# Patient Record
Sex: Male | Born: 1935 | Race: White | Hispanic: No | Marital: Single | Smoking: Former smoker
Health system: Southern US, Community
[De-identification: ages and names within clinical notes are randomized; demographics above are authoritative.]

## PROBLEM LIST (undated history)

## (undated) DIAGNOSIS — I10 Essential (primary) hypertension: Secondary | ICD-10-CM

## (undated) DIAGNOSIS — M6281 Muscle weakness (generalized): Secondary | ICD-10-CM

## (undated) DIAGNOSIS — G9341 Metabolic encephalopathy: Secondary | ICD-10-CM

## (undated) DIAGNOSIS — R2681 Unsteadiness on feet: Secondary | ICD-10-CM

## (undated) DIAGNOSIS — R41841 Cognitive communication deficit: Secondary | ICD-10-CM

## (undated) DIAGNOSIS — B9562 Methicillin resistant Staphylococcus aureus infection as the cause of diseases classified elsewhere: Secondary | ICD-10-CM

## (undated) DIAGNOSIS — F0390 Unspecified dementia without behavioral disturbance: Secondary | ICD-10-CM

## (undated) DIAGNOSIS — R2689 Other abnormalities of gait and mobility: Secondary | ICD-10-CM

## (undated) DIAGNOSIS — R7881 Bacteremia: Secondary | ICD-10-CM

## (undated) DIAGNOSIS — N400 Enlarged prostate without lower urinary tract symptoms: Secondary | ICD-10-CM

## (undated) DIAGNOSIS — U071 COVID-19: Secondary | ICD-10-CM

## (undated) HISTORY — PX: PACEMAKER INSERTION: SHX728

---

## 1998-11-05 ENCOUNTER — Ambulatory Visit (HOSPITAL_COMMUNITY): Admission: RE | Admit: 1998-11-05 | Discharge: 1998-11-05 | Payer: Self-pay | Admitting: Gastroenterology

## 2000-04-16 ENCOUNTER — Emergency Department (HOSPITAL_COMMUNITY): Admission: EM | Admit: 2000-04-16 | Discharge: 2000-04-16 | Payer: Self-pay | Admitting: Emergency Medicine

## 2008-11-24 ENCOUNTER — Emergency Department (HOSPITAL_COMMUNITY): Admission: EM | Admit: 2008-11-24 | Discharge: 2008-11-24 | Payer: Self-pay | Admitting: Emergency Medicine

## 2008-12-11 ENCOUNTER — Inpatient Hospital Stay (HOSPITAL_COMMUNITY): Admission: RE | Admit: 2008-12-11 | Discharge: 2008-12-16 | Payer: Self-pay | Admitting: Specialist

## 2010-05-27 LAB — COMPREHENSIVE METABOLIC PANEL
ALT: 19 U/L (ref 0–53)
Chloride: 91 mEq/L — ABNORMAL LOW (ref 96–112)
GFR calc Af Amer: >60 mL/min (ref >60)
Total Bilirubin: 1.3 mg/dL — ABNORMAL HIGH (ref 0.3–1.2)
Total Protein: 6.9 g/dL (ref 6.0–8.3)

## 2010-05-27 LAB — URINALYSIS, ROUTINE W REFLEX MICROSCOPIC
Bilirubin Urine: NEGATIVE
Bilirubin Urine: NEGATIVE
Glucose, UA: NEGATIVE mg/dL
Glucose, UA: NEGATIVE mg/dL
Glucose, UA: NEGATIVE mg/dL
Hgb urine dipstick: NEGATIVE
Hgb urine dipstick: NEGATIVE
Ketones, ur: 15 mg/dL — AB
Nitrite: NEGATIVE
Nitrite: NEGATIVE
Protein, ur: NEGATIVE mg/dL
Protein, ur: NEGATIVE mg/dL
Protein, ur: NEGATIVE mg/dL
Specific Gravity, Urine: 1.012 (ref 1.005–1.030)
Specific Gravity, Urine: 1.021 (ref 1.005–1.030)
Urobilinogen, UA: 4 mg/dL — ABNORMAL HIGH (ref 0.0–1.0)
Urobilinogen, UA: 4 mg/dL — ABNORMAL HIGH (ref 0.0–1.0)
Urobilinogen, UA: 4 mg/dL — ABNORMAL HIGH (ref 0.0–1.0)
pH: 7 (ref 5.0–8.0)

## 2010-05-27 LAB — BASIC METABOLIC PANEL WITH GFR
BUN: 8 mg/dL (ref 6–23)
CO2: 28 meq/L (ref 19–32)
Calcium: 9.6 mg/dL (ref 8.4–10.5)
Chloride: 95 meq/L — ABNORMAL LOW (ref 96–112)
Creatinine, Ser: 0.84 mg/dL (ref 0.4–1.5)
GFR calc non Af Amer: >60 mL/min
Glucose, Bld: 120 mg/dL — ABNORMAL HIGH (ref 70–99)
Potassium: 2.9 meq/L — ABNORMAL LOW (ref 3.5–5.1)
Sodium: 132 meq/L — ABNORMAL LOW (ref 135–145)

## 2010-05-27 LAB — URINE CULTURE
Colony Count: NO GROWTH
Colony Count: NO GROWTH

## 2010-05-27 LAB — URINE MICROSCOPIC-ADD ON

## 2010-05-27 LAB — CBC
HCT: 41.7 % (ref 39.0–52.0)
Hemoglobin: 14.5 g/dL (ref 13.0–17.0)
Hemoglobin: 15.4 g/dL (ref 13.0–17.0)
MCHC: 34.7 g/dL (ref 30.0–36.0)
MCV: 100.6 fL — ABNORMAL HIGH (ref 78.0–100.0)
MCV: 100.7 fL — ABNORMAL HIGH (ref 78.0–100.0)
Platelets: 285 10*3/uL (ref 150–400)
Platelets: 290 K/uL (ref 150–400)
RBC: 3.81 MIL/uL — ABNORMAL LOW (ref 4.22–5.81)
RBC: 4.15 MIL/uL — ABNORMAL LOW (ref 4.22–5.81)
RBC: 4.18 MIL/uL — ABNORMAL LOW (ref 4.22–5.81)
RBC: 4.5 MIL/uL (ref 4.22–5.81)
RDW: 12.2 % (ref 11.5–15.5)
RDW: 12.2 % (ref 11.5–15.5)
RDW: 12.6 % (ref 11.5–15.5)
WBC: 10.4 10*3/uL (ref 4.0–10.5)
WBC: 11.7 K/uL — ABNORMAL HIGH (ref 4.0–10.5)
WBC: 9.4 10*3/uL (ref 4.0–10.5)

## 2010-05-27 LAB — BASIC METABOLIC PANEL
BUN: 6 mg/dL (ref 6–23)
CO2: 30 mEq/L (ref 19–32)
CO2: 30 mEq/L (ref 19–32)
Calcium: 8.7 mg/dL (ref 8.4–10.5)
Calcium: 9.6 mg/dL (ref 8.4–10.5)
Chloride: 92 mEq/L — ABNORMAL LOW (ref 96–112)
Chloride: 92 mEq/L — ABNORMAL LOW (ref 96–112)
GFR calc Af Amer: >60 mL/min (ref >60)
GFR calc non Af Amer: >60 mL/min (ref >60)
GFR calc non Af Amer: >60 mL/min (ref >60)
Glucose, Bld: 127 mg/dL — ABNORMAL HIGH (ref 70–99)
Glucose, Bld: 127 mg/dL — ABNORMAL HIGH (ref 70–99)
Potassium: 2.9 mEq/L — ABNORMAL LOW (ref 3.5–5.1)
Potassium: 2.9 mEq/L — ABNORMAL LOW (ref 3.5–5.1)
Sodium: 129 mEq/L — ABNORMAL LOW (ref 135–145)

## 2010-05-27 LAB — DIFFERENTIAL
Eosinophils Absolute: 0.1 10*3/uL (ref 0.0–0.7)
Eosinophils Relative: 1 % (ref 0–5)
Lymphs Abs: 1.1 10*3/uL (ref 0.7–4.0)
Neutro Abs: 7 10*3/uL (ref 1.7–7.7)
Neutrophils Relative %: 75 % (ref 43–77)

## 2010-05-27 LAB — PROTIME-INR: Prothrombin Time: 13.2 seconds (ref 11.6–15.2)

## 2010-05-27 LAB — APTT: aPTT: 30 seconds (ref 24–37)

## 2010-05-27 LAB — ETHANOL: Alcohol, Ethyl (B): <5 mg/dL (ref 0–10)

## 2011-05-22 ENCOUNTER — Encounter (HOSPITAL_COMMUNITY): Payer: Self-pay

## 2011-05-22 ENCOUNTER — Emergency Department (HOSPITAL_COMMUNITY): Payer: Medicare Other

## 2011-05-22 ENCOUNTER — Emergency Department (HOSPITAL_COMMUNITY)
Admission: EM | Admit: 2011-05-22 | Discharge: 2011-05-22 | Disposition: A | Discharge status: Home / Self Care | Payer: Medicare Other | Attending: Emergency Medicine | Admitting: Emergency Medicine

## 2011-05-22 DIAGNOSIS — S82891A Other fracture of right lower leg, initial encounter for closed fracture: Secondary | ICD-10-CM

## 2011-05-22 DIAGNOSIS — I10 Essential (primary) hypertension: Secondary | ICD-10-CM | POA: Insufficient documentation

## 2011-05-22 DIAGNOSIS — Y9229 Other specified public building as the place of occurrence of the external cause: Secondary | ICD-10-CM | POA: Insufficient documentation

## 2011-05-22 DIAGNOSIS — S82899A Other fracture of unspecified lower leg, initial encounter for closed fracture: Secondary | ICD-10-CM | POA: Insufficient documentation

## 2011-05-22 DIAGNOSIS — Z87891 Personal history of nicotine dependence: Secondary | ICD-10-CM | POA: Insufficient documentation

## 2011-05-22 DIAGNOSIS — R296 Repeated falls: Secondary | ICD-10-CM | POA: Insufficient documentation

## 2011-05-22 HISTORY — DX: Essential (primary) hypertension: I10

## 2011-05-22 MED ORDER — HYDROCODONE-ACETAMINOPHEN 5-325 MG PO TABS: 1.0000 | ORAL_TABLET | ORAL | Status: AC | PRN

## 2011-05-22 NOTE — Discharge Instructions (Signed)
Take vicodin as prescribed for severe pain.   Do not drive within four hours of taking this medication (may cause drowsiness or confusion).   Ice 2-3 times a day for 15-20 minutes and elevate when possible.  No weight-bearing.  Follow up with Dr. Shelle Iron this week.   You may return to the ER if symptoms worsen or you have any other concerns.   Ankle Fracture A fracture is a break in the bone. A cast or splint is used to protect and keep your injured bone from moving.  HOME CARE INSTRUCTIONS   Use your crutches as directed.   To lessen the swelling, keep the injured leg elevated while sitting or lying down.   Apply ice to the injury for 15 to 20 minutes, 3 to 4 times per day while awake for 2 days. Put the ice in a plastic bag and place a thin towel between the bag of ice and your cast.   If you have a plaster or fiberglass cast:   Do not try to scratch the skin under the cast using sharp or pointed objects.   Check the skin around the cast every day. You may put lotion on any red or sore areas.   Keep your cast dry and clean.   If you have a plaster splint:   Wear the splint as directed.   You may loosen the elastic around the splint if your toes become numb, tingle, or turn cold or blue.   Do not put pressure on any part of your cast or splint; it may break. Rest your cast only on a pillow the first 24 hours until it is fully hardened.   Your cast or splint can be protected during bathing with a plastic bag. Do not lower the cast or splint into water.   Take medications as directed by your caregiver. Only take over-the-counter or prescription medicines for pain, discomfort, or fever as directed by your caregiver.   Do not drive a vehicle until your caregiver specifically tells you it is safe to do so.   If your caregiver has given you a follow-up appointment, it is very important to keep that appointment. Not keeping the appointment could result in a chronic or permanent injury, pain,  and disability. If there is any problem keeping the appointment, you must call back to this facility for assistance.  SEEK IMMEDIATE MEDICAL CARE IF:   Your cast gets damaged or breaks.   You have continued severe pain or more swelling than you did before the cast was put on.   Your skin or toenails below the injury turn blue or gray, or feel cold or numb.   There is a bad smell or new stains and/or purulent (pus like) drainage coming from under the cast.  If you do not have a window in your cast for observing the wound, a discharge or minor bleeding may show up as a stain on the outside of your cast. Report these findings to your caregiver. MAKE SURE YOU:   Understand these instructions.   Will watch your condition.   Will get help right away if you are not doing well or get worse.  Document Released: 02/05/2000 Document Revised: 01/27/2011 Document Reviewed: 09/11/2007 [facility] Patient Information 2012 [place], [place].

## 2011-05-22 NOTE — ED Notes (Signed)
Pt report stepping in a hole twisting rt ankle- no other complaints

## 2011-05-22 NOTE — ED Notes (Signed)
Ortho tech called 

## 2011-05-22 NOTE — ED Provider Notes (Signed)
Medical screening examination/treatment/procedure(s) were performed by non-physician practitioner and as supervising physician I was immediately available for consultation/collaboration.   Dayton Bailiff, MD 05/22/11 2306

## 2011-05-22 NOTE — ED Provider Notes (Signed)
History     CSN: [id number]  Arrival date & time 05/22/11  4401   First MD Initiated Contact with Patient 05/22/11 2028      Chief Complaint  Patient presents with  . Fall  . Ankle Pain    (Consider location/radiation/quality/duration/timing/severity/associated sxs/prior treatment) HPI History provided by pt.   Pt stepped into a hole w/ right foot while walking out of church at 8:30am today and fell, landing on his knees.  Did not hit head and denies neck, back and knee pain.  Has pain at right medial ankle only.  Aggravated by ROM and is unable to bear weight.  Has not taken anything for pain.  No associated paresthesias.  Per prior chart, Dr. Shelle Iron performed ORIF for right bimalleolar fx in 2010.    Past Medical History  Diagnosis Date  . Hypertension     No past surgical history on file.  No family history on file.  History  Substance Use Topics  . Smoking status: Former Games developer  . Smokeless tobacco: Not on file  . Alcohol Use: No      Review of Systems  All other systems reviewed and are negative.    Allergies  Review of patient's allergies indicates no known allergies.  Home Medications  No current outpatient prescriptions on file.  BP 134/71  Pulse 82  Temp(Src) 98.7 F (37.1 C) (Oral)  Resp 18  Ht 6\' 1"  (1.854 m)  Wt 220 lb (99.791 kg)  BMI 29.03 kg/m2  SpO2 100%  Physical Exam  Nursing note and vitals reviewed. Constitutional: He is oriented to person, place, and time. He appears well-developed and well-nourished. No distress.  HENT:  Head: Normocephalic and atraumatic.  Eyes:       Normal appearance  Neck: Normal range of motion.  Musculoskeletal:       Right ankle w/out deformity or ecchymosis.  Edema and tenderness w/ guarding of medial malleolus.  Pain w/ minimal passive ROM.  2+ DP pulse and distal sensation intact.   Neurological: He is alert and oriented to person, place, and time.  Psychiatric: He has a normal mood and affect. His  behavior is normal.    ED Course  Procedures (including critical care time)  Labs Reviewed - No data to display Dg Ankle Complete Right  05/22/2011  *RADIOLOGY REPORT*  Clinical Data: Larey Seat.  History of previous ankle fractures.  RIGHT ANKLE - COMPLETE 3+ VIEW  Comparison: 11/24/2008.  Findings: There is a long side plate and multiple screws on the fibula.  No complicating features or acute fracture.  There is moderate lateral soft tissue swelling.  There is an old healed fracture of the medial malleolus at the level of the ankle mortise. Findings suspicious for a new acute fracture above the mortise with medial mortise widening.  Remote healed calcaneal fractures are again demonstrated.  IMPRESSION:  1.  Remote healed fractures of the medial malleolus, lateral malleolus and calcaneus. 2.  Acute nondisplaced intra-articular fracture of the medial malleolus above the ankle mortise with mild medial mortise widening.  Original Report Authenticated By: P. Loralie Champagne, M.D.     1. Fracture of right ankle       MDM  Pt presents w/ fx of left lateral malleolus secondary to mechanical fall.  Dr. Shelle Iron has performed ORIF of this ankle in the past.  Ortho tech splinted (short leg and stirrup).  Pt will borrow a walker from a friend.  D/c'd home w/ vicodin for pain and referral  back to Dr. Shelle Iron.  Recommended ice, elevation and non-weight-bearing.          Adam Conner, [place] 05/22/11 2111

## 2012-07-21 ENCOUNTER — Emergency Department (HOSPITAL_COMMUNITY): Payer: Medicare PPO

## 2012-07-21 ENCOUNTER — Emergency Department (HOSPITAL_COMMUNITY)
Admission: EM | Admit: 2012-07-21 | Discharge: 2012-07-21 | Disposition: A | Discharge status: Home / Self Care | Payer: Medicare PPO | Attending: Emergency Medicine | Admitting: Emergency Medicine

## 2012-07-21 DIAGNOSIS — I959 Hypotension, unspecified: Secondary | ICD-10-CM | POA: Insufficient documentation

## 2012-07-21 DIAGNOSIS — S0081XA Abrasion of other part of head, initial encounter: Secondary | ICD-10-CM

## 2012-07-21 DIAGNOSIS — R4789 Other speech disturbances: Secondary | ICD-10-CM | POA: Insufficient documentation

## 2012-07-21 DIAGNOSIS — S0990XA Unspecified injury of head, initial encounter: Secondary | ICD-10-CM

## 2012-07-21 DIAGNOSIS — F1020 Alcohol dependence, uncomplicated: Secondary | ICD-10-CM | POA: Insufficient documentation

## 2012-07-21 DIAGNOSIS — Y998 Other external cause status: Secondary | ICD-10-CM | POA: Insufficient documentation

## 2012-07-21 DIAGNOSIS — R296 Repeated falls: Secondary | ICD-10-CM | POA: Insufficient documentation

## 2012-07-21 DIAGNOSIS — I10 Essential (primary) hypertension: Secondary | ICD-10-CM | POA: Insufficient documentation

## 2012-07-21 DIAGNOSIS — F10220 Alcohol dependence with intoxication, uncomplicated: Secondary | ICD-10-CM

## 2012-07-21 DIAGNOSIS — Y929 Unspecified place or not applicable: Secondary | ICD-10-CM | POA: Insufficient documentation

## 2012-07-21 DIAGNOSIS — Z87891 Personal history of nicotine dependence: Secondary | ICD-10-CM | POA: Insufficient documentation

## 2012-07-21 DIAGNOSIS — IMO0002 Reserved for concepts with insufficient information to code with codable children: Secondary | ICD-10-CM | POA: Insufficient documentation

## 2012-07-21 DIAGNOSIS — S60512A Abrasion of left hand, initial encounter: Secondary | ICD-10-CM

## 2012-07-21 DIAGNOSIS — Z23 Encounter for immunization: Secondary | ICD-10-CM | POA: Insufficient documentation

## 2012-07-21 MED ORDER — TETANUS-DIPHTH-ACELL PERTUSSIS 5-2.5-18.5 LF-MCG/0.5 IM SUSP
0.5000 mL | Freq: Once | INTRAMUSCULAR | Status: AC
  Administered 2012-07-21: 0.5 mL via INTRAMUSCULAR
  Filled 2012-07-21: qty 0.5

## 2012-07-21 NOTE — ED Notes (Signed)
Reminded pt not get out of bed without assistance. Bed at lowest point. Call bell in pts hand. Door open. Will continue to monitor.

## 2012-07-21 NOTE — ED Notes (Signed)
Per EMS, pt was walking to go pick up roommate and fell. He did not LOC. Denies any pain. Abrasion  On bridge of nose. Skin tear on forehead and knuckles. CBG 101. BP 87/53, 70. No pain in neck or back per palpation.

## 2012-07-21 NOTE — ED Provider Notes (Signed)
History     CSN: [id number]  Arrival date & time 07/21/12  1628   First MD Initiated Contact with Patient 07/21/12 1700      Chief Complaint  Patient presents with  . Fall  . Alcohol Intoxication    (Consider location/radiation/quality/duration/timing/severity/associated sxs/prior treatment) HPI Comments: Patient with h/o alcoholism had a witnessed fall without LOC per EMS. Patient currently denies any pain except for a head and left hand where he sustained abrasions. Per EMS, patient was hypotensive upon arrival. This improved with fluids. Patient denies blurry vision, nausea, vomiting, abdominal pain. He denies neck pain. Onset of symptoms acute. Course is constant. Nothing makes symptoms better or worse.  The history is provided by the patient and the EMS personnel.    Past Medical History  Diagnosis Date  . Hypertension     No past surgical history on file.  No family history on file.  History  Substance Use Topics  . Smoking status: Former Games developer  . Smokeless tobacco: Not on file  . Alcohol Use: No      Review of Systems  Constitutional: Negative for fatigue.  HENT: Negative for neck pain and tinnitus.   Eyes: Negative for photophobia, pain and visual disturbance.  Respiratory: Negative for shortness of breath.   Cardiovascular: Negative for chest pain.  Gastrointestinal: Negative for nausea and vomiting.  Musculoskeletal: Negative for back pain and gait problem.  Skin: Positive for wound.  Neurological: Negative for dizziness, weakness, light-headedness, numbness and headaches.  Psychiatric/Behavioral: Negative for confusion and decreased concentration.    Allergies  Review of patient's allergies indicates no known allergies.  Home Medications   Current Outpatient Rx  Name  Route  Sig  Dispense  Refill  . PRESCRIPTION MEDICATION   Oral   Take 1 tablet by mouth daily.         Marland Kitchen PRESCRIPTION MEDICATION   Oral   Take 1 tablet by mouth daily.          Marland Kitchen PRESCRIPTION MEDICATION   Oral   Take 1 tablet by mouth daily.         Marland Kitchen PRESCRIPTION MEDICATION   Oral   Take 1 tablet by mouth daily.           There were no vitals taken for this visit.  Physical Exam  Nursing note and vitals reviewed. Constitutional: He is oriented to person, place, and time. He appears well-developed and well-nourished.  HENT:  Head: Normocephalic. Head is without raccoon's eyes and without Battle's sign.  Right Ear: Tympanic membrane, external ear and ear canal normal. No hemotympanum.  Left Ear: Tympanic membrane, external ear and ear canal normal. No hemotympanum.  Nose: Nose normal. No nasal septal hematoma.  Mouth/Throat: Oropharynx is clear and moist. No oropharyngeal exudate.  Scattered abrasions of forehead and bridge of nose with a small avulsion.   Eyes: Conjunctivae, EOM and lids are normal. Pupils are equal, round, and reactive to light.  No visible hyphema  Neck: Normal range of motion. Neck supple.  Cardiovascular: Normal rate and regular rhythm.   Pulmonary/Chest: Effort normal and breath sounds normal.  Abdominal: Soft. There is no tenderness.  Musculoskeletal: Normal range of motion.       Cervical back: He exhibits normal range of motion, no tenderness and no bony tenderness.       Thoracic back: He exhibits no tenderness and no bony tenderness.       Lumbar back: He exhibits no tenderness and no bony tenderness.  Neurological: He is alert and oriented to person, place, and time. He has normal strength and normal reflexes. No cranial nerve deficit or sensory deficit. Coordination normal. GCS eye subscore is 4. GCS verbal subscore is 5. GCS motor subscore is 6.  Slurred speech consistent with alcohol intoxication. Patient is alert.  Skin: Skin is warm and dry.  Mild abrasions to the dorsum of his left hand and fingers.  Psychiatric:  Patient is intoxicated but cooperative.    ED Course  Procedures (including critical care  time)  Labs Reviewed - No data to display Ct Head Wo Contrast  07/21/2012   *RADIOLOGY REPORT*  Clinical Data:  Fall.  Alcohol intoxication. Abrasions to forehead and nose.  CT HEAD WITHOUT CONTRAST CT CERVICAL SPINE WITHOUT CONTRAST  Technique:  Multidetector CT imaging of the head and cervical spine was performed following the standard protocol without intravenous contrast.  Multiplanar CT image reconstructions of the cervical spine were also generated.  Comparison:  None available.  CT HEAD  Findings: A right frontal scalp hematoma is present.  There is no underlying fracture.  This the globes and orbits are intact.  The paranasal sinuses and mastoid air cells are clear.  Moderate generalized atrophy is present.  Periventricular and subcortical white matter hypoattenuation is present bilaterally. This the ventricles are proportionate to the degree of atrophy.  No significant extra-axial fluid collection is present.  IMPRESSION:  1.  Moderate atrophy white matter disease likely reflects the sequelae of chronic microvascular ischemia. 2.  No acute intracranial abnormality. 3.  Right frontal scalp hematoma without an underlying fracture.  CT CERVICAL SPINE  Findings: The cervical spine is imaged from the skull base through T1-2.  There is fusion of anterior osteophytes at C5-6 and C6-7, more prominent on the right.  Chronic end plate degenerative change and uncovertebral spurring is evident from C3-4 through C6-7. Multilevel facet degenerative changes are most evident on the right at C3-4.  Atherosclerotic calcifications are present within the carotid bifurcations bilaterally.  Atherosclerotic calcifications are present in the subclavian arteries bilaterally as well.  The lung apices are clear.  IMPRESSION:  1.  No acute fracture or traumatic subluxation. 2.  Moderate degenerative changes of the cervical spine as described. 3.  Atherosclerosis.   Original Report Authenticated By: Marin Roberts, M.D.   Ct  Cervical Spine Wo Contrast  07/21/2012   *RADIOLOGY REPORT*  Clinical Data:  Fall.  Alcohol intoxication. Abrasions to forehead and nose.  CT HEAD WITHOUT CONTRAST CT CERVICAL SPINE WITHOUT CONTRAST  Technique:  Multidetector CT imaging of the head and cervical spine was performed following the standard protocol without intravenous contrast.  Multiplanar CT image reconstructions of the cervical spine were also generated.  Comparison:  None available.  CT HEAD  Findings: A right frontal scalp hematoma is present.  There is no underlying fracture.  This the globes and orbits are intact.  The paranasal sinuses and mastoid air cells are clear.  Moderate generalized atrophy is present.  Periventricular and subcortical white matter hypoattenuation is present bilaterally. This the ventricles are proportionate to the degree of atrophy.  No significant extra-axial fluid collection is present.  IMPRESSION:  1.  Moderate atrophy white matter disease likely reflects the sequelae of chronic microvascular ischemia. 2.  No acute intracranial abnormality. 3.  Right frontal scalp hematoma without an underlying fracture.  CT CERVICAL SPINE  Findings: The cervical spine is imaged from the skull base through T1-2.  There is fusion of anterior osteophytes at  C5-6 and C6-7, more prominent on the right.  Chronic end plate degenerative change and uncovertebral spurring is evident from C3-4 through C6-7. Multilevel facet degenerative changes are most evident on the right at C3-4.  Atherosclerotic calcifications are present within the carotid bifurcations bilaterally.  Atherosclerotic calcifications are present in the subclavian arteries bilaterally as well.  The lung apices are clear.  IMPRESSION:  1.  No acute fracture or traumatic subluxation. 2.  Moderate degenerative changes of the cervical spine as described. 3.  Atherosclerosis.   Original Report Authenticated By: Marin Roberts, M.D.     1. Head injury, acute, initial  encounter   2. Facial abrasion, initial encounter   3. Hand abrasion, left, initial encounter   4. Alcohol intoxication in active alcoholic, uncomplicated     5:14 PM Patient seen and examined. Work-up initiated. Medications ordered.   Vital signs reviewed and are as follows: Filed Vitals:   07/21/12 1745  BP: 103/55  Pulse: 61   6:49 PM CT scans reviewed by myself. They are negative.  Patient was counseled on head injury precautions and symptoms that should indicate their return to the ED.  These include severe worsening headache, vision changes, confusion, loss of consciousness, trouble walking, nausea & vomiting, or weakness/tingling in extremities.    Pt urged to return with worsening pain, worsening swelling, expanding area of redness or streaking up extremity, fever, or any other concerns. Pt verbalizes understanding and agrees with plan.  Friends are at bedside and will take patient home. They were also given return instructions.    MDM  Head injury and abrasions after fall due to alcohol intoxication. No wounds requiring suture or other repair. Head CT and neck CT performed showing no acute injuries.         Renne Crigler, PA-C 07/21/12 [phone]

## 2012-07-21 NOTE — ED Notes (Signed)
Rechecked HR and it 56 manually. Will continue to monitor.

## 2012-07-21 NOTE — ED Provider Notes (Signed)
Medical screening examination/treatment/procedure(s) were conducted as a shared visit with non-physician practitioner(s) and myself.  I personally evaluated the patient during the encounter  Pt with fall, is intoxicated.  Has remained awake, conversant.  Non focal neuro deficits, no N/V here.  Will obtain head and cervical spine CT's, perform basic wound care for multiple facial abrasions. If CT's reveal no sig trauma, will be able to discharge to home with family.      Gavin Pound. Oletta Lamas, MD 07/21/12 1610

## 2020-03-11 ENCOUNTER — Other Ambulatory Visit: Payer: Self-pay

## 2020-03-11 ENCOUNTER — Encounter (HOSPITAL_COMMUNITY): Payer: Self-pay

## 2020-03-11 ENCOUNTER — Emergency Department (HOSPITAL_COMMUNITY)
Admission: EM | Admit: 2020-03-11 | Discharge: 2020-03-11 | Disposition: A | Discharge status: Home / Self Care | Payer: Medicare PPO | Attending: Emergency Medicine | Admitting: Emergency Medicine

## 2020-03-11 ENCOUNTER — Emergency Department (HOSPITAL_COMMUNITY): Payer: Medicare PPO

## 2020-03-11 DIAGNOSIS — U071 COVID-19: Secondary | ICD-10-CM

## 2020-03-11 DIAGNOSIS — Z79899 Other long term (current) drug therapy: Secondary | ICD-10-CM | POA: Diagnosis not present

## 2020-03-11 DIAGNOSIS — I10 Essential (primary) hypertension: Secondary | ICD-10-CM | POA: Insufficient documentation

## 2020-03-11 DIAGNOSIS — R0981 Nasal congestion: Secondary | ICD-10-CM | POA: Diagnosis present

## 2020-03-11 DIAGNOSIS — Z87891 Personal history of nicotine dependence: Secondary | ICD-10-CM | POA: Insufficient documentation

## 2020-03-11 DIAGNOSIS — Z7982 Long term (current) use of aspirin: Secondary | ICD-10-CM | POA: Diagnosis not present

## 2020-03-11 DIAGNOSIS — F0390 Unspecified dementia without behavioral disturbance: Secondary | ICD-10-CM | POA: Diagnosis not present

## 2020-03-11 NOTE — ED Notes (Signed)
[facility] reports he is #4 on their list

## 2020-03-11 NOTE — ED Notes (Signed)
Called pt daughter on file to pick pt up. Pt daughter states that she lives in [place]. She requests that pt be transported by ambulance. [facility] called to transport pt back home

## 2020-03-11 NOTE — Discharge Instructions (Addendum)
You have been evaluated for possible complications from COVID infection.  Fortunately your chest x-ray is normal today, oxygen level is normal as well.  Please follow instruction below for further care.  Recommendations for at home COVID-19 symptoms management:  Please continue isolation at home. Call [phone] to see whether you might be eligible for therapeutic antibody infusions (leave your name and they will call you back).  If have acute worsening of symptoms please go to ER/urgent care for further evaluation. Check pulse oximetry and if below 90-92% please go to ER. The following supplements MAY help:  Vitamin C 500mg  twice a day and Quercetin 250-500 mg twice a day Vitamin D3 2000 - 4000 u/day B Complex vitamins Zinc 75-100 mg/day Melatonin 6-10 mg at night (the optimal dose is unknown) Aspirin 81mg /day (if no history of bleeding issues)

## 2020-03-11 NOTE — ED Notes (Signed)
[facility] has been called for transport. 

## 2020-03-11 NOTE — ED Triage Notes (Signed)
Per EMS, Pt, from home, after family felt they heard fluid in his lungs.  Pt denies complaints.  Pt is COVID+ x 5 days.  Lungs were clear w/ EMS.  Some congestion noted.  Vitals WDL.  Pt is A & Ox2 at baseline. Hx of dementia.

## 2020-03-11 NOTE — ED Provider Notes (Signed)
[facility]-EMERGENCY DEPT Provider Note   CSN: [id number] Arrival date & time: 03/11/20  1042     History Chief Complaint  Patient presents with  . Covid Positive  . Nasal Congestion    Adam Conner is a 87 y.o. male.  The history is provided by the patient, the EMS personnel and medical records. No language interpreter was used.     87 year old male with history of hypertension, dementia, brought here via EMS from home with concerns of COVID symptoms.  Per EMS note, patient was diagnosed with COVID for the past 5 days.  And family member heard "fluid in his lungs" and was concerned.  However, when questioned, patient at this time does not have any complaint.  He does not complain of any fever chills no cough no trouble breathing no pain in his chest no nauseous or vomiting no loss of taste or smell.  He does not have any specific complaint.  He is unsure if he has been vaccinated or not.  Past Medical History:  Diagnosis Date  . Hypertension     There are no problems to display for this patient.    The histories are not reviewed yet. Please review them in the "History" navigator section and refresh this SmartLink.     No family history on file.  Social History   Tobacco Use  . Smoking status: Former Smoker  Substance Use Topics  . Alcohol use: No  . Drug use: No    Home Medications Prior to Admission medications   Medication Sig Start Date End Date Taking? Authorizing Provider  aspirin EC 81 MG tablet Take 81 mg by mouth daily.    [provider]  lisinopril (PRINIVIL,ZESTRIL) 5 MG tablet Take 5 mg by mouth daily.    [provider]  PRESCRIPTION MEDICATION Take 1 tablet by mouth daily.    [provider]  PRESCRIPTION MEDICATION Take 1 tablet by mouth daily.    [provider]  PRESCRIPTION MEDICATION Take 1 tablet by mouth daily.    [provider]  PRESCRIPTION MEDICATION Take 1 tablet by mouth  daily.    [provider]  tamsulosin (FLOMAX) 0.4 MG CAPS Take 0.4 mg by mouth daily after breakfast.    [provider]    Allergies    Patient has no known allergies.  Review of Systems   Review of Systems  Unable to perform ROS: Dementia    Physical Exam Updated Vital Signs BP (!) 162/86 (BP Location: Left Arm)   Pulse 79   Temp 98.2 F (36.8 C) (Oral)   Resp 16   SpO2 98%   Physical Exam Vitals and nursing note reviewed.  Constitutional:      General: He is not in acute distress.    Appearance: He is well-developed and well-nourished. He is obese.     Comments: Elderly male sitting in the chair appears to be in no acute discomfort  HENT:     Head: Atraumatic.  Eyes:     Conjunctiva/sclera: Conjunctivae normal.  Cardiovascular:     Rate and Rhythm: Normal rate and regular rhythm.     Pulses: Normal pulses.     Heart sounds: Normal heart sounds.  Pulmonary:     Effort: Pulmonary effort is normal.     Breath sounds: Normal breath sounds. No wheezing, rhonchi or rales.  Abdominal:     Palpations: Abdomen is soft.     Tenderness: There is no abdominal tenderness.  Musculoskeletal:     Cervical back: Neck supple.  Skin:    Findings: No rash.  Neurological:     Mental Status: He is alert.     GCS: GCS eye subscore is 4. GCS verbal subscore is 5. GCS motor subscore is 6.     Motor: Motor function is intact.     Comments: Alert to self, month but unable to tell place, year, or situation.  Psychiatric:        Mood and Affect: Mood and affect normal.     ED Results / Procedures / Treatments   Labs (all labs ordered are listed, but only abnormal results are displayed) Labs Reviewed - No data to display  EKG None  Radiology DG Chest Portable 1 View  Result Date: 03/11/2020 CLINICAL DATA:  Congestion. The patient tested positive for COVID-19. EXAM: PORTABLE CHEST 1 VIEW COMPARISON:  PA and lateral chest 12/10/2008. FINDINGS: Lung volumes are  low with mild basilar atelectasis. No consolidative process, pneumothorax or effusion. Heart size is normal. Aortic atherosclerosis is noted. IMPRESSION: No acute disease in a low volume chest. Aortic Atherosclerosis (ICD10-I70.0). Electronically Signed   By: Drusilla Kanner M.D.   On: 03/11/2020 11:39    Procedures Procedures (including critical care time)  Medications Ordered in ED Medications - No data to display  ED Course  I have reviewed the triage vital signs and the nursing notes.  Pertinent labs & imaging results that were available during my care of the patient were reviewed by me and considered in my medical decision making (see chart for details).    MDM Rules/Calculators/A&P                          BP (!) 162/86 (BP Location: Left Arm)   Pulse 79   Temp 98.2 F (36.8 C) (Oral)   Resp 16   SpO2 98%   Final Clinical Impression(s) / ED Diagnoses Final diagnoses:  COVID-19 virus infection    Rx / DC Orders ED Discharge Orders    None     11:36 AM Please report the patient previously test positive for COVID infection several days prior sent here because family members concerned that he is having trouble breathing because that was sounds in his lungs.  On exam, patient is well-appearing in no acute discomfort and speaking in complete sentences and does not appear to be having any respiratory distress.  Lungs are clear on auscultation.  Vital signs stable, no hypoxia.  No COVID test result in our system.  Chest x-ray ordered.  11:49 AM Chest x-ray obtained today show no acute finding.  I did attempt to reach out to patient's family member through the phone without any success.  However, in the event of a normal chest x-ray, no hypoxia, and patient is well-appearing, he is stable for discharge.  I did provide post COVID care instruction. Return precaution given.   Adam Conner was evaluated in Emergency Department on 03/11/2020 for the symptoms described in the history  of present illness. He was evaluated in the context of the global COVID-19 pandemic, which necessitated consideration that the patient might be at risk for infection with the SARS-CoV-2 virus that causes COVID-19. Institutional protocols and algorithms that pertain to the evaluation of patients at risk for COVID-19 are in a state of rapid change based on information released by regulatory bodies including the CDC and federal and state organizations. These policies and algorithms were followed  during the patient's care in the ED.    Fayrene Helper, PA-C 03/11/20 1159    Arby Barrette, MD 03/11/20 [phone]

## 2020-04-16 ENCOUNTER — Encounter (HOSPITAL_COMMUNITY): Payer: Self-pay | Admitting: Emergency Medicine

## 2020-04-16 ENCOUNTER — Other Ambulatory Visit: Payer: Self-pay

## 2020-04-16 ENCOUNTER — Inpatient Hospital Stay (HOSPITAL_COMMUNITY)
Admission: EM | Admit: 2020-04-16 | Discharge: 2020-04-24 | DRG: 314 | Disposition: A | Discharge status: Skilled Nursing Facility | Payer: Medicare PPO | Attending: Internal Medicine | Admitting: Internal Medicine

## 2020-04-16 ENCOUNTER — Emergency Department (HOSPITAL_COMMUNITY): Payer: Medicare PPO

## 2020-04-16 DIAGNOSIS — A419 Sepsis, unspecified organism: Secondary | ICD-10-CM | POA: Diagnosis not present

## 2020-04-16 DIAGNOSIS — Z6834 Body mass index (BMI) 34.0-34.9, adult: Secondary | ICD-10-CM

## 2020-04-16 DIAGNOSIS — R6520 Severe sepsis without septic shock: Secondary | ICD-10-CM | POA: Diagnosis not present

## 2020-04-16 DIAGNOSIS — Z7982 Long term (current) use of aspirin: Secondary | ICD-10-CM

## 2020-04-16 DIAGNOSIS — N400 Enlarged prostate without lower urinary tract symptoms: Secondary | ICD-10-CM | POA: Diagnosis present

## 2020-04-16 DIAGNOSIS — L97529 Non-pressure chronic ulcer of other part of left foot with unspecified severity: Secondary | ICD-10-CM | POA: Diagnosis present

## 2020-04-16 DIAGNOSIS — N3000 Acute cystitis without hematuria: Secondary | ICD-10-CM

## 2020-04-16 DIAGNOSIS — R739 Hyperglycemia, unspecified: Secondary | ICD-10-CM | POA: Diagnosis present

## 2020-04-16 DIAGNOSIS — F10230 Alcohol dependence with withdrawal, uncomplicated: Secondary | ICD-10-CM

## 2020-04-16 DIAGNOSIS — E669 Obesity, unspecified: Secondary | ICD-10-CM | POA: Diagnosis present

## 2020-04-16 DIAGNOSIS — Z79899 Other long term (current) drug therapy: Secondary | ICD-10-CM

## 2020-04-16 DIAGNOSIS — Z7901 Long term (current) use of anticoagulants: Secondary | ICD-10-CM

## 2020-04-16 DIAGNOSIS — T827XXA Infection and inflammatory reaction due to other cardiac and vascular devices, implants and grafts, initial encounter: Principal | ICD-10-CM

## 2020-04-16 DIAGNOSIS — B964 Proteus (mirabilis) (morganii) as the cause of diseases classified elsewhere: Secondary | ICD-10-CM | POA: Diagnosis present

## 2020-04-16 DIAGNOSIS — R7881 Bacteremia: Secondary | ICD-10-CM

## 2020-04-16 DIAGNOSIS — Y831 Surgical operation with implant of artificial internal device as the cause of abnormal reaction of the patient, or of later complication, without mention of misadventure at the time of the procedure: Secondary | ICD-10-CM | POA: Diagnosis present

## 2020-04-16 DIAGNOSIS — I480 Paroxysmal atrial fibrillation: Secondary | ICD-10-CM | POA: Diagnosis present

## 2020-04-16 DIAGNOSIS — U071 COVID-19: Secondary | ICD-10-CM | POA: Diagnosis present

## 2020-04-16 DIAGNOSIS — E872 Acidosis, unspecified: Secondary | ICD-10-CM | POA: Diagnosis present

## 2020-04-16 DIAGNOSIS — Z87891 Personal history of nicotine dependence: Secondary | ICD-10-CM

## 2020-04-16 DIAGNOSIS — B9562 Methicillin resistant Staphylococcus aureus infection as the cause of diseases classified elsewhere: Secondary | ICD-10-CM

## 2020-04-16 DIAGNOSIS — I10 Essential (primary) hypertension: Secondary | ICD-10-CM | POA: Diagnosis present

## 2020-04-16 DIAGNOSIS — Z950 Presence of cardiac pacemaker: Secondary | ICD-10-CM | POA: Diagnosis present

## 2020-04-16 DIAGNOSIS — E876 Hypokalemia: Secondary | ICD-10-CM | POA: Diagnosis present

## 2020-04-16 DIAGNOSIS — R8271 Bacteriuria: Secondary | ICD-10-CM | POA: Diagnosis present

## 2020-04-16 DIAGNOSIS — E11621 Type 2 diabetes mellitus with foot ulcer: Secondary | ICD-10-CM

## 2020-04-16 DIAGNOSIS — A4102 Sepsis due to Methicillin resistant Staphylococcus aureus: Secondary | ICD-10-CM | POA: Diagnosis present

## 2020-04-16 DIAGNOSIS — I714 Abdominal aortic aneurysm, without rupture: Secondary | ICD-10-CM | POA: Diagnosis present

## 2020-04-16 DIAGNOSIS — E785 Hyperlipidemia, unspecified: Secondary | ICD-10-CM | POA: Diagnosis present

## 2020-04-16 DIAGNOSIS — E869 Volume depletion, unspecified: Secondary | ICD-10-CM | POA: Diagnosis present

## 2020-04-16 DIAGNOSIS — G9341 Metabolic encephalopathy: Secondary | ICD-10-CM | POA: Diagnosis present

## 2020-04-16 DIAGNOSIS — E1165 Type 2 diabetes mellitus with hyperglycemia: Secondary | ICD-10-CM | POA: Diagnosis present

## 2020-04-16 DIAGNOSIS — K8020 Calculus of gallbladder without cholecystitis without obstruction: Secondary | ICD-10-CM | POA: Diagnosis present

## 2020-04-16 DIAGNOSIS — F0390 Unspecified dementia without behavioral disturbance: Secondary | ICD-10-CM | POA: Diagnosis present

## 2020-04-16 DIAGNOSIS — Z95828 Presence of other vascular implants and grafts: Secondary | ICD-10-CM

## 2020-04-16 LAB — URINALYSIS, ROUTINE W REFLEX MICROSCOPIC
Bilirubin Urine: NEGATIVE
Glucose, UA: NEGATIVE mg/dL
Ketones, ur: NEGATIVE mg/dL
Nitrite: NEGATIVE
Protein, ur: 30 mg/dL — AB
Specific Gravity, Urine: 1.017 (ref 1.005–1.030)
WBC, UA: >50 WBC/hpf — ABNORMAL HIGH (ref 0–5)
pH: 8 (ref 5.0–8.0)

## 2020-04-16 LAB — CBC WITH DIFFERENTIAL/PLATELET
Abs Immature Granulocytes: 0.22 10*3/uL — ABNORMAL HIGH (ref 0.00–0.07)
Basophils Absolute: 0 10*3/uL (ref 0.0–0.1)
Basophils Relative: 0 %
Eosinophils Absolute: 0 10*3/uL (ref 0.0–0.5)
Eosinophils Relative: 0 %
HCT: 42.7 % (ref 39.0–52.0)
Hemoglobin: 13.6 g/dL (ref 13.0–17.0)
Immature Granulocytes: 1 %
Lymphocytes Relative: 5 %
Lymphs Abs: 1.2 10*3/uL (ref 0.7–4.0)
MCH: 28.9 pg (ref 26.0–34.0)
MCHC: 31.9 g/dL (ref 30.0–36.0)
MCV: 90.7 fL (ref 80.0–100.0)
Monocytes Absolute: 3.1 10*3/uL — ABNORMAL HIGH (ref 0.1–1.0)
Monocytes Relative: 14 %
Neutro Abs: 18.3 10*3/uL — ABNORMAL HIGH (ref 1.7–7.7)
Neutrophils Relative %: 80 %
Platelets: 266 10*3/uL (ref 150–400)
RBC: 4.71 MIL/uL (ref 4.22–5.81)
RDW: 13.4 % (ref 11.5–15.5)
WBC: 22.9 10*3/uL — ABNORMAL HIGH (ref 4.0–10.5)
nRBC: 0 % (ref 0.0–0.2)

## 2020-04-16 LAB — COMPREHENSIVE METABOLIC PANEL
ALT: 16 U/L (ref 0–44)
AST: 25 U/L (ref 15–41)
Albumin: 3.1 g/dL — ABNORMAL LOW (ref 3.5–5.0)
Alkaline Phosphatase: 73 U/L (ref 38–126)
Anion gap: 9 (ref 5–15)
BUN: 26 mg/dL — ABNORMAL HIGH (ref 8–23)
CO2: 23 mmol/L (ref 22–32)
Calcium: 8.8 mg/dL — ABNORMAL LOW (ref 8.9–10.3)
Chloride: 100 mmol/L (ref 98–111)
Creatinine, Ser: 1.4 mg/dL — ABNORMAL HIGH (ref 0.61–1.24)
GFR, Estimated: 50 mL/min — ABNORMAL LOW (ref >60)
Glucose, Bld: 169 mg/dL — ABNORMAL HIGH (ref 70–99)
Potassium: 3.6 mmol/L (ref 3.5–5.1)
Sodium: 132 mmol/L — ABNORMAL LOW (ref 135–145)
Total Bilirubin: 1.7 mg/dL — ABNORMAL HIGH (ref 0.3–1.2)
Total Protein: 6.6 g/dL (ref 6.5–8.1)

## 2020-04-16 LAB — PROTIME-INR
INR: 1.5 — ABNORMAL HIGH (ref 0.8–1.2)
Prothrombin Time: 17.4 seconds — ABNORMAL HIGH (ref 11.4–15.2)

## 2020-04-16 LAB — LACTIC ACID, PLASMA
Lactic Acid, Venous: 2.7 mmol/L (ref 0.5–1.9)
Lactic Acid, Venous: 1.8 mmol/L (ref 0.5–1.9)

## 2020-04-16 LAB — APTT: aPTT: 43 seconds — ABNORMAL HIGH (ref 24–36)

## 2020-04-16 MED ORDER — SODIUM CHLORIDE 0.9 % IV SOLN
1.0000 g | INTRAVENOUS | Status: DC
  Administered 2020-04-16: 1 g via INTRAVENOUS
  Filled 2020-04-16: qty 10

## 2020-04-16 MED ORDER — LACTATED RINGERS IV SOLN: INTRAVENOUS | Status: DC

## 2020-04-16 MED ORDER — LACTATED RINGERS IV BOLUS (SEPSIS)
1000.0000 mL | Freq: Once | INTRAVENOUS | Status: AC
  Administered 2020-04-16 – 2020-04-17 (×2): 1000 mL via INTRAVENOUS

## 2020-04-16 NOTE — ED Triage Notes (Signed)
Patient arrived with EMS from home son reported upper abdominal pain with fatigue/generalized weakness/confused and fever at arrival , history of pacemaker / dementia .

## 2020-04-16 NOTE — ED Provider Notes (Signed)
[facility] EMERGENCY DEPARTMENT Provider Note   CSN: [id number] Arrival date & time: 04/16/20  2044     History Chief Complaint  Patient presents with  . Abdominal Pain /Fatigue/Fever    Adam Conner is a 87 y.o. male.  HPI   87 year old male with past medical history of HTN, pacemaker in place, dementia presents the emergency department for reported fatigue/weakness/confusion and fever.  Patient is pleasantly confused, he is able to provide some history but otherwise does not seem reliable.  Currently he denies any active complaints.  Past Medical History:  Diagnosis Date  . Hypertension     There are no problems to display for this patient.   History reviewed. No pertinent surgical history.     No family history on file.  Social History   Tobacco Use  . Smoking status: Former Games developer  . Smokeless tobacco: Never Used  Substance Use Topics  . Alcohol use: No  . Drug use: No    Home Medications Prior to Admission medications   Medication Sig Start Date End Date Taking? Authorizing Provider  aspirin EC 81 MG tablet Take 81 mg by mouth daily.    [provider]  lisinopril (PRINIVIL,ZESTRIL) 5 MG tablet Take 5 mg by mouth daily.    [provider]  PRESCRIPTION MEDICATION Take 1 tablet by mouth daily.    [provider]  PRESCRIPTION MEDICATION Take 1 tablet by mouth daily.    [provider]  PRESCRIPTION MEDICATION Take 1 tablet by mouth daily.    [provider]  PRESCRIPTION MEDICATION Take 1 tablet by mouth daily.    [provider]  tamsulosin (FLOMAX) 0.4 MG CAPS Take 0.4 mg by mouth daily after breakfast.    [provider]    Allergies    Patient has no known allergies.  Review of Systems   Review of Systems  Unable to perform ROS: Dementia    Physical Exam Updated Vital Signs BP 136/84 (BP Location: Left Arm)   Pulse 86   Temp 98.5 F (36.9 C) (Oral)   Resp 16    Ht 5\' 10"  (1.778 m)   Wt 110 kg   SpO2 100%   BMI 34.80 kg/m   Physical Exam Vitals and nursing note reviewed.  Constitutional:      Appearance: Normal appearance.  HENT:     Head: Normocephalic.     Mouth/Throat:     Mouth: Mucous membranes are moist.  Cardiovascular:     Rate and Rhythm: Normal rate.  Pulmonary:     Effort: Pulmonary effort is normal. No respiratory distress.  Abdominal:     Palpations: Abdomen is soft.     Tenderness: There is no abdominal tenderness.  Genitourinary:    Comments: Foul-smelling cloudy urine Musculoskeletal:        General: No deformity.  Skin:    General: Skin is warm.  Neurological:     Mental Status: He is alert.  Psychiatric:        Mood and Affect: Mood normal.     ED Results / Procedures / Treatments   Labs (all labs ordered are listed, but only abnormal results are displayed) Labs Reviewed  LACTIC ACID, PLASMA - Abnormal; Notable for the following components:      Result Value   Lactic Acid, Venous 2.7 (*)    All other components within normal limits  COMPREHENSIVE METABOLIC PANEL - Abnormal; Notable for the following components:   Sodium 132 (*)  Glucose, Bld 169 (*)    BUN 26 (*)    Creatinine, Ser 1.40 (*)    Calcium 8.8 (*)    Albumin 3.1 (*)    Total Bilirubin 1.7 (*)    GFR, Estimated 50 (*)    All other components within normal limits  CBC WITH DIFFERENTIAL/PLATELET - Abnormal; Notable for the following components:   WBC 22.9 (*)    Neutro Abs 18.3 (*)    Monocytes Absolute 3.1 (*)    Abs Immature Granulocytes 0.22 (*)    All other components within normal limits  PROTIME-INR - Abnormal; Notable for the following components:   Prothrombin Time 17.4 (*)    INR 1.5 (*)    All other components within normal limits  APTT - Abnormal; Notable for the following components:   aPTT 43 (*)    All other components within normal limits  URINALYSIS, ROUTINE W REFLEX MICROSCOPIC - Abnormal; Notable for the  following components:   Color, Urine AMBER (*)    APPearance CLOUDY (*)    Hgb urine dipstick SMALL (*)    Protein, ur 30 (*)    Leukocytes,Ua LARGE (*)    WBC, UA >50 (*)    Bacteria, UA RARE (*)    All other components within normal limits  CULTURE, BLOOD (SINGLE)  URINE CULTURE  RESP PANEL BY RT-PCR (FLU A&B, COVID) [id number]  LACTIC ACID, PLASMA  PATHOLOGIST SMEAR REVIEW    EKG EKG Interpretation  Date/Time:  Thursday April 16 2020 20:55:49 EST Ventricular Rate:  84 PR Interval:    QRS Duration: 156 QT Interval:  430 QTC Calculation: 509 R Axis:   -76 Text Interpretation: Sinus or ectopic atrial rhythm Short PR interval Nonspecific IVCD with LAD Left ventricular hypertrophy Inferior infarct, acute (RCA) Anterolateral infarct, old Probable RV involvement, suggest recording right precordial leads >>> Acute MI <<< Wide QRS, probable LBBB, no Sgarbossa criteria, changed from previous Confirmed by Coralee Pesa [id number]) on 04/16/2020 9:00:42 PM   Radiology DG Chest Port 1 View  Result Date: 04/16/2020 CLINICAL DATA:  Upper abdominal pain, fatigue, sepsis EXAM: PORTABLE CHEST 1 VIEW COMPARISON:  03/11/2020 FINDINGS: Single frontal view of the chest demonstrates stable single lead pacer. Cardiac silhouette is enlarged but stable. Continued central vascular congestion without airspace disease, effusion, or pneumothorax. IMPRESSION: 1. Chronic central vascular congestion.  No acute airspace disease. Electronically Signed   By: Sharlet Salina M.D.   On: 04/16/2020 21:35    Procedures Procedures   Medications Ordered in ED Medications  lactated ringers infusion (has no administration in time range)  cefTRIAXone (ROCEPHIN) 1 g in sodium chloride 0.9 % 100 mL IVPB (has no administration in time range)    ED Course  I have reviewed the triage vital signs and the nursing notes.  Pertinent labs & imaging results that were available during my care of the patient were reviewed by me  and considered in my medical decision making (see chart for details).    MDM Rules/Calculators/A&P                          87 year old male presents the emergency department with concern for fever, weakness and confusion.  He is pleasantly demented, able to give some history but does not appear to be reliable.  We do not have an appropriate med list for the patient currently.  He was febrile on arrival, unclear if EMS gave him an antipyretic but the fever went  away with no medication from [place].  Blood pressure is stable.  Blood work shows a leukocytosis, mildly elevated lactic of 2.7, chest x-ray shows mild congestion, urinalysis suspicious for UTI.  Covid is pending.  Patient will be covered for UTI, fluids were ordered but I did not do the 30 cc/kg given his age, lack of hypotension and possible cardiac history/CHF.  Patient admitted to hospitalist.  Final Clinical Impression(s) / ED Diagnoses Final diagnoses:  None    Rx / DC Orders ED Discharge Orders    None       Rozelle Logan, DO 04/16/20 2352

## 2020-04-17 ENCOUNTER — Observation Stay (HOSPITAL_COMMUNITY): Payer: Medicare PPO

## 2020-04-17 ENCOUNTER — Observation Stay (HOSPITAL_BASED_OUTPATIENT_CLINIC_OR_DEPARTMENT_OTHER): Payer: Medicare PPO

## 2020-04-17 DIAGNOSIS — Z6834 Body mass index (BMI) 34.0-34.9, adult: Secondary | ICD-10-CM | POA: Diagnosis not present

## 2020-04-17 DIAGNOSIS — R7881 Bacteremia: Secondary | ICD-10-CM | POA: Diagnosis not present

## 2020-04-17 DIAGNOSIS — T827XXA Infection and inflammatory reaction due to other cardiac and vascular devices, implants and grafts, initial encounter: Secondary | ICD-10-CM | POA: Diagnosis present

## 2020-04-17 DIAGNOSIS — F0390 Unspecified dementia without behavioral disturbance: Secondary | ICD-10-CM | POA: Diagnosis present

## 2020-04-17 DIAGNOSIS — E869 Volume depletion, unspecified: Secondary | ICD-10-CM | POA: Diagnosis present

## 2020-04-17 DIAGNOSIS — I10 Essential (primary) hypertension: Secondary | ICD-10-CM | POA: Diagnosis present

## 2020-04-17 DIAGNOSIS — R8271 Bacteriuria: Secondary | ICD-10-CM | POA: Diagnosis present

## 2020-04-17 DIAGNOSIS — A419 Sepsis, unspecified organism: Secondary | ICD-10-CM | POA: Diagnosis present

## 2020-04-17 DIAGNOSIS — I480 Paroxysmal atrial fibrillation: Secondary | ICD-10-CM | POA: Diagnosis present

## 2020-04-17 DIAGNOSIS — A4102 Sepsis due to Methicillin resistant Staphylococcus aureus: Secondary | ICD-10-CM | POA: Diagnosis present

## 2020-04-17 DIAGNOSIS — R6520 Severe sepsis without septic shock: Secondary | ICD-10-CM | POA: Diagnosis not present

## 2020-04-17 DIAGNOSIS — R739 Hyperglycemia, unspecified: Secondary | ICD-10-CM

## 2020-04-17 DIAGNOSIS — E669 Obesity, unspecified: Secondary | ICD-10-CM | POA: Diagnosis present

## 2020-04-17 DIAGNOSIS — Z7982 Long term (current) use of aspirin: Secondary | ICD-10-CM | POA: Diagnosis not present

## 2020-04-17 DIAGNOSIS — B9562 Methicillin resistant Staphylococcus aureus infection as the cause of diseases classified elsewhere: Secondary | ICD-10-CM | POA: Diagnosis not present

## 2020-04-17 DIAGNOSIS — U071 COVID-19: Secondary | ICD-10-CM | POA: Diagnosis present

## 2020-04-17 DIAGNOSIS — E1165 Type 2 diabetes mellitus with hyperglycemia: Secondary | ICD-10-CM | POA: Diagnosis present

## 2020-04-17 DIAGNOSIS — N3000 Acute cystitis without hematuria: Secondary | ICD-10-CM

## 2020-04-17 DIAGNOSIS — K8020 Calculus of gallbladder without cholecystitis without obstruction: Secondary | ICD-10-CM | POA: Diagnosis present

## 2020-04-17 DIAGNOSIS — L97529 Non-pressure chronic ulcer of other part of left foot with unspecified severity: Secondary | ICD-10-CM | POA: Diagnosis present

## 2020-04-17 DIAGNOSIS — E11621 Type 2 diabetes mellitus with foot ulcer: Secondary | ICD-10-CM | POA: Diagnosis present

## 2020-04-17 DIAGNOSIS — I714 Abdominal aortic aneurysm, without rupture: Secondary | ICD-10-CM | POA: Diagnosis present

## 2020-04-17 DIAGNOSIS — E872 Acidosis, unspecified: Secondary | ICD-10-CM | POA: Diagnosis present

## 2020-04-17 DIAGNOSIS — B964 Proteus (mirabilis) (morganii) as the cause of diseases classified elsewhere: Secondary | ICD-10-CM | POA: Diagnosis present

## 2020-04-17 DIAGNOSIS — F10230 Alcohol dependence with withdrawal, uncomplicated: Secondary | ICD-10-CM | POA: Diagnosis not present

## 2020-04-17 DIAGNOSIS — Z950 Presence of cardiac pacemaker: Secondary | ICD-10-CM | POA: Diagnosis present

## 2020-04-17 DIAGNOSIS — E785 Hyperlipidemia, unspecified: Secondary | ICD-10-CM | POA: Diagnosis present

## 2020-04-17 DIAGNOSIS — Y831 Surgical operation with implant of artificial internal device as the cause of abnormal reaction of the patient, or of later complication, without mention of misadventure at the time of the procedure: Secondary | ICD-10-CM | POA: Diagnosis present

## 2020-04-17 DIAGNOSIS — I5021 Acute systolic (congestive) heart failure: Secondary | ICD-10-CM | POA: Diagnosis not present

## 2020-04-17 DIAGNOSIS — N400 Enlarged prostate without lower urinary tract symptoms: Secondary | ICD-10-CM | POA: Diagnosis present

## 2020-04-17 DIAGNOSIS — Z87891 Personal history of nicotine dependence: Secondary | ICD-10-CM | POA: Diagnosis not present

## 2020-04-17 DIAGNOSIS — T827XXD Infection and inflammatory reaction due to other cardiac and vascular devices, implants and grafts, subsequent encounter: Secondary | ICD-10-CM | POA: Diagnosis not present

## 2020-04-17 DIAGNOSIS — G9341 Metabolic encephalopathy: Secondary | ICD-10-CM | POA: Diagnosis present

## 2020-04-17 LAB — ECHOCARDIOGRAM LIMITED
Area-P 1/2: 5.27 cm2
Height: 70 in
S' Lateral: 2.6 cm
Weight: 3813.08 oz

## 2020-04-17 LAB — BLOOD CULTURE ID PANEL (REFLEXED) - BCID2

## 2020-04-17 LAB — BRAIN NATRIURETIC PEPTIDE: B Natriuretic Peptide: 295 pg/mL — ABNORMAL HIGH (ref 0.0–100.0)

## 2020-04-17 LAB — RESP PANEL BY RT-PCR (FLU A&B, COVID) ARPGX2
Influenza A by PCR: NEGATIVE
Influenza B by PCR: NEGATIVE
SARS Coronavirus 2 by RT PCR: POSITIVE — AB

## 2020-04-17 LAB — HEMOGLOBIN A1C
Hgb A1c MFr Bld: 6.6 % — ABNORMAL HIGH (ref 4.8–5.6)
Mean Plasma Glucose: 142.72 mg/dL

## 2020-04-17 LAB — GLUCOSE, CAPILLARY
Glucose-Capillary: 138 mg/dL — ABNORMAL HIGH (ref 70–99)
Glucose-Capillary: 130 mg/dL — ABNORMAL HIGH (ref 70–99)
Glucose-Capillary: 120 mg/dL — ABNORMAL HIGH (ref 70–99)
Glucose-Capillary: 139 mg/dL — ABNORMAL HIGH (ref 70–99)

## 2020-04-17 LAB — D-DIMER, QUANTITATIVE: D-Dimer, Quant: 2.7 ug/mL-FEU — ABNORMAL HIGH (ref 0.00–0.50)

## 2020-04-17 LAB — PATHOLOGIST SMEAR REVIEW

## 2020-04-17 LAB — PROCALCITONIN: Procalcitonin: 0.31 ng/mL

## 2020-04-17 MED ORDER — IOHEXOL 300 MG/ML  SOLN
100.0000 mL | Freq: Once | INTRAMUSCULAR | Status: AC | PRN
  Administered 2020-04-17: 100 mL via INTRAVENOUS

## 2020-04-17 MED ORDER — ONDANSETRON HCL 4 MG PO TABS: 4.0000 mg | ORAL_TABLET | Freq: Four times a day (QID) | ORAL | Status: DC | PRN

## 2020-04-17 MED ORDER — VANCOMYCIN HCL 1500 MG/300ML IV SOLN
1500.0000 mg | INTRAVENOUS | Status: DC
  Administered 2020-04-18 – 2020-04-23 (×6): 1500 mg via INTRAVENOUS
  Filled 2020-04-17 (×7): qty 300

## 2020-04-17 MED ORDER — POLYETHYLENE GLYCOL 3350 17 G PO PACK
17.0000 g | PACK | Freq: Every day | ORAL | Status: DC | PRN
  Administered 2020-04-20 – 2020-04-21 (×2): 17 g via ORAL
  Filled 2020-04-17 (×2): qty 1

## 2020-04-17 MED ORDER — ONDANSETRON HCL 4 MG/2ML IJ SOLN: 4.0000 mg | Freq: Four times a day (QID) | INTRAMUSCULAR | Status: DC | PRN

## 2020-04-17 MED ORDER — SODIUM CHLORIDE 0.9 % IV SOLN
200.0000 mg | Freq: Once | INTRAVENOUS | Status: AC
  Administered 2020-04-17: 200 mg via INTRAVENOUS
  Filled 2020-04-17: qty 40

## 2020-04-17 MED ORDER — VANCOMYCIN HCL 2000 MG/400ML IV SOLN
2000.0000 mg | Freq: Once | INTRAVENOUS | Status: AC
  Administered 2020-04-17: 2000 mg via INTRAVENOUS
  Filled 2020-04-17: qty 400

## 2020-04-17 MED ORDER — GUAIFENESIN-DM 100-10 MG/5ML PO SYRP: 10.0000 mL | ORAL_SOLUTION | ORAL | Status: DC | PRN

## 2020-04-17 MED ORDER — TAMSULOSIN HCL 0.4 MG PO CAPS
0.4000 mg | ORAL_CAPSULE | Freq: Every day | ORAL | Status: DC
  Administered 2020-04-17 – 2020-04-24 (×8): 0.4 mg via ORAL
  Filled 2020-04-17 (×8): qty 1

## 2020-04-17 MED ORDER — ACETAMINOPHEN 325 MG PO TABS: 650.0000 mg | ORAL_TABLET | Freq: Four times a day (QID) | ORAL | Status: DC | PRN

## 2020-04-17 MED ORDER — ALBUTEROL SULFATE HFA 108 (90 BASE) MCG/ACT IN AERS
2.0000 | INHALATION_SPRAY | RESPIRATORY_TRACT | Status: DC | PRN
  Filled 2020-04-17: qty 6.7

## 2020-04-17 MED ORDER — ASCORBIC ACID 500 MG PO TABS
500.0000 mg | ORAL_TABLET | Freq: Every day | ORAL | Status: DC
  Administered 2020-04-17 – 2020-04-24 (×8): 500 mg via ORAL
  Filled 2020-04-17 (×8): qty 1

## 2020-04-17 MED ORDER — ZINC SULFATE 220 (50 ZN) MG PO CAPS
220.0000 mg | ORAL_CAPSULE | Freq: Every day | ORAL | Status: DC
  Administered 2020-04-17 – 2020-04-24 (×8): 220 mg via ORAL
  Filled 2020-04-17 (×8): qty 1

## 2020-04-17 MED ORDER — ENOXAPARIN SODIUM 40 MG/0.4ML ~~LOC~~ SOLN
40.0000 mg | Freq: Every day | SUBCUTANEOUS | Status: DC
  Administered 2020-04-17 – 2020-04-21 (×6): 40 mg via SUBCUTANEOUS
  Filled 2020-04-17 (×6): qty 0.4

## 2020-04-17 MED ORDER — INSULIN ASPART 100 UNIT/ML ~~LOC~~ SOLN
0.0000 [IU] | Freq: Three times a day (TID) | SUBCUTANEOUS | Status: DC
  Administered 2020-04-17 – 2020-04-21 (×10): 1 [IU] via SUBCUTANEOUS
  Administered 2020-04-21: 2 [IU] via SUBCUTANEOUS
  Administered 2020-04-22: 1 [IU] via SUBCUTANEOUS
  Administered 2020-04-22: 2 [IU] via SUBCUTANEOUS
  Administered 2020-04-22 – 2020-04-24 (×3): 1 [IU] via SUBCUTANEOUS

## 2020-04-17 MED ORDER — SODIUM CHLORIDE 0.9 % IV SOLN
100.0000 mg | Freq: Every day | INTRAVENOUS | Status: DC
  Administered 2020-04-18: 100 mg via INTRAVENOUS
  Filled 2020-04-17: qty 20

## 2020-04-17 NOTE — ED Notes (Addendum)
RN updated patient's [phone]) on his status ,plan of care/admission .

## 2020-04-17 NOTE — H&P (Addendum)
History and Physical    Judeth PorchMoses M Stolar UEA:[id number] DOB: 03/03/1933 DOA: 04/16/2020  PCP: Patient, No Pcp Per  Patient coming from: Home via EMS   Chief Complaint:  Chief Complaint  Patient presents with  . Abdominal Pain /Fatigue/Fever     HPI:    87 year old male with past medical history of hyperlipidemia, dementia, hypertension, bioprosthetic hyperplasia, pacemaker placement who presents to [facility] emergency department via EMS after they were contacted by family due to progressive confusion.  Patient is an extremely poor historian due to advanced dementia.  Unfortunately, there are no outpatient records available and no one is answering our only contact number on file and therefore the majority of the history is been obtained from EMS and emergency department staff.  Apparently, patient has been developing progressively worsening weakness and confusion prompting contacting her EMS.  Per my discussion with the patient he is quite disoriented but does complain of generalized weakness and lower abdominal pain.  Patient denies cough, significant shortness of breath.  Evaluation in the department patient is found to be febrile with temperature of 102.7 F with substantial leukocytosis of 22.9 and notable lactic acidosis with lactate of 2.70 concerning for developing sepsis.  Urinalysis is found to be suggestive of urinary tract infection and therefore patient has been started on intravenous ceftriaxone.  Patient was initiated on intravenous fluid bolus.  Patient was incidentally found to be positive for COVID initial treatment plan was initiated.  Hospitalist group was then called to assess patient for admission to the hospital.  Review of Systems:   Review of Systems  Unable to perform ROS: Mental status change    Past Medical History:  Diagnosis Date  . Hypertension     History reviewed. No pertinent surgical history.   reports that he has quit smoking. He has  never used smokeless tobacco. He reports that he does not drink alcohol and does not use drugs.  No Known Allergies  No family history on file.   Prior to Admission medications   Medication Sig Start Date End Date Taking? Authorizing Provider  aspirin EC 81 MG tablet Take 81 mg by mouth daily.    [provider]  lisinopril (PRINIVIL,ZESTRIL) 5 MG tablet Take 5 mg by mouth daily.    [provider]  PRESCRIPTION MEDICATION Take 1 tablet by mouth daily.    [provider]  PRESCRIPTION MEDICATION Take 1 tablet by mouth daily.    [provider]  PRESCRIPTION MEDICATION Take 1 tablet by mouth daily.    [provider]  PRESCRIPTION MEDICATION Take 1 tablet by mouth daily.    [provider]  tamsulosin (FLOMAX) 0.4 MG CAPS Take 0.4 mg by mouth daily after breakfast.    [provider]    Physical Exam: Vitals:   04/16/20 2237 04/16/20 2345 04/17/20 0000 04/17/20 0015  BP: 136/84 128/76 132/68 (!) 129/98  Pulse: 86 68 62 (!) 59  Resp: 16 18 17 18   Temp: 98.5 F (36.9 C) 97.9 F (36.6 C)    TempSrc: Oral Oral    SpO2: 100% 100% 100% 100%  Weight:      Height:        Constitutional: Patient is lethargic but arousable and oriented x1.  Patient is not in any acute distress.  Skin: no rashes, no lesions, notable poor skin turgor.   Eyes: Pupils are equally reactive to light.  No evidence of scleral icterus or conjunctival pallor.  ENMT: Dry mucous membranes  noted.   Posterior pharynx clear of any exudate or lesions.   Neck: normal, supple, no masses, no thyromegaly.  No evidence of jugular venous distension.   Respiratory: Notable mild bibasilar rales.  No significant amount of wheezing.  Normal respiratory effort. No accessory muscle use.  Cardiovascular: Regular rate and rhythm, no murmurs / rubs / gallops.  +1 pitting edema of the distal bilateral lower extremities.  2+ pedal pulses. No carotid bruits.  Chest:    Nontender without crepitus or deformity.   Back:   Nontender without crepitus or deformity. Abdomen: Notable lower abdominal tenderness.  Abdomen is soft however.    No evidence of intra-abdominal masses.  Positive bowel sounds noted in all quadrants.   Musculoskeletal: Notable tenderness of the distal bilateral lower extremities without associated deformity.   Good ROM, no contractures. Normal muscle tone.  Neurologic: Patient is lethargic with periods of agitation.  Patient is only oriented x1 but does follow commands.  Sensation intact.  Patient moving all 4 extremities spontaneously.  Patient is following all commands.  Patient is responsive to verbal stimuli.   Psychiatric: Unable to fully assess due to significant confusion.  Patient currently does not seem to possess insight as to his current situation.      Labs on Admission: I have personally reviewed following labs and imaging studies -   CBC: Recent Labs  Lab 04/16/20 2129  WBC 22.9*  NEUTROABS 18.3*  HGB 13.6  HCT 42.7  MCV 90.7  PLT 266   Basic Metabolic Panel: Recent Labs  Lab 04/16/20 2129  NA 132*  K 3.6  CL 100  CO2 23  GLUCOSE 169*  BUN 26*  CREATININE 1.40*  CALCIUM 8.8*   GFR: Estimated Creatinine Clearance: 48.8 mL/min (A) (by C-G formula based on SCr of 1.4 mg/dL (H)). Liver Function Tests: Recent Labs  Lab 04/16/20 2129  AST 25  ALT 16  ALKPHOS 73  BILITOT 1.7*  PROT 6.6  ALBUMIN 3.1*   No results for input(s): LIPASE, AMYLASE in the last 168 hours. No results for input(s): AMMONIA in the last 168 hours. Coagulation Profile: Recent Labs  Lab 04/16/20 2129  INR 1.5*   Cardiac Enzymes: No results for input(s): CKTOTAL, CKMB, CKMBINDEX, TROPONINI in the last 168 hours. BNP (last 3 results) No results for input(s): PROBNP in the last 8760 hours. HbA1C: No results for input(s): HGBA1C in the last 72 hours. CBG: No results for input(s): GLUCAP in the last 168 hours. Lipid Profile: No  results for input(s): CHOL, HDL, LDLCALC, TRIG, CHOLHDL, LDLDIRECT in the last 72 hours. Thyroid Function Tests: No results for input(s): TSH, T4TOTAL, FREET4, T3FREE, THYROIDAB in the last 72 hours. Anemia Panel: No results for input(s): VITAMINB12, FOLATE, FERRITIN, TIBC, IRON, RETICCTPCT in the last 72 hours. Urine analysis:    Component Value Date/Time   COLORURINE AMBER (A) 04/16/2020 2230   APPEARANCEUR CLOUDY (A) 04/16/2020 2230   LABSPEC 1.017 04/16/2020 2230   PHURINE 8.0 04/16/2020 2230   GLUCOSEU NEGATIVE 04/16/2020 2230   HGBUR SMALL (A) 04/16/2020 2230   BILIRUBINUR NEGATIVE 04/16/2020 2230   KETONESUR NEGATIVE 04/16/2020 2230   PROTEINUR 30 (A) 04/16/2020 2230   UROBILINOGEN 4.0 (H) 12/15/2008 0600   NITRITE NEGATIVE 04/16/2020 2230   LEUKOCYTESUR LARGE (A) 04/16/2020 2230    Radiological Exams on Admission - Personally Reviewed: DG Chest Port 1 View  Result Date: 04/16/2020 CLINICAL DATA:  Upper abdominal pain, fatigue, sepsis EXAM: PORTABLE CHEST 1 VIEW COMPARISON:  03/11/2020 FINDINGS:  Single frontal view of the chest demonstrates stable single lead pacer. Cardiac silhouette is enlarged but stable. Continued central vascular congestion without airspace disease, effusion, or pneumothorax. IMPRESSION: 1. Chronic central vascular congestion.  No acute airspace disease. Electronically Signed   By: Sharlet Salina M.D.   On: 04/16/2020 21:35    EKG: Personally reviewed.  Rhythm is paced rhythm with heart rate of 84 bpm.  Unable to adequately assess ST segments due to wide-complex paced rhythm.  Assessment/Plan Principal Problem:   Sepsis (HCC)   Patient is exhibiting multiple SIRS criteria including fever, transient tachypnea and leukocytosis in the setting of lactic acidosis and multiple sources of infection all concerning for early sepsis.  Grossly cloudy urine with lower abdominal tenderness on examination exam concern for urinary tract infection as the source of  patient's early sepsis.  Considering patient's lack of hypoxia or shortness of breath I believe that the COVID-19 diagnosis is more incidental  Hydrating patient with intravenous isotonic fluids  Blood and urine cultures are being obtained  Treating patient with intravenous ceftriaxone which will be escalated based on results of urine culture.  Active Problems:   Lactic acidosis   Lactic acidosis likely secondary to sepsis with concurrent volume depletion  Hydrate patient intravenous isotonic fluids while concurrently treating underlying infection  Monitoring serial lactic acid levels to ensure downtrending and resolution.    COVID-19 virus infection   Patient found to be Covid PCR positive  Chest x-ray additionally feeling extremely patchy mild infiltrates consistent with Covid  Patient exhibiting no evidence of significant respiratory symptoms or hypoxia causing me to believe that patient's Covid infection is more incidental and not causing clinical disease  Therefore initiating 3-day regimen of intravenous remdesivir to reduce risk of progression to more severe illness.  Airborne and contact precautions  As needed antitussives for cough  As needed bronchodilators for shortness of breath and wheezing  Zinc and vitamin C supplementation    Acute cystitis without hematuria   Patient presenting with complaints of abdominal pain and exhibiting lower abdominal tenderness on examination consistent with cystitis  Urinalysis revealing cloudy urine, significant white blood cells per high-powered field and large leukocytes all consistent with urinary tract infection  Considering concurrent diagnosis early sepsis will obtain CT abdomen pelvis to ensure there is no evidence of pyelonephritis or perinephric abscess.  Patient's been initiated on intravenous ceftriaxone by the emergency department which will be continued at this time  Will de-escalate antibiotic therapy based on  results of cultures  Please see remainder of assessment and plan above.    Benign prostatic hyperplasia   Continuing home regimen of Flomax  Postvoid residual bladder scan performed in the emergency department reveals no evidence of urinary retention    Dementia without behavioral disturbance (HCC)   Attempting to minimize painful stimulus but is possible  Frequently  redirecting patient  Fall precautions    Presence of cardiac pacemaker   Indication for pacemaker unclear in the absence of medical records or family to contact preparation  Monitoring patient on telemetry  Diabetes mellitus type 2 without complication    Patient has documented history of prediabetes in the past and is currently hyperglycemic on presentation  Hemoglobin A1c 6.6 suggestive of mild diabetes mellitus.  We will place patient on Accu-Cheks before every meal and nightly for now with low intensity sliding scale insulin     Code Status:  Full code Family Communication: I have attempted to contact family via the sole phone number listed  on the face sheet and have been unsuccessful.  Status is: Observation  The patient remains OBS appropriate and will d/c before 2 midnights.  Dispo: The patient is from: Home              Anticipated d/c is to: Home              Patient currently is not medically stable to d/c.   Difficult to place patient No        Marinda Elk MD [facility] Pager [phone]  If 7PM-7AM, please contact night-coverage [email or web address] Use universal [place] password for that web site. If you do not have the password, please call the hospital operator.  04/17/2020, 1:00 AM

## 2020-04-17 NOTE — ED Notes (Signed)
Patient transported to CT scan . 

## 2020-04-17 NOTE — Progress Notes (Addendum)
PROGRESS NOTE    Adam Conner  JAS:[id number] DOB: Jul 14, 1933 DOA: 04/16/2020 PCP: Patient, No Pcp Per   Brief Narrative:  87 year old male with past medical history of hyperlipidemia, dementia, hypertension, bioprosthetic hyperplasia, pacemaker placement who presents to [facility] emergency department via EMS after they were contacted by family due to progressive confusion. Patient is an extremely poor historian due to advanced dementia.  Unfortunately, there are no outpatient records available and no one is answering our only contact number on file and therefore the majority of the history is been obtained from EMS and emergency department staff.  In the ED patient found to be febrile with leukocytosis and tachycardia concerning for sepsis.  Urinalysis concerning for UTI, antibiotics initiated at intake and patient admitted for further evaluation treatment as below.  Patient incidentally found to be Covid positive, likely not related to his current sepsis findings.  Assessment & Plan:   Principal Problem:   Sepsis (HCC) Active Problems:   Lactic acidosis   Essential hypertension   Benign prostatic hyperplasia   Dementia without behavioral disturbance (HCC)   Presence of cardiac pacemaker   COVID-19 virus infection   Hyperglycemia   Acute cystitis without hematuria  Sepsis secondary to UTI, POA  Rule out concurrent MRSA bacteremia -Patient febrile, leukocytosis, with tachypnea meeting sepsis criteria -Concurrently elevated lactic acidosis, received IV fluids per sepsis protocol -Urinalysis indicative of UTI concurrent with patient's altered mental status, cultures pending -Discntinue IV ceftriaxone, start Vancomycin given prelim BCID results - de-escalate based on sensitivities and findings on urine and blood cultures -Continue to advance diet, patient more awake alert oriented today than previous, holding further IV fluids, patient appears to be more euvolemic at this  point, SIRS criteria resolving  Incidental COVID-19 positive, unclear if acute infection -Chest x-ray shows scant bilateral infiltrate, largely unchanged from previous imaging last week -Continue 3-day treatment of IV Remdesivir, hold off on steroids/immunomodulators in the setting of above bacterial infection -Continue precautions for 10 days per protocol -Continues without hypoxia shortness of breath cough or wheeze.  Continue supportive care but currently no indication for incentive spirometry, flutter, proning or respiratory support  Benign prostatic hyperplasia - Continuing home regimen of Flomax - Postvoid residual bladder scan performed in the emergency department reveals no evidence of urinary retention  Dementia without behavioral disturbance (HCC) -Per previous documentation- -attempting to follow-up with family for baseline -Continue delirium precautions, high risk for sundowning given age and comorbid's above  Presence of cardiac pacemaker -Unclear indication, poor records, no family available, patient remains altered as above   Diabetes mellitus type 2 without complication, questionably newly diagnosed -Distant records mention prediabetes, A1c 6.6 at intake  -Continue sliding scale insulin, hypoglycemic protocol  Code Status:  Full code Family Communication:  None available Status is: Inpatient  Dispo: The patient is from: Home              Anticipated d/c is to: To be determined              Anticipated d/c date is: To be determined              Patient currently not medically stable for discharge  Consultants:   None  Procedures:   None  Antimicrobials:  Ceftriaxone  Subjective: No acute issues or events overnight, patient remains altered, review of systems markedly limited.  Objective: Vitals:   04/17/20 0210 04/17/20 0422 04/17/20 0516 04/17/20 0744  BP: (!) 154/82 (!) 150/102 (!) 159/75 (!) 144/62  Pulse: 88  70 60  Resp: 17 (!) 21 19 (!) 21   Temp: 98.2 F (36.8 C) 98.7 F (37.1 C)  98.9 F (37.2 C)  TempSrc: Oral Oral  Oral  SpO2: 97%  91% 92%  Weight:  108.1 kg    Height:       No intake or output data in the 24 hours ending 04/17/20 0753 Filed Weights   04/16/20 2056 04/17/20 0422  Weight: 110 kg 108.1 kg    Examination:  General:  Pleasantly resting in bed, No acute distress. HEENT:  Normocephalic atraumatic.  Sclerae nonicteric, noninjected.  Extraocular movements intact bilaterally. Neck:  Without mass or deformity.  Trachea is midline. Lungs:  Clear to auscultate bilaterally without rhonchi, wheeze, or rales. Heart:  Regular rate and rhythm.  Without murmurs, rubs, or gallops. Abdomen:  Soft, nontender, nondistended.  Without guarding or rebound. Extremities: Without cyanosis, clubbing, edema, or obvious deformity. Vascular:  Dorsalis pedis and posterior tibial pulses palpable bilaterally. Skin:  Warm and dry, no erythema, no ulcerations.   Data Reviewed: I have personally reviewed following labs and imaging studies  CBC: Recent Labs  Lab 04/16/20 2129  WBC 22.9*  NEUTROABS 18.3*  HGB 13.6  HCT 42.7  MCV 90.7  PLT 266   Basic Metabolic Panel: Recent Labs  Lab 04/16/20 2129  NA 132*  K 3.6  CL 100  CO2 23  GLUCOSE 169*  BUN 26*  CREATININE 1.40*  CALCIUM 8.8*   GFR: Estimated Creatinine Clearance: 48.3 mL/min (A) (by C-G formula based on SCr of 1.4 mg/dL (H)). Liver Function Tests: Recent Labs  Lab 04/16/20 2129  AST 25  ALT 16  ALKPHOS 73  BILITOT 1.7*  PROT 6.6  ALBUMIN 3.1*   No results for input(s): LIPASE, AMYLASE in the last 168 hours. No results for input(s): AMMONIA in the last 168 hours. Coagulation Profile: Recent Labs  Lab 04/16/20 2129  INR 1.5*   Cardiac Enzymes: No results for input(s): CKTOTAL, CKMB, CKMBINDEX, TROPONINI in the last 168 hours. BNP (last 3 results) No results for input(s): PROBNP in the last 8760 hours. HbA1C: Recent Labs     04/16/20 2129  HGBA1C 6.6*   CBG: No results for input(s): GLUCAP in the last 168 hours. Lipid Profile: No results for input(s): CHOL, HDL, LDLCALC, TRIG, CHOLHDL, LDLDIRECT in the last 72 hours. Thyroid Function Tests: No results for input(s): TSH, T4TOTAL, FREET4, T3FREE, THYROIDAB in the last 72 hours. Anemia Panel: No results for input(s): VITAMINB12, FOLATE, FERRITIN, TIBC, IRON, RETICCTPCT in the last 72 hours. Sepsis Labs: Recent Labs  Lab 04/16/20 2129 04/16/20 2317  PROCALCITON 0.31  --   LATICACIDVEN 2.7* 1.8    Recent Results (from the past 240 hour(s))  Resp Panel by RT-PCR (Flu A&B, Covid) Nasopharyngeal Swab     Status: Abnormal   Collection Time: 04/16/20 10:35 PM   Specimen: Nasopharyngeal Swab; Nasopharyngeal(NP) swabs in vial transport medium  Result Value Ref Range Status   SARS Coronavirus 2 by RT PCR POSITIVE (A) NEGATIVE Final    Comment: RESULT CALLED TO, READ BACK BY AND VERIFIED WITH: B SANGALANG RN 04/17/20  0022 JDW (NOTE) SARS-CoV-2 target nucleic acids are DETECTED.  The SARS-CoV-2 RNA is generally detectable in upper respiratory specimens during the acute phase of infection. Positive results are indicative of the presence of the identified virus, but Adam Conner not rule out bacterial infection or co-infection with other pathogens not detected by the test. Clinical correlation with patient history and  other diagnostic information is necessary to determine patient infection status. The expected result is Negative.  Fact Sheet for Patients: BloggerCourse.com  Fact Sheet for Healthcare Providers: SeriousBroker.it  This test is not yet approved or cleared by the [place] FDA and  has been authorized for detection and/or diagnosis of SARS-CoV-2 by FDA under an Emergency Use Authorization (EUA).  This EUA will remain in effect (meaning this test can be  used) for the duration of  the COVID-19  declaration under Section 564(b)(1) of the Act, 21 U.S.C. section 360bbb-3(b)(1), unless the authorization is terminated or revoked sooner.     Influenza A by PCR NEGATIVE NEGATIVE Final   Influenza B by PCR NEGATIVE NEGATIVE Final    Comment: (NOTE) The Xpert Xpress SARS-CoV-2/FLU/RSV plus assay is intended as an aid in the diagnosis of influenza from Nasopharyngeal swab specimens and should not be used as a sole basis for treatment. Nasal washings and aspirates are unacceptable for Xpert Xpress SARS-CoV-2/FLU/RSV testing.  Fact Sheet for Patients: BloggerCourse.com  Fact Sheet for Healthcare Providers: SeriousBroker.it  This test is not yet approved or cleared by the [place] FDA and has been authorized for detection and/or diagnosis of SARS-CoV-2 by FDA under an Emergency Use Authorization (EUA). This EUA will remain in effect (meaning this test can be used) for the duration of the COVID-19 declaration under Section 564(b)(1) of the Act, 21 U.S.C. section 360bbb-3(b)(1), unless the authorization is terminated or revoked.  Performed at [facility], [address], [place], [place] [address]          Radiology Studies: CT ABDOMEN PELVIS W CONTRAST  Result Date: 04/17/2020 CLINICAL DATA:  Fatigue, fever, weakness and confusion. EXAM: CT ABDOMEN AND PELVIS WITH CONTRAST TECHNIQUE: Multidetector CT imaging of the abdomen and pelvis was performed using the standard protocol following bolus administration of intravenous contrast. CONTRAST:  OMNIPAQUE IOHEXOL 300 MG/ML  SOLN COMPARISON:  None. FINDINGS: Lower chest: Mild areas of scarring and/or atelectasis are seen within the bilateral lung bases. Hepatobiliary: No focal liver abnormality is seen. Numerous subcentimeter gallstones are seen within the lumen of an otherwise normal-appearing gallbladder. Pancreas: Unremarkable. No pancreatic ductal dilatation or  surrounding inflammatory changes. Spleen: Normal in size without focal abnormality. Adrenals/Urinary Tract: Adrenal glands are unremarkable. Kidneys are normal, without renal calculi or hydronephrosis. A 4.4 cm x 3.7 cm simple cyst is seen within the lateral aspect of the mid left kidney. An additional 2.2 cm x 1.5 cm exophytic simple cyst is seen along the anterior aspect of the mid left kidney. A 0.7 cm right renal cyst is also seen. Bladder is unremarkable. Stomach/Bowel: There is a small hiatal hernia. Appendix appears normal. No evidence of bowel wall thickening, distention, or inflammatory changes. Noninflamed diverticula are seen throughout the large bowel. Vascular/Lymphatic: There is marked severity aortic calcification with 4.1 cm x 3.8 cm aneurysmal dilatation of the infrarenal abdominal aorta. There is no evidence of aortic dissection. There is mild para-aortic and aortocaval lymphadenopathy. The largest lymph node measures approximately 2.2 cm x 1.1 cm. Reproductive: Multiple prostate radiation implantation seeds are seen. Other: Small, bilateral fat containing inguinal hernias are seen. No abdominopelvic ascites. Musculoskeletal: No acute or significant osseous findings. IMPRESSION: 1. Cholelithiasis without evidence of cholecystitis. 2. 4.1 cm x 3.8 cm infrarenal abdominal aortic aneurysm. 3. Colonic diverticulosis. 4. Small hiatal hernia. 5. Small, bilateral fat containing inguinal hernias. 6. Aortic atherosclerosis. Aortic Atherosclerosis (ICD10-I70.0). Electronically Signed   By: Demetrius Revel.D.  On: 04/17/2020 01:58   DG Chest Port 1 View  Result Date: 04/16/2020 CLINICAL DATA:  Upper abdominal pain, fatigue, sepsis EXAM: PORTABLE CHEST 1 VIEW COMPARISON:  03/11/2020 FINDINGS: Single frontal view of the chest demonstrates stable single lead pacer. Cardiac silhouette is enlarged but stable. Continued central vascular congestion without airspace disease, effusion, or pneumothorax.  IMPRESSION: 1. Chronic central vascular congestion.  No acute airspace disease. Electronically Signed   By: Sharlet SalinaMichael  Brown M.D.   On: 04/16/2020 21:35        Scheduled Meds: . vitamin C  500 mg Oral Daily  . enoxaparin (LOVENOX) injection  40 mg Subcutaneous QHS  . insulin aspart  0-9 Units Subcutaneous TID AC & HS  . tamsulosin  0.4 mg Oral QPC breakfast  . zinc sulfate  220 mg Oral Daily   Continuous Infusions: . cefTRIAXone (ROCEPHIN)  IV Stopped (04/17/20 0028)  . [START ON 04/18/2020] remdesivir 100 mg in NS 100 mL       LOS: 0 days    Time spent: 40    Adam FallenWilliam C Bryleigh Ottaway, Adam Conner [facility]  If 7PM-7AM, please contact night-coverage [email or web address]  04/17/2020, 7:53 AM

## 2020-04-17 NOTE — Progress Notes (Signed)
Patient received to the unit. Patient is alert and oriented self. Vital signs are stable. Iv in place. Skin assessment done with another nurse. Given instructions about call bell and phone. Bed in low position and call bell in reach.

## 2020-04-17 NOTE — Progress Notes (Signed)
Pharmacy Antibiotic Note  Adam Conner is a 87 y.o. male admitted on 04/16/2020 with bacteremia.  Pharmacy has been consulted for vancomycin dosing.  Blood culture rapid ID with MRSA.  Plan: Vancomycin 2g IV x 1, then 1500 mg IV q 24 hrs. F/u renal function and clinical course.  Height: 5\' 10"  (177.8 cm) Weight: 108.1 kg (238 lb 5.1 oz) IBW/kg (Calculated) : 73  Temp (24hrs), Avg:99.1 F (37.3 C), Min:97.9 F (36.6 C), Max:102.7 F (39.3 C)  Recent Labs  Lab 04/16/20 2129 04/16/20 2317  WBC 22.9*  --   CREATININE 1.40*  --   LATICACIDVEN 2.7* 1.8    Estimated Creatinine Clearance: 48.3 mL/min (A) (by C-G formula based on SCr of 1.4 mg/dL (H)).    No Known Allergies  Antimicrobials this admission: Ceftriaxone 2/24 > 2/25 Vancomycin 2/25 >   Dose adjustments this admission:  Microbiology results: 2/24 UCx - pending 2/24 BCx - 1/2 MRSA  Thank you for allowing pharmacy to be a part of this patient's care.  3/24, Reece Leader, BCCP Clinical Pharmacist  04/17/2020 5:22 PM   [facility] pharmacy phone numbers are listed on [email or web address]

## 2020-04-17 NOTE — Progress Notes (Signed)
  Echocardiogram 2D Echocardiogram has been performed.  Delcie Roch 04/17/2020, 10:24 AM

## 2020-04-18 ENCOUNTER — Inpatient Hospital Stay (HOSPITAL_COMMUNITY): Payer: Medicare PPO

## 2020-04-18 ENCOUNTER — Encounter (HOSPITAL_COMMUNITY): Payer: Self-pay | Admitting: Internal Medicine

## 2020-04-18 DIAGNOSIS — E11621 Type 2 diabetes mellitus with foot ulcer: Secondary | ICD-10-CM

## 2020-04-18 DIAGNOSIS — N3000 Acute cystitis without hematuria: Secondary | ICD-10-CM | POA: Diagnosis not present

## 2020-04-18 DIAGNOSIS — B964 Proteus (mirabilis) (morganii) as the cause of diseases classified elsewhere: Secondary | ICD-10-CM

## 2020-04-18 DIAGNOSIS — A419 Sepsis, unspecified organism: Secondary | ICD-10-CM | POA: Diagnosis not present

## 2020-04-18 DIAGNOSIS — I714 Abdominal aortic aneurysm, without rupture: Secondary | ICD-10-CM | POA: Diagnosis not present

## 2020-04-18 DIAGNOSIS — B9562 Methicillin resistant Staphylococcus aureus infection as the cause of diseases classified elsewhere: Secondary | ICD-10-CM | POA: Diagnosis not present

## 2020-04-18 DIAGNOSIS — Z950 Presence of cardiac pacemaker: Secondary | ICD-10-CM

## 2020-04-18 DIAGNOSIS — R7881 Bacteremia: Secondary | ICD-10-CM

## 2020-04-18 DIAGNOSIS — U071 COVID-19: Secondary | ICD-10-CM

## 2020-04-18 DIAGNOSIS — F0390 Unspecified dementia without behavioral disturbance: Secondary | ICD-10-CM | POA: Diagnosis not present

## 2020-04-18 DIAGNOSIS — L97529 Non-pressure chronic ulcer of other part of left foot with unspecified severity: Secondary | ICD-10-CM

## 2020-04-18 LAB — CBC WITH DIFFERENTIAL/PLATELET
Abs Immature Granulocytes: 0.12 10*3/uL — ABNORMAL HIGH (ref 0.00–0.07)
Basophils Absolute: 0 10*3/uL (ref 0.0–0.1)
Basophils Relative: 0 %
Eosinophils Absolute: 0 10*3/uL (ref 0.0–0.5)
Eosinophils Relative: 0 %
HCT: 37.2 % — ABNORMAL LOW (ref 39.0–52.0)
Hemoglobin: 12.6 g/dL — ABNORMAL LOW (ref 13.0–17.0)
Immature Granulocytes: 1 %
Lymphocytes Relative: 7 %
Lymphs Abs: 1.4 10*3/uL (ref 0.7–4.0)
MCH: 29.7 pg (ref 26.0–34.0)
MCHC: 33.9 g/dL (ref 30.0–36.0)
MCV: 87.7 fL (ref 80.0–100.0)
Monocytes Absolute: 3.2 10*3/uL — ABNORMAL HIGH (ref 0.1–1.0)
Monocytes Relative: 16 %
Neutro Abs: 16 10*3/uL — ABNORMAL HIGH (ref 1.7–7.7)
Neutrophils Relative %: 76 %
Platelets: 220 10*3/uL (ref 150–400)
RBC: 4.24 MIL/uL (ref 4.22–5.81)
RDW: 13.6 % (ref 11.5–15.5)
WBC: 20.8 10*3/uL — ABNORMAL HIGH (ref 4.0–10.5)
nRBC: 0 % (ref 0.0–0.2)

## 2020-04-18 LAB — COMPREHENSIVE METABOLIC PANEL
ALT: 17 U/L (ref 0–44)
AST: 22 U/L (ref 15–41)
Albumin: 2.6 g/dL — ABNORMAL LOW (ref 3.5–5.0)
Alkaline Phosphatase: 73 U/L (ref 38–126)
Anion gap: 11 (ref 5–15)
BUN: 23 mg/dL (ref 8–23)
CO2: 24 mmol/L (ref 22–32)
Calcium: 8.5 mg/dL — ABNORMAL LOW (ref 8.9–10.3)
Chloride: 101 mmol/L (ref 98–111)
Creatinine, Ser: 1.17 mg/dL (ref 0.61–1.24)
GFR, Estimated: >60 mL/min (ref >60)
Glucose, Bld: 114 mg/dL — ABNORMAL HIGH (ref 70–99)
Potassium: 2.9 mmol/L — ABNORMAL LOW (ref 3.5–5.1)
Sodium: 136 mmol/L (ref 135–145)
Total Bilirubin: 1.7 mg/dL — ABNORMAL HIGH (ref 0.3–1.2)
Total Protein: 6.1 g/dL — ABNORMAL LOW (ref 6.5–8.1)

## 2020-04-18 LAB — GLUCOSE, CAPILLARY
Glucose-Capillary: 126 mg/dL — ABNORMAL HIGH (ref 70–99)
Glucose-Capillary: 135 mg/dL — ABNORMAL HIGH (ref 70–99)
Glucose-Capillary: 121 mg/dL — ABNORMAL HIGH (ref 70–99)
Glucose-Capillary: 126 mg/dL — ABNORMAL HIGH (ref 70–99)

## 2020-04-18 LAB — C-REACTIVE PROTEIN: CRP: 21.5 mg/dL — ABNORMAL HIGH (ref <1.0)

## 2020-04-18 LAB — MAGNESIUM: Magnesium: 1.7 mg/dL (ref 1.7–2.4)

## 2020-04-18 LAB — D-DIMER, QUANTITATIVE: D-Dimer, Quant: 0.83 ug/mL-FEU — ABNORMAL HIGH (ref 0.00–0.50)

## 2020-04-18 MED ORDER — POTASSIUM CHLORIDE 10 MEQ/100ML IV SOLN
10.0000 meq | INTRAVENOUS | Status: AC
  Administered 2020-04-18 (×4): 10 meq via INTRAVENOUS
  Filled 2020-04-18 (×4): qty 100

## 2020-04-18 NOTE — Evaluation (Signed)
Physical Therapy Evaluation Patient Details Name: Adam Conner MRN: [id number] DOB: Aug 17, 1933 Today's Date: 04/18/2020   History of Present Illness  87 y.o. male with past medical history of hyperlipidemia, hypertension, BPH, dementia, status post pacemaker placement presenting to [facility] via EMS after they were contacted by family members due to noted progressive confusion from the patient. Pt admitted with dx of sepsis. Incidentally found to be covid+.    Clinical Impression  Pt admitted with above diagnosis. PTA pt lived at home alone, ambulatory with cane. PCA and family assist with ADLs/iADLs nearly 24-hours/day. Daughter reports pt does have a rollator but does not like using it. On eval, pt required min assist bed mobility, min assist transfers, and min guard assist ambulation 20' with RW. Pt currently with functional limitations due to the deficits listed below (see PT Problem List). Pt will benefit from skilled PT to increase their independence and safety with mobility to allow discharge to the venue listed below.       Follow Up Recommendations Home health PT;Supervision/Assistance - 24 hour    Equipment Recommendations  None recommended by PT    Recommendations for Other Services       Precautions / Restrictions Precautions Precautions: Fall      Mobility  Bed Mobility Overal bed mobility: Needs Assistance Bed Mobility: Supine to Sit     Supine to sit: Min assist;HOB elevated     General bed mobility comments: +rail, increased time, cues for sequencing    Transfers Overall transfer level: Needs assistance Equipment used: Rolling walker (2 wheeled) Transfers: Sit to/from Stand Sit to Stand: Min assist         General transfer comment: cues for hand placement, assist to power up and stabilize balance  Ambulation/Gait Ambulation/Gait assistance: Min guard Gait Distance (Feet): 20 Feet Assistive device: Rolling walker (2 wheeled) Gait  Pattern/deviations: Step-through pattern;Decreased stride length Gait velocity: decreased   General Gait Details: steady gait with RW  Stairs            Wheelchair Mobility    Modified Rankin (Stroke Patients Only)       Balance Overall balance assessment: Needs assistance Sitting-balance support: No upper extremity supported;Feet supported Sitting balance-Leahy Scale: Fair Sitting balance - Comments: able to sit EOB supervision, unable to attempt to don socks (pt states "somebody does that for me at home")                                     Pertinent Vitals/Pain Pain Assessment: No/denies pain    Home Living Family/patient expects to be discharged to:: Private residence Living Arrangements: Alone Available Help at Discharge: Family;Personal care attendant Type of Home: House Home Access: Level entry     Home Layout: One level Home Equipment: Cane - single point;Walker - 4 wheels Additional Comments: Spoke with pt's daughter via phone to confirm PLOF and home set up.    Prior Function Level of Independence: Needs assistance   Gait / Transfers Assistance Needed: ambulates with cane  ADL's / Homemaking Assistance Needed: assist as needed from PCA and family  Comments: Pt has a caretaker 10 days on, then 10 days off. Caretaker is there 24-hours during their 10 day shift. On the 10 days off, family assists pt as able but not 24-hours/day. He is alone some during that time.     Hand Dominance  Extremity/Trunk Assessment   Upper Extremity Assessment Upper Extremity Assessment: Defer to OT evaluation    Lower Extremity Assessment Lower Extremity Assessment: Overall WFL for tasks assessed    Cervical / Trunk Assessment Cervical / Trunk Assessment: Normal  Communication   Communication: No difficulties  Cognition Arousal/Alertness: Awake/alert Behavior During Therapy: WFL for tasks assessed/performed Overall Cognitive Status: No  family/caregiver present to determine baseline cognitive functioning Area of Impairment: Safety/judgement;Memory;Problem solving;Awareness;Orientation;Attention;Following commands                 Orientation Level: Disoriented to;Situation;Time Current Attention Level: Selective Memory: Decreased short-term memory;Decreased recall of precautions Following Commands: Follows one step commands consistently Safety/Judgement: Decreased awareness of safety Awareness: Emergent Problem Solving: Slow processing;Difficulty sequencing;Requires verbal cues General Comments: Advanced dementia at baseline. Presented to ED due to increased confusion.      General Comments General comments (skin integrity, edema, etc.): VSS on RA    Exercises     Assessment/Plan    PT Assessment Patient needs continued PT services  PT Problem List Decreased mobility;Decreased safety awareness;Decreased activity tolerance;Decreased balance;Decreased knowledge of use of DME       PT Treatment Interventions DME instruction;Therapeutic activities;Gait training;Therapeutic exercise;Patient/family education;Balance training;Functional mobility training    PT Goals (Current goals can be found in the Care Plan section)  Acute Rehab PT Goals Patient Stated Goal: home PT Goal Formulation: With patient/family Time For Goal Achievement: 05/02/20 Potential to Achieve Goals: Good    Frequency Min 3X/week   Barriers to discharge        Co-evaluation               AM-PAC PT "6 Clicks" Mobility  Outcome Measure Help needed turning from your back to your side while in a flat bed without using bedrails?: A Little Help needed moving from lying on your back to sitting on the side of a flat bed without using bedrails?: A Little Help needed moving to and from a bed to a chair (including a wheelchair)?: A Little Help needed standing up from a chair using your arms (e.g., wheelchair or bedside chair)?: A  Little Help needed to walk in hospital room?: A Little Help needed climbing 3-5 steps with a railing? : A Lot 6 Click Score: 17    End of Session Equipment Utilized During Treatment: Gait belt Activity Tolerance: Patient tolerated treatment well Patient left: in chair;with call bell/phone within reach;with chair alarm set Nurse Communication: Mobility status PT Visit Diagnosis: Unsteadiness on feet (R26.81);Difficulty in walking, not elsewhere classified (R26.2)    Time: 1335-1406 PT Time Calculation (min) (ACUTE ONLY): 31 min   Charges:   PT Evaluation $PT Eval Moderate Complexity: 1 Mod PT Treatments $Gait Training: 8-22 mins        Aida Raider, PT  Office # [phone] Pager [phone]   Ilda Foil 04/18/2020, 3:25 PM

## 2020-04-18 NOTE — Consult Note (Signed)
[facility] for Infectious Disease    Date of Admission:  04/16/2020     Reason for Consult: MRSA bacteremia     Referring Physician: Automatic consultation  Current antibiotics: Day 2 vancomycin (2/25--present) Day 2 remdesivir (2/25--present)  Previous antibiotics: Ceftriaxone x1 dose (2/24)   Principal Problem:   Sepsis ([place]) Active Problems:   Lactic acidosis   Essential hypertension   Benign prostatic hyperplasia   Dementia without behavioral disturbance (HCC)   Presence of cardiac pacemaker   COVID-19 virus infection   Hyperglycemia   Acute cystitis without hematuria   Sepsis secondary to UTI ([place])   ASSESSMENT:    MRSA bacteremia: Unclear source as patient does not endorse any localizing symptoms.  Potentially could be from left great toe ulcer, but does not look too bad.  Transthoracic echo did not note any findings of vegetation. Proteus mirabilis bacteriuria: Unclear significance and presentation likely better explained by MRSA bacteremia.  CT on admission without any concerning findings in the bladder. Incidental COVID-19: Currently on room air and not hypoxic. History of pacemaker: Unclear details as to when this was placed or the indications for it. Infrarenal abdominal aortic aneurysm: Noted to be 4.1 x 3.8 cm on CT scan. Diabetic foot ulcer: A1c 6.6 Dementia  PLAN:    Continue vancomycin dosed per pharmacy Favor not treating his bacteriuria given its unclear significance and alternate infection  Recommend discussing with cardiology regarding getting a TEE and consideration of pacemaker extraction in the setting of MRSA bacteremia Will obtain x-rays of his foot to start.  May need MRI to rule out a deeper infection Repeat blood cultures tomorrow Hold off on placing any lines Covid treatment per primary We will continue to follow  HPI:    Adam Conner is a 87 y.o. male with past medical history of hyperlipidemia, hypertension, BPH, dementia,  status post pacemaker placement presenting to [facility] via EMS after they were contacted by family members due to noted progressive confusion from the patient.  Per admission H&P patient was unable to give a accurate history due to his advanced dementia.  He currently is alert and oriented to himself and to the fact that he is in [place], however, he is unable to provide much else in terms of history other than the endorsement of some general weakness and vague lower abdominal pain.  He does have an ulcer on his left great toe, however, he denies any pain in this area.  In the emergency department patient was found to be febrile with elevated white blood cell count 22.9 and lactic acidosis.  Urinalysis was obtained which revealed pyuria, rare bacteria, and negative nitrites.  Urine culture is growing Proteus mirabilis with susceptibilities pending.  Peripheral blood cultures were also obtained at admission but only 1 set was done.  This is positive in both bottles for staph aureus with MRSA detected on BC ID.  Incidentally, patient was also found to be positive for Covid 19 although he is asymptomatic.  He was initially treated with ceftriaxone for presumed urinary tract infection and remdesivir for asymptomatic COVID-19.  He has since been started on vancomycin due to the positive blood culture results and ceftriaxone has been discontinued.  Imaging from this admission includes a chest x-ray with central vascular congestion but no other acute airspace disease.  CT abdomen and pelvis with contrast was notable for cholelithiasis without cholecystitis.  There was a 4.1 cm x 3.8 cm infrarenal abdominal aortic aneurysm.  His  bladder was noted to be unremarkable.  A transthoracic echo was done yesterday and did not reveal any obvious vegetations.     Past Medical History:  Diagnosis Date  . Hypertension     Social History   Tobacco Use  . Smoking status: Former Research scientist (life sciences)  . Smokeless tobacco:  Never Used  Substance Use Topics  . Alcohol use: No  . Drug use: No    No family history on file.  No Known Allergies  Review of Systems  Unable to perform ROS: Dementia    OBJECTIVE:   Blood pressure (!) 146/72, pulse 64, temperature 97.6 F (36.4 C), temperature source Oral, resp. rate 16, height $RemoveBe'5\' 10"'XQirmhuib$  (1.778 m), weight 108.1 kg, SpO2 99 %. Body mass index is 34.2 kg/m.  Physical Exam Constitutional:      Comments: Chronically ill-appearing gentleman lying in bed, no acute distress  HENT:     Head: Normocephalic and atraumatic.  Eyes:     Extraocular Movements: Extraocular movements intact.     Conjunctiva/sclera: Conjunctivae normal.  Cardiovascular:     Rate and Rhythm: Normal rate and regular rhythm.     Heart sounds: No murmur heard.     Comments: Left-sided pacemaker pocket without any warmth or erythema. Pulmonary:     Effort: Pulmonary effort is normal. No respiratory distress.     Breath sounds: Normal breath sounds.     Comments: On room air Abdominal:     General: Abdomen is flat. There is no distension.     Palpations: Abdomen is soft.     Tenderness: There is no abdominal tenderness.  Musculoskeletal:        General: Signs of injury present. No swelling or tenderness.     Cervical back: Normal range of motion and neck supple.     Right lower leg: No edema.     Left lower leg: No edema.     Comments: Left great toe with ulcer noted at the distal aspect.  No drainage, erythema, tenderness  Neurological:     General: No focal deficit present.     Cranial Nerves: No cranial nerve deficit.     Comments: He is oriented to himself, [place], but not the year  Psychiatric:        Mood and Affect: Mood normal.        Behavior: Behavior normal.      Lab Results: Lab Results  Component Value Date   WBC 20.8 (H) 04/18/2020   HGB 12.6 (L) 04/18/2020   HCT 37.2 (L) 04/18/2020   MCV 87.7 04/18/2020   PLT 220 04/18/2020    Lab Results  Component  Value Date   NA 136 04/18/2020   K 2.9 (L) 04/18/2020   CO2 24 04/18/2020   GLUCOSE 114 (H) 04/18/2020   BUN 23 04/18/2020   CREATININE 1.17 04/18/2020   CALCIUM 8.5 (L) 04/18/2020   GFRNONAA >60 04/18/2020   GFRAA  12/16/2008    >60        The eGFR has been calculated using the MDRD equation. This calculation has not been validated in all clinical situations. eGFR's persistently <60 mL/min signify possible Chronic Kidney Disease.    Lab Results  Component Value Date   ALT 17 04/18/2020   AST 22 04/18/2020   ALKPHOS 73 04/18/2020   BILITOT 1.7 (H) 04/18/2020       Component Value Date/Time   CRP 21.5 (H) 04/18/2020 0059    No results found for: ESRSEDRATE  I have  reviewed the micro and lab results in [place].  Imaging: CT ABDOMEN PELVIS W CONTRAST  Result Date: 04/17/2020 CLINICAL DATA:  Fatigue, fever, weakness and confusion. EXAM: CT ABDOMEN AND PELVIS WITH CONTRAST TECHNIQUE: Multidetector CT imaging of the abdomen and pelvis was performed using the standard protocol following bolus administration of intravenous contrast. CONTRAST:  158m OMNIPAQUE IOHEXOL 300 MG/ML  SOLN COMPARISON:  None. FINDINGS: Lower chest: Mild areas of scarring and/or atelectasis are seen within the bilateral lung bases. Hepatobiliary: No focal liver abnormality is seen. Numerous subcentimeter gallstones are seen within the lumen of an otherwise normal-appearing gallbladder. Pancreas: Unremarkable. No pancreatic ductal dilatation or surrounding inflammatory changes. Spleen: Normal in size without focal abnormality. Adrenals/Urinary Tract: Adrenal glands are unremarkable. Kidneys are normal, without renal calculi or hydronephrosis. A 4.4 cm x 3.7 cm simple cyst is seen within the lateral aspect of the mid left kidney. An additional 2.2 cm x 1.5 cm exophytic simple cyst is seen along the anterior aspect of the mid left kidney. A 0.7 cm right renal cyst is also seen. Bladder is unremarkable.  Stomach/Bowel: There is a small hiatal hernia. Appendix appears normal. No evidence of bowel wall thickening, distention, or inflammatory changes. Noninflamed diverticula are seen throughout the large bowel. Vascular/Lymphatic: There is marked severity aortic calcification with 4.1 cm x 3.8 cm aneurysmal dilatation of the infrarenal abdominal aorta. There is no evidence of aortic dissection. There is mild para-aortic and aortocaval lymphadenopathy. The largest lymph node measures approximately 2.2 cm x 1.1 cm. Reproductive: Multiple prostate radiation implantation seeds are seen. Other: Small, bilateral fat containing inguinal hernias are seen. No abdominopelvic ascites. Musculoskeletal: No acute or significant osseous findings. IMPRESSION: 1. Cholelithiasis without evidence of cholecystitis. 2. 4.1 cm x 3.8 cm infrarenal abdominal aortic aneurysm. 3. Colonic diverticulosis. 4. Small hiatal hernia. 5. Small, bilateral fat containing inguinal hernias. 6. Aortic atherosclerosis. Aortic Atherosclerosis (ICD10-I70.0). Electronically Signed   By: TVirgina NorfolkM.D.   On: 04/17/2020 01:58   DG Chest Port 1 View  Result Date: 04/16/2020 CLINICAL DATA:  Upper abdominal pain, fatigue, sepsis EXAM: PORTABLE CHEST 1 VIEW COMPARISON:  03/11/2020 FINDINGS: Single frontal view of the chest demonstrates stable single lead pacer. Cardiac silhouette is enlarged but stable. Continued central vascular congestion without airspace disease, effusion, or pneumothorax. IMPRESSION: 1. Chronic central vascular congestion.  No acute airspace disease. Electronically Signed   By: MRanda NgoM.D.   On: 04/16/2020 21:35   ECHOCARDIOGRAM LIMITED  Result Date: 04/17/2020    ECHOCARDIOGRAM LIMITED REPORT   Patient Name:   Adam Conner of Exam: 04/17/2020 Medical Rec #:  [id number]    Height:       70.0 in Accession #:    [id number]   Weight:       238.3 lb Date of Birth:  [birth year]   BSA:          2.249 m Patient Age:    89 years      BP:           141/66 mmHg Patient Gender: M             HR:           60 bpm. Exam Location:  Inpatient Procedure: 2D Echo Indications:    acute systolic chf.  History:        Patient has no prior history of Echocardiogram examinations.  Pacemaker, Covid. Sepsis; Risk Factors:Hypertension.  Sonographer:    Johny Chess Referring Phys: [id number] Vernelle Emerald  Sonographer Comments: Image acquisition challenging due to uncooperative patient. IMPRESSIONS  1. Left ventricular ejection fraction, by estimation, is 60 to 65%. The left ventricle has normal function. The left ventricle has no regional wall motion abnormalities. There is mild left ventricular hypertrophy.  2. Right ventricular systolic function is normal. The right ventricular size is normal.  3. Left atrial size was moderately dilated.  4. The mitral valve is normal in structure. Trivial mitral valve regurgitation. No evidence of mitral stenosis.  5. The aortic valve is normal in structure. There is moderate calcification of the aortic valve. There is moderate thickening of the aortic valve. Aortic valve regurgitation is not visualized. Mild to moderate aortic valve sclerosis/calcification is present, without any evidence of aortic stenosis.  6. The inferior vena cava is dilated in size with <50% respiratory variability, suggesting right atrial pressure of 15 mmHg. FINDINGS  Left Ventricle: Left ventricular ejection fraction, by estimation, is 60 to 65%. The left ventricle has normal function. The left ventricle has no regional wall motion abnormalities. The left ventricular internal cavity size was normal in size. There is  mild left ventricular hypertrophy. Right Ventricle: The right ventricular size is normal. No increase in right ventricular wall thickness. Right ventricular systolic function is normal. Left Atrium: Left atrial size was moderately dilated. Right Atrium: Right atrial size was normal in size. Pericardium:  There is no evidence of pericardial effusion. Mitral Valve: The mitral valve is normal in structure. Trivial mitral valve regurgitation. No evidence of mitral valve stenosis. Tricuspid Valve: The tricuspid valve is normal in structure. Tricuspid valve regurgitation is not demonstrated. No evidence of tricuspid stenosis. Aortic Valve: The aortic valve is normal in structure. There is moderate calcification of the aortic valve. There is moderate thickening of the aortic valve. Aortic valve regurgitation is not visualized. Mild to moderate aortic valve sclerosis/calcification is present, without any evidence of aortic stenosis. Pulmonic Valve: The pulmonic valve was normal in structure. Pulmonic valve regurgitation is not visualized. No evidence of pulmonic stenosis. Aorta: The aortic root is normal in size and structure. Venous: The inferior vena cava is dilated in size with less than 50% respiratory variability, suggesting right atrial pressure of 15 mmHg. IAS/Shunts: No atrial level shunt detected by color flow Doppler. Additional Comments: A pacer wire is visualized. LEFT VENTRICLE PLAX 2D LVIDd:         4.60 cm  Diastology LVIDs:         2.60 cm  LV e' medial:    8.70 cm/s LV PW:         1.10 cm  LV E/e' medial:  14.3 LV IVS:        1.10 cm  LV e' lateral:   9.68 cm/s LVOT diam:     2.30 cm  LV E/e' lateral: 12.8 LV SV:         88 LV SV Index:   39 LVOT Area:     4.15 cm  IVC IVC diam: 2.30 cm LEFT ATRIUM         Index LA diam:    5.40 cm 2.40 cm/m  AORTIC VALVE LVOT Vmax:   102.00 cm/s LVOT Vmean:  70.000 cm/s LVOT VTI:    0.211 m  AORTA Ao Asc diam: 3.60 cm MITRAL VALVE MV Area (PHT): 5.27 cm     SHUNTS MV Decel Time: 144 msec  Systemic VTI:  0.21 m MV E velocity: 124.00 cm/s  Systemic Diam: 2.30 cm MV A velocity: 43.50 cm/s MV E/A ratio:  2.85 Candee Furbish MD Electronically signed by Candee Furbish MD Signature Date/Time: 04/17/2020/11:51:16 AM    Final      Imaging independently reviewed in Epic.  Raynelle High[facility]ious [facility] [phone] pager 04/18/2020, 11:12 AM

## 2020-04-18 NOTE — Progress Notes (Signed)
PROGRESS NOTE    Adam Conner  CBJ:[id number] DOB: September 21, 1933 DOA: 04/16/2020 PCP: Patient, No Pcp Per   Brief Narrative:  87 year old male with past medical history of hyperlipidemia, dementia, hypertension, bioprosthetic hyperplasia, pacemaker placement who presents to [facility] emergency department via EMS after they were contacted by family due to progressive confusion. Patient is an extremely poor historian due to advanced dementia.  Unfortunately, there are no outpatient records available and no one is answering our only contact number on file and therefore the majority of the history is been obtained from EMS and emergency department staff.  In the ED patient found to be febrile with leukocytosis and tachycardia concerning for sepsis.  Urinalysis concerning for UTI, antibiotics initiated at intake and patient admitted for further evaluation treatment as below.  Patient incidentally found to be Covid positive, likely not related to his current sepsis findings.  Assessment & Plan:   Principal Problem:   Sepsis (HCC) Active Problems:   Lactic acidosis   Essential hypertension   Benign prostatic hyperplasia   Dementia without behavioral disturbance (HCC)   Presence of cardiac pacemaker   COVID-19 virus infection   Hyperglycemia   Acute cystitis without hematuria   Sepsis secondary to UTI (HCC)   Sepsis secondary to UTI, POA  Concurrent MRSA bacteremia - Patient febrile, leukocytosis, with tachypnea meeting sepsis criteria - Concurrently elevated lactic acidosis, received IV fluids per sepsis protocol - Urinalysis indicative of UTI concurrent with patient's altered mental status, cultures pending - Discntinue IV ceftriaxone, start Vancomycin given prelim BCID results - de-escalate based on sensitivities and findings on urine and blood cultures - Continue to advance diet, patient more awake alert oriented today than previous, holding further IV fluids, patient appears to  be more euvolemic at this point, SIRS criteria resolving  Incidental COVID-19 positive, unclear if acute infection - Chest x-ray shows scant bilateral infiltrate, largely unchanged from previous imaging last week - Continue 3-day treatment of IV Remdesivir, hold off on steroids/immunomodulators in the setting of above bacterial infection - Continue precautions for 10 days per protocol -questionably positive last month per family outpatient test, no available documentation. - Continues without hypoxia shortness of breath cough or wheeze.  - Continue supportive care but currently no indication for incentive spirometry, flutter, proning or respiratory support  Benign prostatic hyperplasia - Continuing home regimen of Flomax - Postvoid residual bladder scan performed in the emergency department reveals no evidence of urinary retention  Dementia without behavioral disturbance (HCC) - Per previous documentation- - Attempting to follow-up with family for baseline - Continue delirium precautions, high risk for sundowning given age and comorbid's above  Presence of cardiac pacemaker - Unclear indication, poor records, no family available, patient remains altered as above   Diabetes mellitus type 2 without complication, questionably newly diagnosed - Distant records mention prediabetes, A1c 6.6 at intake  - Continue sliding scale insulin, hypoglycemic protocol  Code Status:  Full code Family Communication:  None available Status is: Inpatient  Dispo: The patient is from: Home              Anticipated d/c is to: To be determined              Anticipated d/c date is: To be determined              Patient currently not medically stable for discharge  Consultants:   None  Procedures:   None  Antimicrobials:  Ceftriaxone - discontinued 2/25 vancomycin (2/25--present) Remdesivir (  2/25--2/27)  Subjective: No acute issues or events overnight, patient much more awake alert oriented  this morning, denies nausea vomiting diarrhea constipation headache fevers or chills.  Objective: Vitals:   04/17/20 1925 04/17/20 2306 04/18/20 0341 04/18/20 0724  BP: (!) 113/54 (!) 146/68 (!) 141/66 (!) 146/72  Pulse: 65 60 60 64  Resp: 19 18 20 16   Temp: 98.3 F (36.8 C) 98 F (36.7 C) 98.2 F (36.8 C) 97.6 F (36.4 C)  TempSrc: Oral Oral Oral Oral  SpO2: 98% 99% 98% 99%  Weight:      Height:        Intake/Output Summary (Last 24 hours) at 04/18/2020 0747 Last data filed at 04/18/2020 0533 Gross per 24 hour  Intake 313.16 ml  Output 800 ml  Net -486.84 ml   Filed Weights   04/16/20 2056 04/17/20 0422  Weight: 110 kg 108.1 kg    Examination:  General:  Pleasantly resting in bed, No acute distress. HEENT:  Normocephalic atraumatic.  Sclerae nonicteric, noninjected.  Extraocular movements intact bilaterally. Neck:  Without mass or deformity.  Trachea is midline. Lungs:  Clear to auscultate bilaterally without rhonchi, wheeze, or rales. Heart:  Regular rate and rhythm.  Without murmurs, rubs, or gallops. Abdomen:  Soft, nontender, nondistended.  Without guarding or rebound. Extremities: Without cyanosis, clubbing, edema, or obvious deformity. Vascular:  Dorsalis pedis and posterior tibial pulses palpable bilaterally. Skin:  Warm and dry, no erythema, no ulcerations.   Data Reviewed: I have personally reviewed following labs and imaging studies  CBC: Recent Labs  Lab 04/16/20 2129 04/18/20 0059  WBC 22.9* 20.8*  NEUTROABS 18.3* 16.0*  HGB 13.6 12.6*  HCT 42.7 37.2*  MCV 90.7 87.7  PLT 266 220   Basic Metabolic Panel: Recent Labs  Lab 04/16/20 2129 04/18/20 0059  NA 132* 136  K 3.6 2.9*  CL 100 101  CO2 23 24  GLUCOSE 169* 114*  BUN 26* 23  CREATININE 1.40* 1.17  CALCIUM 8.8* 8.5*  MG  --  1.7   GFR: Estimated Creatinine Clearance: 57.8 mL/min (by C-G formula based on SCr of 1.17 mg/dL). Liver Function Tests: Recent Labs  Lab 04/16/20 2129  04/18/20 0059  AST 25 22  ALT 16 17  ALKPHOS 73 73  BILITOT 1.7* 1.7*  PROT 6.6 6.1*  ALBUMIN 3.1* 2.6*   No results for input(s): LIPASE, AMYLASE in the last 168 hours. No results for input(s): AMMONIA in the last 168 hours. Coagulation Profile: Recent Labs  Lab 04/16/20 2129  INR 1.5*   Cardiac Enzymes: No results for input(s): CKTOTAL, CKMB, CKMBINDEX, TROPONINI in the last 168 hours. BNP (last 3 results) No results for input(s): PROBNP in the last 8760 hours. HbA1C: Recent Labs    04/16/20 2129  HGBA1C 6.6*   CBG: Recent Labs  Lab 04/17/20 0805 04/17/20 1216 04/17/20 1647 04/17/20 2020 04/18/20 0727  GLUCAP 138* 130* 120* 139* 126*   Lipid Profile: No results for input(s): CHOL, HDL, LDLCALC, TRIG, CHOLHDL, LDLDIRECT in the last 72 hours. Thyroid Function Tests: No results for input(s): TSH, T4TOTAL, FREET4, T3FREE, THYROIDAB in the last 72 hours. Anemia Panel: No results for input(s): VITAMINB12, FOLATE, FERRITIN, TIBC, IRON, RETICCTPCT in the last 72 hours. Sepsis Labs: Recent Labs  Lab 04/16/20 2129 04/16/20 2317  PROCALCITON 0.31  --   LATICACIDVEN 2.7* 1.8    Recent Results (from the past 240 hour(s))  Blood culture (routine single)     Status: None (Preliminary result)   Collection  Time: 04/16/20  9:29 PM   Specimen: BLOOD  Result Value Ref Range Status   Specimen Description BLOOD LEFT ANTECUBITAL  Final   Special Requests   Final    BOTTLES DRAWN AEROBIC AND ANAEROBIC Blood Culture adequate volume   Culture  Setup Time   Final    GRAM POSITIVE COCCI IN CLUSTERS IN BOTH AEROBIC AND ANAEROBIC BOTTLES Organism ID to follow CRITICAL RESULT CALLED TO, READ BACK BY AND VERIFIED WITHArnaldo Natal PHARMD [id number] 04/17/20 A BROWNING Performed at [facility], [address] [place], [place] [address]    Culture PENDING  Incomplete   Report Status PENDING  Incomplete  Blood Culture ID Panel (Reflexed)     Status: Abnormal   Collection Time:  04/16/20  9:29 PM  Result Value Ref Range Status   Enterococcus faecalis NOT DETECTED NOT DETECTED Final   Enterococcus Faecium NOT DETECTED NOT DETECTED Final   Listeria monocytogenes NOT DETECTED NOT DETECTED Final   Staphylococcus species DETECTED (A) NOT DETECTED Final    Comment: CRITICAL RESULT CALLED TO, READ BACK BY AND VERIFIED WITHArnaldo Natal PHARMD 1645 04/17/20 A BROWNING    Staphylococcus aureus (BCID) DETECTED (A) NOT DETECTED Final    Comment: Methicillin (oxacillin)-resistant Staphylococcus aureus (MRSA). MRSA is predictably resistant to beta-lactam antibiotics (except ceftaroline). Preferred therapy is vancomycin unless clinically contraindicated. Patient requires contact precautions if  hospitalized. CRITICAL RESULT CALLED TO, READ BACK BY AND VERIFIED WITH: Arnaldo Natal PHARMD [id number] 04/17/20 A BROWNING    Staphylococcus epidermidis NOT DETECTED NOT DETECTED Final   Staphylococcus lugdunensis NOT DETECTED NOT DETECTED Final   Streptococcus species NOT DETECTED NOT DETECTED Final   Streptococcus agalactiae NOT DETECTED NOT DETECTED Final   Streptococcus pneumoniae NOT DETECTED NOT DETECTED Final   Streptococcus pyogenes NOT DETECTED NOT DETECTED Final   A.calcoaceticus-baumannii NOT DETECTED NOT DETECTED Final   Bacteroides fragilis NOT DETECTED NOT DETECTED Final   Enterobacterales NOT DETECTED NOT DETECTED Final   Enterobacter cloacae complex NOT DETECTED NOT DETECTED Final   Escherichia coli NOT DETECTED NOT DETECTED Final   Klebsiella aerogenes NOT DETECTED NOT DETECTED Final   Klebsiella oxytoca NOT DETECTED NOT DETECTED Final   Klebsiella pneumoniae NOT DETECTED NOT DETECTED Final   Proteus species NOT DETECTED NOT DETECTED Final   Salmonella species NOT DETECTED NOT DETECTED Final   Serratia marcescens NOT DETECTED NOT DETECTED Final   Haemophilus influenzae NOT DETECTED NOT DETECTED Final   Neisseria meningitidis NOT DETECTED NOT DETECTED Final   Pseudomonas  aeruginosa NOT DETECTED NOT DETECTED Final   Stenotrophomonas maltophilia NOT DETECTED NOT DETECTED Final   Candida albicans NOT DETECTED NOT DETECTED Final   Candida auris NOT DETECTED NOT DETECTED Final   Candida glabrata NOT DETECTED NOT DETECTED Final   Candida krusei NOT DETECTED NOT DETECTED Final   Candida parapsilosis NOT DETECTED NOT DETECTED Final   Candida tropicalis NOT DETECTED NOT DETECTED Final   Cryptococcus neoformans/gattii NOT DETECTED NOT DETECTED Final   Meth resistant mecA/C and MREJ DETECTED (A) NOT DETECTED Final    Comment: CRITICAL RESULT CALLED TO, READ BACK BY AND VERIFIED WITHArnaldo Natal PHARMD 1645 04/17/20 A BROWNING Performed at [facility], [address] [place], [place] [address]   Resp Panel by RT-PCR (Flu A&B, Covid) Nasopharyngeal Swab     Status: Abnormal   Collection Time: 04/16/20 10:35 PM   Specimen: Nasopharyngeal Swab; Nasopharyngeal(NP) swabs in vial transport medium  Result Value Ref Range Status  SARS Coronavirus 2 by RT PCR POSITIVE (A) NEGATIVE Final    Comment: RESULT CALLED TO, READ BACK BY AND VERIFIED WITH: B SANGALANG RN 04/17/20  0022 JDW (NOTE) SARS-CoV-2 target nucleic acids are DETECTED.  The SARS-CoV-2 RNA is generally detectable in upper respiratory specimens during the acute phase of infection. Positive results are indicative of the presence of the identified virus, but do not rule out bacterial infection or co-infection with other pathogens not detected by the test. Clinical correlation with patient history and other diagnostic information is necessary to determine patient infection status. The expected result is Negative.  Fact Sheet for Patients: BloggerCourse.com  Fact Sheet for Healthcare Providers: SeriousBroker.it  This test is not yet approved or cleared by the [place] FDA and  has been authorized for detection and/or diagnosis of SARS-CoV-2 by FDA  under an Emergency Use Authorization (EUA).  This EUA will remain in effect (meaning this test can be  used) for the duration of  the COVID-19 declaration under Section 564(b)(1) of the Act, 21 U.S.C. section 360bbb-3(b)(1), unless the authorization is terminated or revoked sooner.     Influenza A by PCR NEGATIVE NEGATIVE Final   Influenza B by PCR NEGATIVE NEGATIVE Final    Comment: (NOTE) The Xpert Xpress SARS-CoV-2/FLU/RSV plus assay is intended as an aid in the diagnosis of influenza from Nasopharyngeal swab specimens and should not be used as a sole basis for treatment. Nasal washings and aspirates are unacceptable for Xpert Xpress SARS-CoV-2/FLU/RSV testing.  Fact Sheet for Patients: BloggerCourse.com  Fact Sheet for Healthcare Providers: SeriousBroker.it  This test is not yet approved or cleared by the [place] FDA and has been authorized for detection and/or diagnosis of SARS-CoV-2 by FDA under an Emergency Use Authorization (EUA). This EUA will remain in effect (meaning this test can be used) for the duration of the COVID-19 declaration under Section 564(b)(1) of the Act, 21 U.S.C. section 360bbb-3(b)(1), unless the authorization is terminated or revoked.  Performed at [facility], [address] [place], [place] [address]          Radiology Studies: CT ABDOMEN PELVIS W CONTRAST  Result Date: 04/17/2020 CLINICAL DATA:  Fatigue, fever, weakness and confusion. EXAM: CT ABDOMEN AND PELVIS WITH CONTRAST TECHNIQUE: Multidetector CT imaging of the abdomen and pelvis was performed using the standard protocol following bolus administration of intravenous contrast. CONTRAST:  OMNIPAQUE IOHEXOL 300 MG/ML  SOLN COMPARISON:  None. FINDINGS: Lower chest: Mild areas of scarring and/or atelectasis are seen within the bilateral lung bases. Hepatobiliary: No focal liver abnormality is seen. Numerous subcentimeter  gallstones are seen within the lumen of an otherwise normal-appearing gallbladder. Pancreas: Unremarkable. No pancreatic ductal dilatation or surrounding inflammatory changes. Spleen: Normal in size without focal abnormality. Adrenals/Urinary Tract: Adrenal glands are unremarkable. Kidneys are normal, without renal calculi or hydronephrosis. A 4.4 cm x 3.7 cm simple cyst is seen within the lateral aspect of the mid left kidney. An additional 2.2 cm x 1.5 cm exophytic simple cyst is seen along the anterior aspect of the mid left kidney. A 0.7 cm right renal cyst is also seen. Bladder is unremarkable. Stomach/Bowel: There is a small hiatal hernia. Appendix appears normal. No evidence of bowel wall thickening, distention, or inflammatory changes. Noninflamed diverticula are seen throughout the large bowel. Vascular/Lymphatic: There is marked severity aortic calcification with 4.1 cm x 3.8 cm aneurysmal dilatation of the infrarenal abdominal aorta. There is no evidence of aortic dissection. There is mild para-aortic and  aortocaval lymphadenopathy. The largest lymph node measures approximately 2.2 cm x 1.1 cm. Reproductive: Multiple prostate radiation implantation seeds are seen. Other: Small, bilateral fat containing inguinal hernias are seen. No abdominopelvic ascites. Musculoskeletal: No acute or significant osseous findings. IMPRESSION: 1. Cholelithiasis without evidence of cholecystitis. 2. 4.1 cm x 3.8 cm infrarenal abdominal aortic aneurysm. 3. Colonic diverticulosis. 4. Small hiatal hernia. 5. Small, bilateral fat containing inguinal hernias. 6. Aortic atherosclerosis. Aortic Atherosclerosis (ICD10-I70.0). Electronically Signed   By: Aram Candela M.D.   On: 04/17/2020 01:58   DG Chest Port 1 View  Result Date: 04/16/2020 CLINICAL DATA:  Upper abdominal pain, fatigue, sepsis EXAM: PORTABLE CHEST 1 VIEW COMPARISON:  03/11/2020 FINDINGS: Single frontal view of the chest demonstrates stable single lead  pacer. Cardiac silhouette is enlarged but stable. Continued central vascular congestion without airspace disease, effusion, or pneumothorax. IMPRESSION: 1. Chronic central vascular congestion.  No acute airspace disease. Electronically Signed   By: Sharlet Salina M.D.   On: 04/16/2020 21:35   ECHOCARDIOGRAM LIMITED  Result Date: 04/17/2020    ECHOCARDIOGRAM LIMITED REPORT   Patient Name:   Adam Conner Date of Exam: 04/17/2020 Medical Rec #:  [id number]     Height:       70.0 in Accession #:    [id number]    Weight:       238.3 lb Date of Birth:  1934/02/14    BSA:          2.249 m Patient Age:    86 years      BP:           141/66 mmHg Patient Gender: M             HR:           60 bpm. Exam Location:  Inpatient Procedure: 2D Echo Indications:    acute systolic chf.  History:        Patient has no prior history of Echocardiogram examinations.                 Pacemaker, Covid. Sepsis; Risk Factors:Hypertension.  Sonographer:    Delcie Roch Referring Phys: [id number] Marinda Elk  Sonographer Comments: Image acquisition challenging due to uncooperative patient. IMPRESSIONS  1. Left ventricular ejection fraction, by estimation, is 60 to 65%. The left ventricle has normal function. The left ventricle has no regional wall motion abnormalities. There is mild left ventricular hypertrophy.  2. Right ventricular systolic function is normal. The right ventricular size is normal.  3. Left atrial size was moderately dilated.  4. The mitral valve is normal in structure. Trivial mitral valve regurgitation. No evidence of mitral stenosis.  5. The aortic valve is normal in structure. There is moderate calcification of the aortic valve. There is moderate thickening of the aortic valve. Aortic valve regurgitation is not visualized. Mild to moderate aortic valve sclerosis/calcification is present, without any evidence of aortic stenosis.  6. The inferior vena cava is dilated in size with <50% respiratory variability,  suggesting right atrial pressure of 15 mmHg. FINDINGS  Left Ventricle: Left ventricular ejection fraction, by estimation, is 60 to 65%. The left ventricle has normal function. The left ventricle has no regional wall motion abnormalities. The left ventricular internal cavity size was normal in size. There is  mild left ventricular hypertrophy. Right Ventricle: The right ventricular size is normal. No increase in right ventricular wall thickness. Right ventricular systolic function is normal. Left Atrium: Left atrial size was moderately dilated. Right  Atrium: Right atrial size was normal in size. Pericardium: There is no evidence of pericardial effusion. Mitral Valve: The mitral valve is normal in structure. Trivial mitral valve regurgitation. No evidence of mitral valve stenosis. Tricuspid Valve: The tricuspid valve is normal in structure. Tricuspid valve regurgitation is not demonstrated. No evidence of tricuspid stenosis. Aortic Valve: The aortic valve is normal in structure. There is moderate calcification of the aortic valve. There is moderate thickening of the aortic valve. Aortic valve regurgitation is not visualized. Mild to moderate aortic valve sclerosis/calcification is present, without any evidence of aortic stenosis. Pulmonic Valve: The pulmonic valve was normal in structure. Pulmonic valve regurgitation is not visualized. No evidence of pulmonic stenosis. Aorta: The aortic root is normal in size and structure. Venous: The inferior vena cava is dilated in size with less than 50% respiratory variability, suggesting right atrial pressure of 15 mmHg. IAS/Shunts: No atrial level shunt detected by color flow Doppler. Additional Comments: A pacer wire is visualized. LEFT VENTRICLE PLAX 2D LVIDd:         4.60 cm  Diastology LVIDs:         2.60 cm  LV e' medial:    8.70 cm/s LV PW:         1.10 cm  LV E/e' medial:  14.3 LV IVS:        1.10 cm  LV e' lateral:   9.68 cm/s LVOT diam:     2.30 cm  LV E/e' lateral:  12.8 LV SV:         88 LV SV Index:   39 LVOT Area:     4.15 cm  IVC IVC diam: 2.30 cm LEFT ATRIUM         Index LA diam:    5.40 cm 2.40 cm/m  AORTIC VALVE LVOT Vmax:   102.00 cm/s LVOT Vmean:  70.000 cm/s LVOT VTI:    0.211 m  AORTA Ao Asc diam: 3.60 cm MITRAL VALVE MV Area (PHT): 5.27 cm     SHUNTS MV Decel Time: 144 msec     Systemic VTI:  0.21 m MV E velocity: 124.00 cm/s  Systemic Diam: 2.30 cm MV A velocity: 43.50 cm/s MV E/A ratio:  2.85 Donato Schultz MD Electronically signed by Donato Schultz MD Signature Date/Time: 04/17/2020/11:51:16 AM    Final         Scheduled Meds: . vitamin C  500 mg Oral Daily  . enoxaparin (LOVENOX) injection  40 mg Subcutaneous QHS  . insulin aspart  0-9 Units Subcutaneous TID AC & HS  . tamsulosin  0.4 mg Oral QPC breakfast  . zinc sulfate  220 mg Oral Daily   Continuous Infusions: . potassium chloride 10 mEq (04/18/20 0739)  . remdesivir 100 mg in NS 100 mL    . vancomycin       LOS: 1 day    Time spent: 40  Azucena Fallen, DO [facility]  If 7PM-7AM, please contact night-coverage [email or web address]  04/18/2020, 7:47 AM

## 2020-04-19 DIAGNOSIS — N3000 Acute cystitis without hematuria: Secondary | ICD-10-CM | POA: Diagnosis not present

## 2020-04-19 DIAGNOSIS — F0390 Unspecified dementia without behavioral disturbance: Secondary | ICD-10-CM | POA: Diagnosis not present

## 2020-04-19 DIAGNOSIS — U071 COVID-19: Secondary | ICD-10-CM | POA: Diagnosis not present

## 2020-04-19 DIAGNOSIS — A419 Sepsis, unspecified organism: Secondary | ICD-10-CM | POA: Diagnosis not present

## 2020-04-19 LAB — CBC WITH DIFFERENTIAL/PLATELET
Abs Immature Granulocytes: 0.09 10*3/uL — ABNORMAL HIGH (ref 0.00–0.07)
Basophils Absolute: 0 10*3/uL (ref 0.0–0.1)
Basophils Relative: 0 %
Eosinophils Absolute: 0.2 10*3/uL (ref 0.0–0.5)
Eosinophils Relative: 2 %
HCT: 40 % (ref 39.0–52.0)
Hemoglobin: 13 g/dL (ref 13.0–17.0)
Immature Granulocytes: 1 %
Lymphocytes Relative: 12 %
Lymphs Abs: 1.7 10*3/uL (ref 0.7–4.0)
MCH: 29 pg (ref 26.0–34.0)
MCHC: 32.5 g/dL (ref 30.0–36.0)
MCV: 89.3 fL (ref 80.0–100.0)
Monocytes Absolute: 1.8 10*3/uL — ABNORMAL HIGH (ref 0.1–1.0)
Monocytes Relative: 13 %
Neutro Abs: 10.3 10*3/uL — ABNORMAL HIGH (ref 1.7–7.7)
Neutrophils Relative %: 72 %
Platelets: 235 10*3/uL (ref 150–400)
RBC: 4.48 MIL/uL (ref 4.22–5.81)
RDW: 13.3 % (ref 11.5–15.5)
WBC: 14.1 10*3/uL — ABNORMAL HIGH (ref 4.0–10.5)
nRBC: 0 % (ref 0.0–0.2)

## 2020-04-19 LAB — COMPREHENSIVE METABOLIC PANEL
ALT: 27 U/L (ref 0–44)
AST: 37 U/L (ref 15–41)
Albumin: 2.6 g/dL — ABNORMAL LOW (ref 3.5–5.0)
Alkaline Phosphatase: 66 U/L (ref 38–126)
Anion gap: 10 (ref 5–15)
BUN: 25 mg/dL — ABNORMAL HIGH (ref 8–23)
CO2: 25 mmol/L (ref 22–32)
Calcium: 8.8 mg/dL — ABNORMAL LOW (ref 8.9–10.3)
Chloride: 100 mmol/L (ref 98–111)
Creatinine, Ser: 0.97 mg/dL (ref 0.61–1.24)
GFR, Estimated: >60 mL/min (ref >60)
Glucose, Bld: 122 mg/dL — ABNORMAL HIGH (ref 70–99)
Potassium: 3.2 mmol/L — ABNORMAL LOW (ref 3.5–5.1)
Sodium: 135 mmol/L (ref 135–145)
Total Bilirubin: 1.2 mg/dL (ref 0.3–1.2)
Total Protein: 6.3 g/dL — ABNORMAL LOW (ref 6.5–8.1)

## 2020-04-19 LAB — CULTURE, BLOOD (SINGLE): Special Requests: ADEQUATE

## 2020-04-19 LAB — GLUCOSE, CAPILLARY
Glucose-Capillary: 109 mg/dL — ABNORMAL HIGH (ref 70–99)
Glucose-Capillary: 126 mg/dL — ABNORMAL HIGH (ref 70–99)
Glucose-Capillary: 124 mg/dL — ABNORMAL HIGH (ref 70–99)
Glucose-Capillary: 139 mg/dL — ABNORMAL HIGH (ref 70–99)

## 2020-04-19 LAB — SEDIMENTATION RATE: Sed Rate: 47 mm/hr — ABNORMAL HIGH (ref 0–16)

## 2020-04-19 LAB — C-REACTIVE PROTEIN: CRP: 19.8 mg/dL — ABNORMAL HIGH (ref <1.0)

## 2020-04-19 LAB — D-DIMER, QUANTITATIVE: D-Dimer, Quant: 0.69 ug/mL-FEU — ABNORMAL HIGH (ref 0.00–0.50)

## 2020-04-19 MED ORDER — POTASSIUM CHLORIDE CRYS ER 20 MEQ PO TBCR
40.0000 meq | EXTENDED_RELEASE_TABLET | Freq: Once | ORAL | Status: AC
  Administered 2020-04-19: 40 meq via ORAL
  Filled 2020-04-19: qty 2

## 2020-04-19 NOTE — Progress Notes (Signed)
PROGRESS NOTE    Adam Conner  VHQ:[id number] DOB: 24-Sep-1933 DOA: 04/16/2020 PCP: Patient, No Pcp Per   Brief Narrative:  87 year old male with past medical history of hyperlipidemia, dementia, hypertension, bioprosthetic hyperplasia, pacemaker placement who presents to [facility] emergency department via EMS after they were contacted by family due to progressive confusion. Patient is an extremely poor historian due to advanced dementia.  Unfortunately, there are no outpatient records available and no one is answering our only contact number on file and therefore the majority of the history is been obtained from EMS and emergency department staff.  In the ED patient found to be febrile with leukocytosis and tachycardia concerning for sepsis.  Urinalysis concerning for UTI, antibiotics initiated at intake and patient admitted for further evaluation treatment as below.  Patient incidentally found to be Covid positive, likely not related to his current sepsis findings.  Assessment & Plan:   Principal Problem:   Sepsis (HCC) Active Problems:   Lactic acidosis   Essential hypertension   Benign prostatic hyperplasia   Dementia without behavioral disturbance (HCC)   Presence of cardiac pacemaker   COVID-19 virus infection   Hyperglycemia   Acute cystitis without hematuria   Sepsis secondary to UTI (HCC)   Sepsis secondary to UTI, POA  Concurrent MRSA bacteremia - Patient febrile, leukocytosis, with tachypnea meeting sepsis criteria - Concurrently elevated lactic acidosis, received IV fluids per sepsis protocol - Urinalysis indicative of UTI concurrent with patient's altered mental status, cultures pending - Discontinue IV ceftriaxone, start Vancomycin given prelim BCID results - de-escalate based on sensitivities and findings on urine and blood cultures - Continue to advance diet, patient more awake alert oriented today than previous, holding further IV fluids, patient appears to  be more euvolemic at this point, SIRS criteria resolving  Acute metabolic encephalopathy on dementia without behavioral disturbance (HCC), POA -Per family baseline is alert oriented -admitted patient was essentially ANO x0 -waxing and waning but generally improving mental status over the past 48 hours -Not yet back to baseline per discussion with daughter - Continue delirium precautions, high risk for sundowning given age and comorbid's above  Incidental COVID-19 positive, unclear if acute infection - Chest x-ray shows scant bilateral infiltrate, largely unchanged from previous imaging last week -Completed 3-day course of Remdesivir, hold off on steroids/immunomodulators in the setting of above bacterial infection - Continue precautions for 10 days per protocol -questionably positive last month per family outpatient test, no available documentation. - Continues without hypoxia shortness of breath cough or wheeze.  - Continue supportive care but currently no indication for incentive spirometry, flutter, proning or respiratory support  Benign prostatic hyperplasia - Continue Flomax - Postvoid residual bladder scan performed in the emergency department reveals no evidence of urinary retention  Presence of cardiac pacemaker - Unclear indication, poor records, no family available, patient remains altered as above   Diabetes mellitus type 2 without complication, questionably newly diagnosed - Distant records mention prediabetes, A1c 6.6 at intake  - Continue sliding scale insulin, hypoglycemic protocol  Code Status:  Full code Family Communication:  None available Status is: Inpatient  Dispo: The patient is from: Home              Anticipated d/c is to: To be determined              Anticipated d/c date is: 24 to 48 hours              Patient currently not medically  stable for discharge given ongoing waxing and waning mental status, need for ongoing IV antibiotics  Consultants:    None  Procedures:   None  Antimicrobials:  Ceftriaxone - discontinued 2/25 vancomycin (2/25--present) Remdesivir (2/25--2/27)  Subjective: No acute issues or events overnight, patient mental status somewhat more depressed than yesterday but certainly improved from admission.  Review of systems somewhat limited but patient denies any overt chest pain shortness of breath nausea vomiting or diarrhea.  Objective: Vitals:   04/18/20 0724 04/18/20 1508 04/18/20 2029 04/19/20 0440  BP: (!) 146/72 132/69 (!) 154/83 (!) 156/87  Pulse: 64 62 60 62  Resp: 16 18 18 20   Temp: 97.6 F (36.4 C) 97.8 F (36.6 C) 97.6 F (36.4 C) 97.9 F (36.6 C)  TempSrc: Oral Oral Oral Oral  SpO2: 99% 100% 90% 100%  Weight:      Height:        Intake/Output Summary (Last 24 hours) at 04/19/2020 0734 Last data filed at 04/19/2020 0407 Gross per 24 hour  Intake 754.55 ml  Output 1450 ml  Net -695.45 ml   Filed Weights   04/16/20 2056 04/17/20 0422  Weight: 110 kg 108.1 kg    Examination:  General:  Pleasantly resting in bed, No acute distress. HEENT:  Normocephalic atraumatic.  Sclerae nonicteric, noninjected.  Extraocular movements intact bilaterally. Neck:  Without mass or deformity.  Trachea is midline. Lungs:  Clear to auscultate bilaterally without rhonchi, wheeze, or rales. Heart:  Regular rate and rhythm.  Without murmurs, rubs, or gallops. Abdomen:  Soft, nontender, nondistended.  Without guarding or rebound. Extremities: Without cyanosis, clubbing, edema, or obvious deformity. Vascular:  Dorsalis pedis and posterior tibial pulses palpable bilaterally. Skin:  Warm and dry, no erythema, no ulcerations.   Data Reviewed: I have personally reviewed following labs and imaging studies  CBC: Recent Labs  Lab 04/16/20 2129 04/18/20 0059 04/19/20 0321  WBC 22.9* 20.8* 14.1*  NEUTROABS 18.3* 16.0* 10.3*  HGB 13.6 12.6* 13.0  HCT 42.7 37.2* 40.0  MCV 90.7 87.7 89.3  PLT 266 220 235    Basic Metabolic Panel: Recent Labs  Lab 04/16/20 2129 04/18/20 0059 04/19/20 0321  NA 132* 136 135  K 3.6 2.9* 3.2*  CL 100 101 100  CO2 23 24 25   GLUCOSE 169* 114* 122*  BUN 26* 23 25*  CREATININE 1.40* 1.17 0.97  CALCIUM 8.8* 8.5* 8.8*  MG  --  1.7  --    GFR: Estimated Creatinine Clearance: 69.8 mL/min (by C-G formula based on SCr of 0.97 mg/dL). Liver Function Tests: Recent Labs  Lab 04/16/20 2129 04/18/20 0059 04/19/20 0321  AST 25 22 37  ALT 16 17 27   ALKPHOS 73 73 66  BILITOT 1.7* 1.7* 1.2  PROT 6.6 6.1* 6.3*  ALBUMIN 3.1* 2.6* 2.6*   No results for input(s): LIPASE, AMYLASE in the last 168 hours. No results for input(s): AMMONIA in the last 168 hours. Coagulation Profile: Recent Labs  Lab 04/16/20 2129  INR 1.5*   Cardiac Enzymes: No results for input(s): CKTOTAL, CKMB, CKMBINDEX, TROPONINI in the last 168 hours. BNP (last 3 results) No results for input(s): PROBNP in the last 8760 hours. HbA1C: Recent Labs    04/16/20 2129  HGBA1C 6.6*   CBG: Recent Labs  Lab 04/18/20 0727 04/18/20 1145 04/18/20 1717 04/18/20 2033 04/19/20 0720  GLUCAP 126* 135* 121* 126* 109*   Lipid Profile: No results for input(s): CHOL, HDL, LDLCALC, TRIG, CHOLHDL, LDLDIRECT in the last 72 hours. Thyroid  Function Tests: No results for input(s): TSH, T4TOTAL, FREET4, T3FREE, THYROIDAB in the last 72 hours. Anemia Panel: No results for input(s): VITAMINB12, FOLATE, FERRITIN, TIBC, IRON, RETICCTPCT in the last 72 hours. Sepsis Labs: Recent Labs  Lab 04/16/20 2129 04/16/20 2317  PROCALCITON 0.31  --   LATICACIDVEN 2.7* 1.8    Recent Results (from the past 240 hour(s))  Blood culture (routine single)     Status: Abnormal (Preliminary result)   Collection Time: 04/16/20  9:29 PM   Specimen: BLOOD  Result Value Ref Range Status   Specimen Description BLOOD LEFT ANTECUBITAL  Final   Special Requests   Final    BOTTLES DRAWN AEROBIC AND ANAEROBIC Blood Culture  adequate volume   Culture  Setup Time   Final    GRAM POSITIVE COCCI IN CLUSTERS IN BOTH AEROBIC AND ANAEROBIC BOTTLES CRITICAL RESULT CALLED TO, READ BACK BY AND VERIFIED WITH: Arnaldo Natal PHARMD 1645 04/17/20 A BROWNING    Culture (A)  Final    STAPHYLOCOCCUS AUREUS SUSCEPTIBILITIES TO FOLLOW Performed at [facility], [address], [place], [place] [address]    Report Status PENDING  Incomplete  Blood Culture ID Panel (Reflexed)     Status: Abnormal   Collection Time: 04/16/20  9:29 PM  Result Value Ref Range Status   Enterococcus faecalis NOT DETECTED NOT DETECTED Final   Enterococcus Faecium NOT DETECTED NOT DETECTED Final   Listeria monocytogenes NOT DETECTED NOT DETECTED Final   Staphylococcus species DETECTED (A) NOT DETECTED Final    Comment: CRITICAL RESULT CALLED TO, READ BACK BY AND VERIFIED WITHArnaldo Natal PHARMD 1645 04/17/20 A BROWNING    Staphylococcus aureus (BCID) DETECTED (A) NOT DETECTED Final    Comment: Methicillin (oxacillin)-resistant Staphylococcus aureus (MRSA). MRSA is predictably resistant to beta-lactam antibiotics (except ceftaroline). Preferred therapy is vancomycin unless clinically contraindicated. Patient requires contact precautions if  hospitalized. CRITICAL RESULT CALLED TO, READ BACK BY AND VERIFIED WITH: Arnaldo Natal PHARMD [id number] 04/17/20 A BROWNING    Staphylococcus epidermidis NOT DETECTED NOT DETECTED Final   Staphylococcus lugdunensis NOT DETECTED NOT DETECTED Final   Streptococcus species NOT DETECTED NOT DETECTED Final   Streptococcus agalactiae NOT DETECTED NOT DETECTED Final   Streptococcus pneumoniae NOT DETECTED NOT DETECTED Final   Streptococcus pyogenes NOT DETECTED NOT DETECTED Final   A.calcoaceticus-baumannii NOT DETECTED NOT DETECTED Final   Bacteroides fragilis NOT DETECTED NOT DETECTED Final   Enterobacterales NOT DETECTED NOT DETECTED Final   Enterobacter cloacae complex NOT DETECTED NOT DETECTED Final   Escherichia coli NOT  DETECTED NOT DETECTED Final   Klebsiella aerogenes NOT DETECTED NOT DETECTED Final   Klebsiella oxytoca NOT DETECTED NOT DETECTED Final   Klebsiella pneumoniae NOT DETECTED NOT DETECTED Final   Proteus species NOT DETECTED NOT DETECTED Final   Salmonella species NOT DETECTED NOT DETECTED Final   Serratia marcescens NOT DETECTED NOT DETECTED Final   Haemophilus influenzae NOT DETECTED NOT DETECTED Final   Neisseria meningitidis NOT DETECTED NOT DETECTED Final   Pseudomonas aeruginosa NOT DETECTED NOT DETECTED Final   Stenotrophomonas maltophilia NOT DETECTED NOT DETECTED Final   Candida albicans NOT DETECTED NOT DETECTED Final   Candida auris NOT DETECTED NOT DETECTED Final   Candida glabrata NOT DETECTED NOT DETECTED Final   Candida krusei NOT DETECTED NOT DETECTED Final   Candida parapsilosis NOT DETECTED NOT DETECTED Final   Candida tropicalis NOT DETECTED NOT DETECTED Final   Cryptococcus neoformans/gattii NOT DETECTED NOT DETECTED Final   Meth resistant mecA/C  and MREJ DETECTED (A) NOT DETECTED Final    Comment: CRITICAL RESULT CALLED TO, READ BACK BY AND VERIFIED WITHArnaldo Natal: J CARNEY PHARMD [id number] 04/17/20 A BROWNING Performed at [facility], [address] [place], [place] [address]   Urine culture     Status: Abnormal (Preliminary result)   Collection Time: 04/16/20 10:30 PM   Specimen: In/Out Cath Urine  Result Value Ref Range Status   Specimen Description IN/OUT CATH URINE  Final   Special Requests NONE  Final   Culture (A)  Final    >=100,000 COLONIES/mL PROTEUS MIRABILIS SUSCEPTIBILITIES TO FOLLOW CULTURE REINCUBATED FOR BETTER GROWTH Performed at [facility], [address] [place], [place] [address]    Report Status PENDING  Incomplete  Resp Panel by RT-PCR (Flu A&B, Covid) Nasopharyngeal Swab     Status: Abnormal   Collection Time: 04/16/20 10:35 PM   Specimen: Nasopharyngeal Swab; Nasopharyngeal(NP) swabs in vial transport medium  Result Value Ref Range  Status   SARS Coronavirus 2 by RT PCR POSITIVE (A) NEGATIVE Final    Comment: RESULT CALLED TO, READ BACK BY AND VERIFIED WITH: B SANGALANG RN 04/17/20  0022 JDW (NOTE) SARS-CoV-2 target nucleic acids are DETECTED.  The SARS-CoV-2 RNA is generally detectable in upper respiratory specimens during the acute phase of infection. Positive results are indicative of the presence of the identified virus, but do not rule out bacterial infection or co-infection with other pathogens not detected by the test. Clinical correlation with patient history and other diagnostic information is necessary to determine patient infection status. The expected result is Negative.  Fact Sheet for Patients: BloggerCourse.comhttps://www.fda.gov/media/152166/download  Fact Sheet for Healthcare Providers: SeriousBroker.ithttps://www.fda.gov/media/152162/download  This test is not yet approved or cleared by the [place] FDA and  has been authorized for detection and/or diagnosis of SARS-CoV-2 by FDA under an Emergency Use Authorization (EUA).  This EUA will remain in effect (meaning this test can be  used) for the duration of  the COVID-19 declaration under Section 564(b)(1) of the Act, 21 U.S.C. section 360bbb-3(b)(1), unless the authorization is terminated or revoked sooner.     Influenza A by PCR NEGATIVE NEGATIVE Final   Influenza B by PCR NEGATIVE NEGATIVE Final    Comment: (NOTE) The Xpert Xpress SARS-CoV-2/FLU/RSV plus assay is intended as an aid in the diagnosis of influenza from Nasopharyngeal swab specimens and should not be used as a sole basis for treatment. Nasal washings and aspirates are unacceptable for Xpert Xpress SARS-CoV-2/FLU/RSV testing.  Fact Sheet for Patients: BloggerCourse.comhttps://www.fda.gov/media/152166/download  Fact Sheet for Healthcare Providers: SeriousBroker.ithttps://www.fda.gov/media/152162/download  This test is not yet approved or cleared by the [place] FDA and has been authorized for detection and/or diagnosis of  SARS-CoV-2 by FDA under an Emergency Use Authorization (EUA). This EUA will remain in effect (meaning this test can be used) for the duration of the COVID-19 declaration under Section 564(b)(1) of the Act, 21 U.S.C. section 360bbb-3(b)(1), unless the authorization is terminated or revoked.  Performed at [facility], [address] [place], [place] [address]          Radiology Studies: DG Foot 2 Views Left  Result Date: 04/18/2020 CLINICAL DATA:  Diabetic foot ulcer. EXAM: LEFT FOOT - 2 VIEW COMPARISON:  None. FINDINGS: There is no evidence of fracture or dislocation. There is no evidence of arthropathy or other focal bone abnormality. Soft tissues are unremarkable. IMPRESSION: Negative. Electronically Signed   By: Lupita RaiderJames  Green Jr M.D.   On: 04/18/2020  15:09   ECHOCARDIOGRAM LIMITED  Result Date: 04/17/2020    ECHOCARDIOGRAM LIMITED REPORT   Patient Name:   Adam Conner Date of Exam: 04/17/2020 Medical Rec #:  [id number]     Height:       70.0 in Accession #:    [id number]    Weight:       238.3 lb Date of Birth:  08-09-1933    BSA:          2.249 m Patient Age:    86 years      BP:           141/66 mmHg Patient Gender: M             HR:           60 bpm. Exam Location:  Inpatient Procedure: 2D Echo Indications:    acute systolic chf.  History:        Patient has no prior history of Echocardiogram examinations.                 Pacemaker, Covid. Sepsis; Risk Factors:Hypertension.  Sonographer:    Delcie Roch Referring Phys: [id number] Marinda Elk  Sonographer Comments: Image acquisition challenging due to uncooperative patient. IMPRESSIONS  1. Left ventricular ejection fraction, by estimation, is 60 to 65%. The left ventricle has normal function. The left ventricle has no regional wall motion abnormalities. There is mild left ventricular hypertrophy.  2. Right ventricular systolic function is normal. The right ventricular size is normal.  3. Left atrial size was moderately  dilated.  4. The mitral valve is normal in structure. Trivial mitral valve regurgitation. No evidence of mitral stenosis.  5. The aortic valve is normal in structure. There is moderate calcification of the aortic valve. There is moderate thickening of the aortic valve. Aortic valve regurgitation is not visualized. Mild to moderate aortic valve sclerosis/calcification is present, without any evidence of aortic stenosis.  6. The inferior vena cava is dilated in size with <50% respiratory variability, suggesting right atrial pressure of 15 mmHg. FINDINGS  Left Ventricle: Left ventricular ejection fraction, by estimation, is 60 to 65%. The left ventricle has normal function. The left ventricle has no regional wall motion abnormalities. The left ventricular internal cavity size was normal in size. There is  mild left ventricular hypertrophy. Right Ventricle: The right ventricular size is normal. No increase in right ventricular wall thickness. Right ventricular systolic function is normal. Left Atrium: Left atrial size was moderately dilated. Right Atrium: Right atrial size was normal in size. Pericardium: There is no evidence of pericardial effusion. Mitral Valve: The mitral valve is normal in structure. Trivial mitral valve regurgitation. No evidence of mitral valve stenosis. Tricuspid Valve: The tricuspid valve is normal in structure. Tricuspid valve regurgitation is not demonstrated. No evidence of tricuspid stenosis. Aortic Valve: The aortic valve is normal in structure. There is moderate calcification of the aortic valve. There is moderate thickening of the aortic valve. Aortic valve regurgitation is not visualized. Mild to moderate aortic valve sclerosis/calcification is present, without any evidence of aortic stenosis. Pulmonic Valve: The pulmonic valve was normal in structure. Pulmonic valve regurgitation is not visualized. No evidence of pulmonic stenosis. Aorta: The aortic root is normal in size and structure.  Venous: The inferior vena cava is dilated in size with less than 50% respiratory variability, suggesting right atrial pressure of 15 mmHg. IAS/Shunts: No atrial level shunt detected by color flow Doppler. Additional Comments: A pacer wire is visualized. LEFT  VENTRICLE PLAX 2D LVIDd:         4.60 cm  Diastology LVIDs:         2.60 cm  LV e' medial:    8.70 cm/s LV PW:         1.10 cm  LV E/e' medial:  14.3 LV IVS:        1.10 cm  LV e' lateral:   9.68 cm/s LVOT diam:     2.30 cm  LV E/e' lateral: 12.8 LV SV:         88 LV SV Index:   39 LVOT Area:     4.15 cm  IVC IVC diam: 2.30 cm LEFT ATRIUM         Index LA diam:    5.40 cm 2.40 cm/m  AORTIC VALVE LVOT Vmax:   102.00 cm/s LVOT Vmean:  70.000 cm/s LVOT VTI:    0.211 m  AORTA Ao Asc diam: 3.60 cm MITRAL VALVE MV Area (PHT): 5.27 cm     SHUNTS MV Decel Time: 144 msec     Systemic VTI:  0.21 m MV E velocity: 124.00 cm/s  Systemic Diam: 2.30 cm MV A velocity: 43.50 cm/s MV E/A ratio:  2.85 Donato Schultz MD Electronically signed by Donato Schultz MD Signature Date/Time: 04/17/2020/11:51:16 AM    Final         Scheduled Meds: . vitamin C  500 mg Oral Daily  . enoxaparin (LOVENOX) injection  40 mg Subcutaneous QHS  . insulin aspart  0-9 Units Subcutaneous TID AC & HS  . tamsulosin  0.4 mg Oral QPC breakfast  . zinc sulfate  220 mg Oral Daily   Continuous Infusions: . remdesivir 100 mg in NS 100 mL 100 mg (04/18/20 1259)  . vancomycin Stopped (04/18/20 1950)     LOS: 2 days    Time spent: 40  Azucena Fallen, DO [facility]  If 7PM-7AM, please contact night-coverage [email or web address]  04/19/2020, 7:34 AM

## 2020-04-19 NOTE — Progress Notes (Signed)
Occupational Therapy Evaluation Patient Details Name: Adam Conner MRN: [id number] DOB: [birth year] Today's Date: 04/19/2020    History of Present Illness 87 y.o. male with past medical history of hyperlipidemia, hypertension, BPH, dementia, status post pacemaker placement presenting to [facility] via EMS after they were contacted by family members due to noted progressive confusion from the patient. Pt admitted with dx of sepsis. Incidentally found to be covid+.   Clinical Impression   Pt PTA: pt living at home with family providing near 24/7 care for pt with PCA as well for assist with ADL and mobility. Pt currently, performing ADL functional mobility in room with RW and minguardA for mobility. Pt following commands well. Pt set-upA to maxA for ADL. Pt would benefit from continued OT skiled services for ADL and energy conservation techniques. OT following acutely.   Follow Up Recommendations  No OT follow up;Supervision/Assistance - 24 hour    Equipment Recommendations  3 in 1 bedside commode    Recommendations for Other Services       Precautions / Restrictions Precautions Precautions: Fall Restrictions Weight Bearing Restrictions: No      Mobility Bed Mobility Overal bed mobility: Needs Assistance Bed Mobility: Supine to Sit     Supine to sit: Min assist;HOB elevated     General bed mobility comments: +rail, increased time, cues for sequencing for hand placement and movement of BLEs    Transfers Overall transfer level: Needs assistance Equipment used: Rolling walker (2 wheeled) Transfers: Sit to/from Stand Sit to Stand: Min guard         General transfer comment: cues for hand placement, assist to power up and stabilize balance    Balance Overall balance assessment: Needs assistance Sitting-balance support: No upper extremity supported;Feet supported Sitting balance-Leahy Scale: Fair Sitting balance - Comments: supervisionA; no attempt to donn  socks                                   ADL either performed or assessed with clinical judgement   ADL Overall ADL's : At baseline                                       General ADL Comments: Pt performing ADL functional mobility in room with RW and no physical assist for mobility. Pt transferring to [facility] over commode for BM unknowingly started in bed prior to sit to stand. pt able to perform LB toileting, but assist required to clean area. Pt performing grooming in recliner and set-upA required for self feeding. Pt following commands well.     Vision Baseline Vision/History: No visual deficits Patient Visual Report: No change from baseline Vision Assessment?: No apparent visual deficits     Perception     Praxis      Pertinent Vitals/Pain Pain Assessment: No/denies pain     Hand Dominance Right   Extremity/Trunk Assessment Upper Extremity Assessment Upper Extremity Assessment: Overall WFL for tasks assessed   Lower Extremity Assessment Lower Extremity Assessment: Generalized weakness   Cervical / Trunk Assessment Cervical / Trunk Assessment: Normal   Communication Communication Communication: No difficulties   Cognition Arousal/Alertness: Awake/alert Behavior During Therapy: WFL for tasks assessed/performed Overall Cognitive Status: No family/caregiver present to determine baseline cognitive functioning Area of Impairment: Safety/judgement;Memory;Problem solving;Awareness;Orientation;Attention;Following commands  Orientation Level: Disoriented to;Situation;Time Current Attention Level: Selective Memory: Decreased short-term memory;Decreased recall of precautions Following Commands: Follows one step commands consistently Safety/Judgement: Decreased awareness of safety Awareness: Emergent Problem Solving: Slow processing;Difficulty sequencing;Requires verbal cues General Comments: Advanced dementia at baseline.  Presented to ED due to increased confusion. Pt following commands with mulitmodal cues.   General Comments  VSS on RA.    Exercises     Shoulder Instructions      Home Living Family/patient expects to be discharged to:: Private residence Living Arrangements: Alone Available Help at Discharge: Family;Personal care attendant;Available 24 hours/day Type of Home: House Home Access: Level entry     Home Layout: One level     Bathroom Shower/Tub: Chief Strategy Officer: Standard     Home Equipment: Cane - single point;Walker - 4 wheels   Additional Comments: PT spoke with pt's daughter via phone to confirm PLOF and home set up.      Prior Functioning/Environment Level of Independence: Needs assistance  Gait / Transfers Assistance Needed: ambulates with cane ADL's / Homemaking Assistance Needed: assist as needed from PCA and family            OT Problem List: Decreased activity tolerance;Decreased safety awareness;Impaired balance (sitting and/or standing);Increased edema      OT Treatment/Interventions: Self-care/ADL training;Therapeutic exercise;Energy conservation;DME and/or AE instruction;Therapeutic activities;Cognitive remediation/compensation;Patient/family education;Balance training    OT Goals(Current goals can be found in the care plan section) Acute Rehab OT Goals Patient Stated Goal: home OT Goal Formulation: Patient unable to participate in goal setting Time For Goal Achievement: 05/03/20 Potential to Achieve Goals: Good ADL Goals Pt Will Perform Grooming: with supervision;standing Additional ADL Goal #1: Pt will perform OOB ADL x5 mins standing at sink with 1 seated rest break with O2 sats >90%.  OT Frequency: Min 2X/week   Barriers to D/C:            Co-evaluation              AM-PAC OT "6 Clicks" Daily Activity     Outcome Measure Help from another person eating meals?: A Little Help from another person taking care of personal  grooming?: A Little Help from another person toileting, which includes using toliet, bedpan, or urinal?: A Lot Help from another person bathing (including washing, rinsing, drying)?: A Lot Help from another person to put on and taking off regular upper body clothing?: A Little Help from another person to put on and taking off regular lower body clothing?: Total 6 Click Score: 14   End of Session Equipment Utilized During Treatment: Gait belt;Rolling walker Nurse Communication: Mobility status  Activity Tolerance: Patient tolerated treatment well Patient left: in chair;with call bell/phone within reach;with chair alarm set  OT Visit Diagnosis: Unsteadiness on feet (R26.81);Muscle weakness (generalized) (M62.81);Other symptoms and signs involving cognitive function                Time: 1205-1230 OT Time Calculation (min): 25 min Charges:  OT General Charges $OT Visit: 1 Visit OT Evaluation $OT Eval Moderate Complexity: 1 Mod OT Treatments $Self Care/Home Management : 8-22 mins  Flora Lipps, OTR/L Acute Rehabilitation Services Pager: [phone] Office: [phone]    Kathleene Bergemann C 04/19/2020, 2:03 PM

## 2020-04-20 DIAGNOSIS — Z8616 Personal history of COVID-19: Secondary | ICD-10-CM

## 2020-04-20 DIAGNOSIS — F10230 Alcohol dependence with withdrawal, uncomplicated: Secondary | ICD-10-CM

## 2020-04-20 DIAGNOSIS — B9562 Methicillin resistant Staphylococcus aureus infection as the cause of diseases classified elsewhere: Secondary | ICD-10-CM | POA: Diagnosis not present

## 2020-04-20 DIAGNOSIS — R7881 Bacteremia: Secondary | ICD-10-CM

## 2020-04-20 DIAGNOSIS — T827XXA Infection and inflammatory reaction due to other cardiac and vascular devices, implants and grafts, initial encounter: Secondary | ICD-10-CM | POA: Diagnosis not present

## 2020-04-20 DIAGNOSIS — F0390 Unspecified dementia without behavioral disturbance: Secondary | ICD-10-CM | POA: Diagnosis not present

## 2020-04-20 DIAGNOSIS — A419 Sepsis, unspecified organism: Secondary | ICD-10-CM | POA: Diagnosis not present

## 2020-04-20 DIAGNOSIS — U071 COVID-19: Secondary | ICD-10-CM | POA: Diagnosis not present

## 2020-04-20 DIAGNOSIS — L97529 Non-pressure chronic ulcer of other part of left foot with unspecified severity: Secondary | ICD-10-CM | POA: Diagnosis not present

## 2020-04-20 DIAGNOSIS — N3000 Acute cystitis without hematuria: Secondary | ICD-10-CM | POA: Diagnosis not present

## 2020-04-20 LAB — COMPREHENSIVE METABOLIC PANEL
ALT: 31 U/L (ref 0–44)
ALT: 29 U/L (ref 0–44)
ALT: 28 U/L (ref 0–44)
AST: 37 U/L (ref 15–41)
AST: 31 U/L (ref 15–41)
AST: 33 U/L (ref 15–41)
Albumin: 2.6 g/dL — ABNORMAL LOW (ref 3.5–5.0)
Albumin: 2.5 g/dL — ABNORMAL LOW (ref 3.5–5.0)
Albumin: 2.5 g/dL — ABNORMAL LOW (ref 3.5–5.0)
Alkaline Phosphatase: 63 U/L (ref 38–126)
Alkaline Phosphatase: 64 U/L (ref 38–126)
Alkaline Phosphatase: 61 U/L (ref 38–126)
Anion gap: 11 (ref 5–15)
Anion gap: 10 (ref 5–15)
Anion gap: 10 (ref 5–15)
BUN: 20 mg/dL (ref 8–23)
BUN: 19 mg/dL (ref 8–23)
BUN: 19 mg/dL (ref 8–23)
CO2: 26 mmol/L (ref 22–32)
CO2: 27 mmol/L (ref 22–32)
CO2: 24 mmol/L (ref 22–32)
Calcium: 8.8 mg/dL — ABNORMAL LOW (ref 8.9–10.3)
Calcium: 8.6 mg/dL — ABNORMAL LOW (ref 8.9–10.3)
Calcium: 8.5 mg/dL — ABNORMAL LOW (ref 8.9–10.3)
Chloride: 99 mmol/L (ref 98–111)
Chloride: 99 mmol/L (ref 98–111)
Chloride: 100 mmol/L (ref 98–111)
Creatinine, Ser: 0.92 mg/dL (ref 0.61–1.24)
Creatinine, Ser: 1.17 mg/dL (ref 0.61–1.24)
Creatinine, Ser: 1.05 mg/dL (ref 0.61–1.24)
GFR, Estimated: >60 mL/min (ref >60)
GFR, Estimated: >60 mL/min (ref >60)
GFR, Estimated: >60 mL/min (ref >60)
Glucose, Bld: 90 mg/dL (ref 70–99)
Glucose, Bld: 114 mg/dL — ABNORMAL HIGH (ref 70–99)
Glucose, Bld: 116 mg/dL — ABNORMAL HIGH (ref 70–99)
Potassium: 3.6 mmol/L (ref 3.5–5.1)
Potassium: 3.6 mmol/L (ref 3.5–5.1)
Potassium: 3.6 mmol/L (ref 3.5–5.1)
Sodium: 136 mmol/L (ref 135–145)
Sodium: 136 mmol/L (ref 135–145)
Sodium: 134 mmol/L — ABNORMAL LOW (ref 135–145)
Total Bilirubin: 1.1 mg/dL (ref 0.3–1.2)
Total Bilirubin: 0.9 mg/dL (ref 0.3–1.2)
Total Bilirubin: 1.2 mg/dL (ref 0.3–1.2)
Total Protein: 6.3 g/dL — ABNORMAL LOW (ref 6.5–8.1)
Total Protein: 5.8 g/dL — ABNORMAL LOW (ref 6.5–8.1)
Total Protein: 5.8 g/dL — ABNORMAL LOW (ref 6.5–8.1)

## 2020-04-20 LAB — GLUCOSE, CAPILLARY
Glucose-Capillary: 133 mg/dL — ABNORMAL HIGH (ref 70–99)
Glucose-Capillary: 111 mg/dL — ABNORMAL HIGH (ref 70–99)
Glucose-Capillary: 118 mg/dL — ABNORMAL HIGH (ref 70–99)
Glucose-Capillary: 112 mg/dL — ABNORMAL HIGH (ref 70–99)
Glucose-Capillary: 121 mg/dL — ABNORMAL HIGH (ref 70–99)

## 2020-04-20 LAB — CBC WITH DIFFERENTIAL/PLATELET
Abs Immature Granulocytes: 0.08 10*3/uL — ABNORMAL HIGH (ref 0.00–0.07)
Abs Immature Granulocytes: 0.12 10*3/uL — ABNORMAL HIGH (ref 0.00–0.07)
Basophils Absolute: 0 10*3/uL (ref 0.0–0.1)
Basophils Absolute: 0.1 10*3/uL (ref 0.0–0.1)
Basophils Relative: 0 %
Basophils Relative: 0 %
Eosinophils Absolute: 0.2 10*3/uL (ref 0.0–0.5)
Eosinophils Absolute: 0.2 10*3/uL (ref 0.0–0.5)
Eosinophils Relative: 2 %
Eosinophils Relative: 2 %
HCT: 39.9 % (ref 39.0–52.0)
HCT: 38.8 % — ABNORMAL LOW (ref 39.0–52.0)
Hemoglobin: 12.9 g/dL — ABNORMAL LOW (ref 13.0–17.0)
Hemoglobin: 12.7 g/dL — ABNORMAL LOW (ref 13.0–17.0)
Immature Granulocytes: 1 %
Immature Granulocytes: 1 %
Lymphocytes Relative: 16 %
Lymphocytes Relative: 16 %
Lymphs Abs: 1.7 10*3/uL (ref 0.7–4.0)
Lymphs Abs: 1.8 10*3/uL (ref 0.7–4.0)
MCH: 29.2 pg (ref 26.0–34.0)
MCH: 28.8 pg (ref 26.0–34.0)
MCHC: 32.3 g/dL (ref 30.0–36.0)
MCHC: 32.7 g/dL (ref 30.0–36.0)
MCV: 90.3 fL (ref 80.0–100.0)
MCV: 88 fL (ref 80.0–100.0)
Monocytes Absolute: 1.4 10*3/uL — ABNORMAL HIGH (ref 0.1–1.0)
Monocytes Absolute: 1.4 10*3/uL — ABNORMAL HIGH (ref 0.1–1.0)
Monocytes Relative: 12 %
Monocytes Relative: 12 %
Neutro Abs: 7.7 10*3/uL (ref 1.7–7.7)
Neutro Abs: 8.2 10*3/uL — ABNORMAL HIGH (ref 1.7–7.7)
Neutrophils Relative %: 69 %
Neutrophils Relative %: 69 %
Platelets: 249 10*3/uL (ref 150–400)
Platelets: 236 10*3/uL (ref 150–400)
RBC: 4.42 MIL/uL (ref 4.22–5.81)
RBC: 4.41 MIL/uL (ref 4.22–5.81)
RDW: 13.4 % (ref 11.5–15.5)
RDW: 13.2 % (ref 11.5–15.5)
WBC: 11.1 10*3/uL — ABNORMAL HIGH (ref 4.0–10.5)
WBC: 11.8 10*3/uL — ABNORMAL HIGH (ref 4.0–10.5)
nRBC: 0 % (ref 0.0–0.2)
nRBC: 0 % (ref 0.0–0.2)

## 2020-04-20 LAB — VANCOMYCIN, PEAK: Vancomycin Pk: 27 ug/mL — ABNORMAL LOW (ref 30–40)

## 2020-04-20 LAB — CBC
HCT: 38.6 % — ABNORMAL LOW (ref 39.0–52.0)
Hemoglobin: 12.7 g/dL — ABNORMAL LOW (ref 13.0–17.0)
MCH: 29.3 pg (ref 26.0–34.0)
MCHC: 32.9 g/dL (ref 30.0–36.0)
MCV: 88.9 fL (ref 80.0–100.0)
Platelets: 247 10*3/uL (ref 150–400)
RBC: 4.34 MIL/uL (ref 4.22–5.81)
RDW: 13.2 % (ref 11.5–15.5)
WBC: 11.3 10*3/uL — ABNORMAL HIGH (ref 4.0–10.5)
nRBC: 0 % (ref 0.0–0.2)

## 2020-04-20 LAB — URINE CULTURE: Culture: >=100000 — AB

## 2020-04-20 LAB — MAGNESIUM: Magnesium: 1.6 mg/dL — ABNORMAL LOW (ref 1.7–2.4)

## 2020-04-20 LAB — PHOSPHORUS: Phosphorus: 4.1 mg/dL (ref 2.5–4.6)

## 2020-04-20 MED ORDER — THIAMINE HCL 100 MG PO TABS
100.0000 mg | ORAL_TABLET | Freq: Every day | ORAL | Status: DC
  Administered 2020-04-20 – 2020-04-23 (×4): 100 mg via ORAL
  Filled 2020-04-20 (×5): qty 1

## 2020-04-20 MED ORDER — LORAZEPAM 1 MG PO TABS
1.0000 mg | ORAL_TABLET | ORAL | Status: AC | PRN
Start: 2020-04-20 — End: 2020-04-23

## 2020-04-20 MED ORDER — LORAZEPAM 1 MG PO TABS: 0.0000 mg | ORAL_TABLET | Freq: Three times a day (TID) | ORAL | Status: DC

## 2020-04-20 MED ORDER — ADULT MULTIVITAMIN W/MINERALS CH
1.0000 | ORAL_TABLET | Freq: Every day | ORAL | Status: DC
  Administered 2020-04-20 – 2020-04-24 (×4): 1 via ORAL
  Filled 2020-04-20 (×5): qty 1

## 2020-04-20 MED ORDER — LORAZEPAM 2 MG/ML IJ SOLN: 1.0000 mg | INTRAMUSCULAR | Status: AC | PRN

## 2020-04-20 MED ORDER — THIAMINE HCL 100 MG/ML IJ SOLN
100.0000 mg | Freq: Every day | INTRAMUSCULAR | Status: DC
  Administered 2020-04-24: 100 mg via INTRAVENOUS
  Filled 2020-04-20: qty 2

## 2020-04-20 MED ORDER — LORAZEPAM 1 MG PO TABS: 0.0000 mg | ORAL_TABLET | ORAL | Status: AC

## 2020-04-20 MED ORDER — FOLIC ACID 1 MG PO TABS
1.0000 mg | ORAL_TABLET | Freq: Every day | ORAL | Status: DC
  Administered 2020-04-20 – 2020-04-24 (×5): 1 mg via ORAL
  Filled 2020-04-20 (×5): qty 1

## 2020-04-20 NOTE — Progress Notes (Signed)
PROGRESS NOTE    Adam Conner  JXB:[id number] DOB: [birth year] DOA: 04/16/2020 PCP: Patient, No Pcp Per   Brief Narrative:  87 year old male with past medical history of ongoing alcohol abuse, hyperlipidemia, dementia, hypertension, bioprosthetic hyperplasia, pacemaker placement who presents to [facility] emergency department via EMS after they were contacted by family due to progressive confusion. Patient is an extremely poor historian due to advanced dementia. Unfortunately, there are no outpatient records available and no one is answering our only contact number on file and therefore the majority of the history is been obtained from EMS and emergency department staff. In the ED patient found to be febrile with leukocytosis and tachycardia concerning for sepsis. Urinalysis concerning for UTI, antibiotics initiated at intake and patient admitted for further evaluation treatment as below. Patient incidentally found to be Covid positive, likely not related to his current sepsis findings.  Assessment & Plan:   Principal Problem:   Sepsis (HCC) Active Problems:   Lactic acidosis   Essential hypertension   Benign prostatic hyperplasia   Dementia without behavioral disturbance (HCC)   Presence of cardiac pacemaker   COVID-19 virus infection   Hyperglycemia   Acute cystitis without hematuria   Sepsis secondary to UTI (HCC)  Sepsis secondary to UTI, POA  Concurrent MRSA bacteremia - Patient febrile, leukocytosis, with tachypnea meeting sepsis criteria - Concurrently elevated lactic acidosis, received IV fluids per sepsis protocol. - Urinalysis indicative of UTI concurrent with patient's altered mental status, cultures pending - Discontinue IV ceftriaxone, start Vancomycin given prelim BCID results - De-escalate based on sensitivities and findings on urine and blood cultures - Continue to advance diet, patient more awake alert oriented today than previous, holding further IV fluids,  patient appears to be more euvolemic at this point, SIRS criteria resolving  Acute metabolic encephalopathy on dementia without behavioral disturbance (HCC), POA Alcohol use/abue with questionable withdrawals - Alcohol use/abuse - ongoing drinker per daughter - will start CIWA protocol - daughter estimates 2-3 beers daily for decades - likely more on occasion - Per family baseline is alert oriented -admitted patient was essentially ANO x 0 - waxing and waning but generally improving mental status over the past 48 hours - Not yet back to baseline per discussion with daughter - Continue delirium precautions, high risk for sundowning given age and comorbid's above  Incidental COVID-19 positive, unclear if acute infection - Chest x-ray shows scant bilateral infiltrate, largely unchanged from previous imaging last week -Completed 3-day course of Remdesivir, hold off on steroids/immunomodulators in the setting of above bacterial infection - Continue precautions for 10 days per protocol -questionably positive last month per family outpatient test, no available documentation. - Continues without hypoxia shortness of breath cough or wheeze.  - Continue supportive care but currently no indication for incentive spirometry, flutter, proning or respiratory support  Benign prostatic hyperplasia - Continue Flomax - Postvoid residual bladder scan performed in the emergency department reveals no evidence of urinary retention  Presence of cardiac pacemaker - Unclear indication, poor records, no family available, patient remains altered as above   Diabetes mellitus type 2 without complication, questionably newly diagnosed - Distant records mention prediabetes, A1c 6.6 at intake  - Continue sliding scale insulin, hypoglycemic protocol  Code Status:  Full code Family Communication:  None available Status is: Inpatient  Dispo: The patient is from: Home              Anticipated d/c is to: To be  determined  Anticipated d/c date is: 24 to 48 hours              Patient currently not medically stable for discharge given ongoing waxing and waning mental status, need for ongoing IV antibiotics  Consultants:   None  Procedures:   None  Antimicrobials:  Ceftriaxone - discontinued 2/25 vancomycin (2/25--present) Remdesivir (2/25--2/27)  Subjective: No acute issues or events overnight, patient mental status somewhat more depressed than yesterday but certainly improved from admission.  Review of systems somewhat limited but patient denies any overt chest pain shortness of breath nausea vomiting or diarrhea.  Objective: Vitals:   04/19/20 0440 04/19/20 1046 04/19/20 2032 04/20/20 0436  BP: (!) 156/87 (!) 158/83 (!) 142/77 (!) 157/70  Pulse: 62 60 61 62  Resp: 20 20 17 18   Temp: 97.9 F (36.6 C) 97.9 F (36.6 C) 97.7 F (36.5 C) 97.8 F (36.6 C)  TempSrc: Oral  Oral Oral  SpO2: 100% 100% 100% 100%  Weight:      Height:        Intake/Output Summary (Last 24 hours) at 04/20/2020 0739 Last data filed at 04/20/2020 0500 Gross per 24 hour  Intake 480 ml  Output -  Net 480 ml   Filed Weights   04/16/20 2056 04/17/20 0422  Weight: 110 kg 108.1 kg    Examination:  General:  Pleasantly resting in bed, No acute distress. HEENT:  Normocephalic atraumatic.  Sclerae nonicteric, noninjected.  Extraocular movements intact bilaterally. Neck:  Without mass or deformity.  Trachea is midline. Lungs:  Clear to auscultate bilaterally without rhonchi, wheeze, or rales. Heart:  Regular rate and rhythm.  Without murmurs, rubs, or gallops. Abdomen:  Soft, nontender, nondistended.  Without guarding or rebound. Extremities: Without cyanosis, clubbing, edema, or obvious deformity. Vascular:  Dorsalis pedis and posterior tibial pulses palpable bilaterally. Skin:  Warm and dry, no erythema, no ulcerations.   Data Reviewed: I have personally reviewed following labs and imaging  studies  CBC: Recent Labs  Lab 04/16/20 2129 04/18/20 0059 04/19/20 0321  WBC 22.9* 20.8* 14.1*  NEUTROABS 18.3* 16.0* 10.3*  HGB 13.6 12.6* 13.0  HCT 42.7 37.2* 40.0  MCV 90.7 87.7 89.3  PLT 266 220 235   Basic Metabolic Panel: Recent Labs  Lab 04/16/20 2129 04/18/20 0059 04/19/20 0321  NA 132* 136 135  K 3.6 2.9* 3.2*  CL 100 101 100  CO2 23 24 25   GLUCOSE 169* 114* 122*  BUN 26* 23 25*  CREATININE 1.40* 1.17 0.97  CALCIUM 8.8* 8.5* 8.8*  MG  --  1.7  --    GFR: Estimated Creatinine Clearance: 69.8 mL/min (by C-G formula based on SCr of 0.97 mg/dL). Liver Function Tests: Recent Labs  Lab 04/16/20 2129 04/18/20 0059 04/19/20 0321  AST 25 22 37  ALT 16 17 27   ALKPHOS 73 73 66  BILITOT 1.7* 1.7* 1.2  PROT 6.6 6.1* 6.3*  ALBUMIN 3.1* 2.6* 2.6*   No results for input(s): LIPASE, AMYLASE in the last 168 hours. No results for input(s): AMMONIA in the last 168 hours. Coagulation Profile: Recent Labs  Lab 04/16/20 2129  INR 1.5*   Cardiac Enzymes: No results for input(s): CKTOTAL, CKMB, CKMBINDEX, TROPONINI in the last 168 hours. BNP (last 3 results) No results for input(s): PROBNP in the last 8760 hours. HbA1C: No results for input(s): HGBA1C in the last 72 hours. CBG: Recent Labs  Lab 04/19/20 0720 04/19/20 1148 04/19/20 1555 04/19/20 2030 04/20/20 0724  GLUCAP 109* 126* 124*  139* 133*   Lipid Profile: No results for input(s): CHOL, HDL, LDLCALC, TRIG, CHOLHDL, LDLDIRECT in the last 72 hours. Thyroid Function Tests: No results for input(s): TSH, T4TOTAL, FREET4, T3FREE, THYROIDAB in the last 72 hours. Anemia Panel: No results for input(s): VITAMINB12, FOLATE, FERRITIN, TIBC, IRON, RETICCTPCT in the last 72 hours. Sepsis Labs: Recent Labs  Lab 04/16/20 2129 04/16/20 2317  PROCALCITON 0.31  --   LATICACIDVEN 2.7* 1.8    Recent Results (from the past 240 hour(s))  Blood culture (routine single)     Status: Abnormal   Collection Time:  04/16/20  9:29 PM   Specimen: BLOOD  Result Value Ref Range Status   Specimen Description BLOOD LEFT ANTECUBITAL  Final   Special Requests   Final    BOTTLES DRAWN AEROBIC AND ANAEROBIC Blood Culture adequate volume   Culture  Setup Time   Final    GRAM POSITIVE COCCI IN CLUSTERS IN BOTH AEROBIC AND ANAEROBIC BOTTLES CRITICAL RESULT CALLED TO, READ BACK BY AND VERIFIED WITHArnaldo Natal PHARMD [id number] 04/17/20 A BROWNING Performed at [facility], [address] [place], [place] [address]    Culture METHICILLIN RESISTANT STAPHYLOCOCCUS AUREUS (A)  Final   Report Status 04/19/2020 FINAL  Final   Organism ID, Bacteria METHICILLIN RESISTANT STAPHYLOCOCCUS AUREUS  Final      Susceptibility   Methicillin resistant staphylococcus aureus - MIC*    CIPROFLOXACIN >=8 RESISTANT Resistant     ERYTHROMYCIN >=8 RESISTANT Resistant     GENTAMICIN <=0.5 SENSITIVE Sensitive     OXACILLIN >=4 RESISTANT Resistant     TETRACYCLINE <=1 SENSITIVE Sensitive     VANCOMYCIN 1 SENSITIVE Sensitive     TRIMETH/SULFA <=10 SENSITIVE Sensitive     CLINDAMYCIN >=8 RESISTANT Resistant     RIFAMPIN <=0.5 SENSITIVE Sensitive     Inducible Clindamycin NEGATIVE Sensitive     * METHICILLIN RESISTANT STAPHYLOCOCCUS AUREUS  Blood Culture ID Panel (Reflexed)     Status: Abnormal   Collection Time: 04/16/20  9:29 PM  Result Value Ref Range Status   Enterococcus faecalis NOT DETECTED NOT DETECTED Final   Enterococcus Faecium NOT DETECTED NOT DETECTED Final   Listeria monocytogenes NOT DETECTED NOT DETECTED Final   Staphylococcus species DETECTED (A) NOT DETECTED Final    Comment: CRITICAL RESULT CALLED TO, READ BACK BY AND VERIFIED WITHArnaldo Natal PHARMD 1645 04/17/20 A BROWNING    Staphylococcus aureus (BCID) DETECTED (A) NOT DETECTED Final    Comment: Methicillin (oxacillin)-resistant Staphylococcus aureus (MRSA). MRSA is predictably resistant to beta-lactam antibiotics (except ceftaroline). Preferred therapy is  vancomycin unless clinically contraindicated. Patient requires contact precautions if  hospitalized. CRITICAL RESULT CALLED TO, READ BACK BY AND VERIFIED WITH: Arnaldo Natal PHARMD [id number] 04/17/20 A BROWNING    Staphylococcus epidermidis NOT DETECTED NOT DETECTED Final   Staphylococcus lugdunensis NOT DETECTED NOT DETECTED Final   Streptococcus species NOT DETECTED NOT DETECTED Final   Streptococcus agalactiae NOT DETECTED NOT DETECTED Final   Streptococcus pneumoniae NOT DETECTED NOT DETECTED Final   Streptococcus pyogenes NOT DETECTED NOT DETECTED Final   A.calcoaceticus-baumannii NOT DETECTED NOT DETECTED Final   Bacteroides fragilis NOT DETECTED NOT DETECTED Final   Enterobacterales NOT DETECTED NOT DETECTED Final   Enterobacter cloacae complex NOT DETECTED NOT DETECTED Final   Escherichia coli NOT DETECTED NOT DETECTED Final   Klebsiella aerogenes NOT DETECTED NOT DETECTED Final   Klebsiella oxytoca NOT DETECTED NOT DETECTED Final   Klebsiella pneumoniae NOT DETECTED NOT DETECTED Final  Proteus species NOT DETECTED NOT DETECTED Final   Salmonella species NOT DETECTED NOT DETECTED Final   Serratia marcescens NOT DETECTED NOT DETECTED Final   Haemophilus influenzae NOT DETECTED NOT DETECTED Final   Neisseria meningitidis NOT DETECTED NOT DETECTED Final   Pseudomonas aeruginosa NOT DETECTED NOT DETECTED Final   Stenotrophomonas maltophilia NOT DETECTED NOT DETECTED Final   Candida albicans NOT DETECTED NOT DETECTED Final   Candida auris NOT DETECTED NOT DETECTED Final   Candida glabrata NOT DETECTED NOT DETECTED Final   Candida krusei NOT DETECTED NOT DETECTED Final   Candida parapsilosis NOT DETECTED NOT DETECTED Final   Candida tropicalis NOT DETECTED NOT DETECTED Final   Cryptococcus neoformans/gattii NOT DETECTED NOT DETECTED Final   Meth resistant mecA/C and MREJ DETECTED (A) NOT DETECTED Final    Comment: CRITICAL RESULT CALLED TO, READ BACK BY AND VERIFIED WITHArnaldo Natal PHARMD  1645 04/17/20 A BROWNING Performed at [facility], [address] [place], [place] [address]   Urine culture     Status: Abnormal (Preliminary result)   Collection Time: 04/16/20 10:30 PM   Specimen: In/Out Cath Urine  Result Value Ref Range Status   Specimen Description IN/OUT CATH URINE  Final   Special Requests NONE  Final   Culture (A)  Final    >=100,000 COLONIES/mL PROTEUS MIRABILIS >=100,000 COLONIES/mL ESCHERICHIA COLI SUSCEPTIBILITIES TO FOLLOW Performed at [facility], [address], [place], [place] [address]    Report Status PENDING  Incomplete   Organism ID, Bacteria PROTEUS MIRABILIS (A)  Final      Susceptibility   Proteus mirabilis - MIC*    AMPICILLIN <=2 SENSITIVE Sensitive     CEFAZOLIN <=4 SENSITIVE Sensitive     CEFEPIME <=0.12 SENSITIVE Sensitive     CEFTRIAXONE <=0.25 SENSITIVE Sensitive     CIPROFLOXACIN <=0.25 SENSITIVE Sensitive     GENTAMICIN <=1 SENSITIVE Sensitive     IMIPENEM 2 SENSITIVE Sensitive     NITROFURANTOIN 128 RESISTANT Resistant     TRIMETH/SULFA <=20 SENSITIVE Sensitive     AMPICILLIN/SULBACTAM <=2 SENSITIVE Sensitive     PIP/TAZO <=4 SENSITIVE Sensitive     * >=100,000 COLONIES/mL PROTEUS MIRABILIS  Resp Panel by RT-PCR (Flu A&B, Covid) Nasopharyngeal Swab     Status: Abnormal   Collection Time: 04/16/20 10:35 PM   Specimen: Nasopharyngeal Swab; Nasopharyngeal(NP) swabs in vial transport medium  Result Value Ref Range Status   SARS Coronavirus 2 by RT PCR POSITIVE (A) NEGATIVE Final    Comment: RESULT CALLED TO, READ BACK BY AND VERIFIED WITH: B SANGALANG RN 04/17/20  0022 JDW (NOTE) SARS-CoV-2 target nucleic acids are DETECTED.  The SARS-CoV-2 RNA is generally detectable in upper respiratory specimens during the acute phase of infection. Positive results are indicative of the presence of the identified virus, but do not rule out bacterial infection or co-infection with other pathogens not detected by the test.  Clinical correlation with patient history and other diagnostic information is necessary to determine patient infection status. The expected result is Negative.  Fact Sheet for Patients: BloggerCourse.com  Fact Sheet for Healthcare Providers: SeriousBroker.it  This test is not yet approved or cleared by the [place] FDA and  has been authorized for detection and/or diagnosis of SARS-CoV-2 by FDA under an Emergency Use Authorization (EUA).  This EUA will remain in effect (meaning this test can be  used) for the duration of  the COVID-19 declaration under Section 564(b)(1) of the Act, 21 U.S.C. section  360bbb-3(b)(1), unless the authorization is terminated or revoked sooner.     Influenza A by PCR NEGATIVE NEGATIVE Final   Influenza B by PCR NEGATIVE NEGATIVE Final    Comment: (NOTE) The Xpert Xpress SARS-CoV-2/FLU/RSV plus assay is intended as an aid in the diagnosis of influenza from Nasopharyngeal swab specimens and should not be used as a sole basis for treatment. Nasal washings and aspirates are unacceptable for Xpert Xpress SARS-CoV-2/FLU/RSV testing.  Fact Sheet for Patients: BloggerCourse.com  Fact Sheet for Healthcare Providers: SeriousBroker.it  This test is not yet approved or cleared by the [place] FDA and has been authorized for detection and/or diagnosis of SARS-CoV-2 by FDA under an Emergency Use Authorization (EUA). This EUA will remain in effect (meaning this test can be used) for the duration of the COVID-19 declaration under Section 564(b)(1) of the Act, 21 U.S.C. section 360bbb-3(b)(1), unless the authorization is terminated or revoked.  Performed at [facility], [address] [place], [place] [address]          Radiology Studies: DG Foot 2 Views Left  Result Date: 04/18/2020 CLINICAL DATA:  Diabetic foot ulcer. EXAM: LEFT FOOT -  2 VIEW COMPARISON:  None. FINDINGS: There is no evidence of fracture or dislocation. There is no evidence of arthropathy or other focal bone abnormality. Soft tissues are unremarkable. IMPRESSION: Negative. Electronically Signed   By: Lupita Raider M.D.   On: 04/18/2020 15:09        Scheduled Meds: . vitamin C  500 mg Oral Daily  . enoxaparin (LOVENOX) injection  40 mg Subcutaneous QHS  . insulin aspart  0-9 Units Subcutaneous TID AC & HS  . tamsulosin  0.4 mg Oral QPC breakfast  . zinc sulfate  220 mg Oral Daily   Continuous Infusions: . vancomycin 1,500 mg (04/19/20 1744)     LOS: 3 days    Time spent: 40  Azucena Fallen, DO [facility]  If 7PM-7AM, please contact night-coverage [email or web address]  04/20/2020, 7:39 AM

## 2020-04-20 NOTE — Progress Notes (Signed)
[facility]  Date of Admission:  04/16/2020      Total days of antibiotics 3   Vancomycin            ASSESSMENT: Adam Conner is a 87 y.o. male admitted via EMS after increased confusion at home. He was found to have high fevers - blood cultures growing out MRSA. Complicated by pacemaker in situ. Repeat blood cultures are pending at this time to ensure clearance. With cardiac device in place will need to get EP to see him for consideration of extraction - unclear as to what the indication is for his device and he is unsure.   Seems like his COVID pcr test is likely a persistent positive from documented (+) home test in January 19 - will call micro to get CT value; hopefully this will allow him to come out of isolation and proceed with TEE to evaluate patient's wire and native valves. Will need to review PTA meds to consider rifampin safety.     PLAN: 1. Continue vancomycin  2. Follow creatinine on #1  3. Consider rifampin once clears bacteremia and review home meds  4. Will need EP to see him given device - needs a TEE.  5. Will call micro to get CT value on covid pcr   ADDENDUM:  CT value 35 - will review with IP but I think he can come out of isolation. Continue for now until we are sure.     Principal Problem:   Sepsis (HCC) Active Problems:   MRSA bacteremia   Lactic acidosis   Essential hypertension   Benign prostatic hyperplasia   Dementia without behavioral disturbance (HCC)   Presence of cardiac pacemaker   COVID-19 virus infection   Hyperglycemia   Acute cystitis without hematuria   Sepsis secondary to UTI (HCC)   . vitamin C  500 mg Oral Daily  . enoxaparin (LOVENOX) injection  40 mg Subcutaneous QHS  . folic acid  1 mg Oral Daily  . insulin aspart  0-9 Units Subcutaneous TID AC & HS  . LORazepam  0-4 mg Oral Q4H   Followed by  . [START ON 04/22/2020] LORazepam  0-4 mg Oral Q8H  . multivitamin with minerals  1 tablet  Oral Daily  . tamsulosin  0.4 mg Oral QPC breakfast  . thiamine  100 mg Oral Daily   Or  . thiamine  100 mg Intravenous Daily  . zinc sulfate  220 mg Oral Daily    SUBJECTIVE: Feels OK - does not contribute much. States he is not having any pain anywhere.  Cannot recall any details about pacemaker. Unclear about history of foot ulcer.    Review of Systems: Review of Systems  Constitutional: Negative for chills and fever.  HENT: Negative for tinnitus.   Eyes: Negative for blurred vision and photophobia.  Respiratory: Negative for cough and sputum production.   Cardiovascular: Negative for chest pain.  Gastrointestinal: Negative for diarrhea, nausea and vomiting.  Genitourinary: Negative for dysuria.  Skin: Negative for rash.  Neurological: Negative for headaches.    No Known Allergies  OBJECTIVE: Vitals:   04/19/20 2032 04/20/20 0436 04/20/20 0850 04/20/20 1449  BP: (!) 142/77 (!) 157/70 137/66 (!) 146/72  Pulse: 61 62 60 60  Resp: 17 18 20 20   Temp: 97.7 F (36.5 C) 97.8 F (36.6 C) 97.6 F (36.4 C) 97.8 F (36.6 C)  TempSrc: Oral Oral  Oral  SpO2:  100% 100% 100% 96%  Weight:      Height:       Body mass index is 34.2 kg/m.  Physical Exam Vitals reviewed.  Constitutional:      General: He is not in acute distress.    Appearance: Normal appearance. He is ill-appearing.  HENT:     Mouth/Throat:     Mouth: Mucous membranes are moist.     Pharynx: Oropharynx is clear.  Eyes:     General: No scleral icterus.    Pupils: Pupils are equal, round, and reactive to light.  Cardiovascular:     Rate and Rhythm: Normal rate and regular rhythm.     Heart sounds: No murmur heard.   Pulmonary:     Effort: Pulmonary effort is normal.     Breath sounds: Normal breath sounds. No stridor.  Chest:     Comments: PPM generator site unremarkable  Abdominal:     General: Bowel sounds are normal.     Palpations: Abdomen is soft.  Musculoskeletal:        General: Normal  range of motion.     Comments: L great toe with plantar ulceration. Small central opening does not appear to probe to bone. No erythema. Callous formed around it. No drainage.   Skin:    General: Skin is warm and dry.     Capillary Refill: Capillary refill takes less than 2 seconds.     Findings: No rash.  Neurological:     Mental Status: He is alert and oriented to person, place, and time.  Psychiatric:        Mood and Affect: Mood normal.        Behavior: Behavior normal.     Lab Results Lab Results  Component Value Date   WBC 11.1 (H) 04/20/2020   HGB 12.9 (L) 04/20/2020   HCT 39.9 04/20/2020   MCV 90.3 04/20/2020   PLT 249 04/20/2020    Lab Results  Component Value Date   CREATININE 0.92 04/20/2020   BUN 20 04/20/2020   NA 136 04/20/2020   K 3.6 04/20/2020   CL 99 04/20/2020   CO2 26 04/20/2020    Lab Results  Component Value Date   ALT 31 04/20/2020   AST 37 04/20/2020   ALKPHOS 63 04/20/2020   BILITOT 1.1 04/20/2020     Microbiology: Recent Results (from the past 240 hour(s))  Blood culture (routine single)     Status: Abnormal   Collection Time: 04/16/20  9:29 PM   Specimen: BLOOD  Result Value Ref Range Status   Specimen Description BLOOD LEFT ANTECUBITAL  Final   Special Requests   Final    BOTTLES DRAWN AEROBIC AND ANAEROBIC Blood Culture adequate volume   Culture  Setup Time   Final    GRAM POSITIVE COCCI IN CLUSTERS IN BOTH AEROBIC AND ANAEROBIC BOTTLES CRITICAL RESULT CALLED TO, READ BACK BY AND VERIFIED WITHArnaldo Natal: J CARNEY PHARMD [id number] 04/17/20 A BROWNING Performed at [facility], [address] [place], [place] [address]    Culture METHICILLIN RESISTANT STAPHYLOCOCCUS AUREUS (A)  Final   Report Status 04/19/2020 FINAL  Final   Organism ID, Bacteria METHICILLIN RESISTANT STAPHYLOCOCCUS AUREUS  Final      Susceptibility   Methicillin resistant staphylococcus aureus - MIC*    CIPROFLOXACIN >=8 RESISTANT Resistant     ERYTHROMYCIN >=8 RESISTANT  Resistant     GENTAMICIN <=0.5 SENSITIVE Sensitive     OXACILLIN >=4 RESISTANT Resistant  TETRACYCLINE <=1 SENSITIVE Sensitive     VANCOMYCIN 1 SENSITIVE Sensitive     TRIMETH/SULFA <=10 SENSITIVE Sensitive     CLINDAMYCIN >=8 RESISTANT Resistant     RIFAMPIN <=0.5 SENSITIVE Sensitive     Inducible Clindamycin NEGATIVE Sensitive     * METHICILLIN RESISTANT STAPHYLOCOCCUS AUREUS  Blood Culture ID Panel (Reflexed)     Status: Abnormal   Collection Time: 04/16/20  9:29 PM  Result Value Ref Range Status   Enterococcus faecalis NOT DETECTED NOT DETECTED Final   Enterococcus Faecium NOT DETECTED NOT DETECTED Final   Listeria monocytogenes NOT DETECTED NOT DETECTED Final   Staphylococcus species DETECTED (A) NOT DETECTED Final    Comment: CRITICAL RESULT CALLED TO, READ BACK BY AND VERIFIED WITHArnaldo Natal PHARMD 1645 04/17/20 A BROWNING    Staphylococcus aureus (BCID) DETECTED (A) NOT DETECTED Final    Comment: Methicillin (oxacillin)-resistant Staphylococcus aureus (MRSA). MRSA is predictably resistant to beta-lactam antibiotics (except ceftaroline). Preferred therapy is vancomycin unless clinically contraindicated. Patient requires contact precautions if  hospitalized. CRITICAL RESULT CALLED TO, READ BACK BY AND VERIFIED WITH: Arnaldo Natal PHARMD [id number] 04/17/20 A BROWNING    Staphylococcus epidermidis NOT DETECTED NOT DETECTED Final   Staphylococcus lugdunensis NOT DETECTED NOT DETECTED Final   Streptococcus species NOT DETECTED NOT DETECTED Final   Streptococcus agalactiae NOT DETECTED NOT DETECTED Final   Streptococcus pneumoniae NOT DETECTED NOT DETECTED Final   Streptococcus pyogenes NOT DETECTED NOT DETECTED Final   A.calcoaceticus-baumannii NOT DETECTED NOT DETECTED Final   Bacteroides fragilis NOT DETECTED NOT DETECTED Final   Enterobacterales NOT DETECTED NOT DETECTED Final   Enterobacter cloacae complex NOT DETECTED NOT DETECTED Final   Escherichia coli NOT DETECTED NOT DETECTED  Final   Klebsiella aerogenes NOT DETECTED NOT DETECTED Final   Klebsiella oxytoca NOT DETECTED NOT DETECTED Final   Klebsiella pneumoniae NOT DETECTED NOT DETECTED Final   Proteus species NOT DETECTED NOT DETECTED Final   Salmonella species NOT DETECTED NOT DETECTED Final   Serratia marcescens NOT DETECTED NOT DETECTED Final   Haemophilus influenzae NOT DETECTED NOT DETECTED Final   Neisseria meningitidis NOT DETECTED NOT DETECTED Final   Pseudomonas aeruginosa NOT DETECTED NOT DETECTED Final   Stenotrophomonas maltophilia NOT DETECTED NOT DETECTED Final   Candida albicans NOT DETECTED NOT DETECTED Final   Candida auris NOT DETECTED NOT DETECTED Final   Candida glabrata NOT DETECTED NOT DETECTED Final   Candida krusei NOT DETECTED NOT DETECTED Final   Candida parapsilosis NOT DETECTED NOT DETECTED Final   Candida tropicalis NOT DETECTED NOT DETECTED Final   Cryptococcus neoformans/gattii NOT DETECTED NOT DETECTED Final   Meth resistant mecA/C and MREJ DETECTED (A) NOT DETECTED Final    Comment: CRITICAL RESULT CALLED TO, READ BACK BY AND VERIFIED WITHArnaldo Natal PHARMD 1645 04/17/20 A BROWNING Performed at [facility], [address], [place], [place] [address]   Urine culture     Status: Abnormal   Collection Time: 04/16/20 10:30 PM   Specimen: In/Out Cath Urine  Result Value Ref Range Status   Specimen Description IN/OUT CATH URINE  Final   Special Requests   Final    NONE Performed at [facility], [address] [place], [place] [address]    Culture (A)  Final    >=100,000 COLONIES/mL PROTEUS MIRABILIS >=100,000 COLONIES/mL ESCHERICHIA COLI    Report Status 04/20/2020 FINAL  Final   Organism ID, Bacteria PROTEUS MIRABILIS (A)  Final   Organism ID, Bacteria ESCHERICHIA  COLI (A)  Final      Susceptibility   Escherichia coli - MIC*    AMPICILLIN >=32 RESISTANT Resistant     CEFAZOLIN 16 SENSITIVE Sensitive     CEFEPIME <=0.12 SENSITIVE Sensitive      CEFTRIAXONE <=0.25 SENSITIVE Sensitive     CIPROFLOXACIN 1 SENSITIVE Sensitive     GENTAMICIN <=1 SENSITIVE Sensitive     IMIPENEM <=0.25 SENSITIVE Sensitive     NITROFURANTOIN <=16 SENSITIVE Sensitive     TRIMETH/SULFA >=320 RESISTANT Resistant     AMPICILLIN/SULBACTAM >=32 RESISTANT Resistant     PIP/TAZO 8 SENSITIVE Sensitive     * >=100,000 COLONIES/mL ESCHERICHIA COLI   Proteus mirabilis - MIC*    AMPICILLIN <=2 SENSITIVE Sensitive     CEFAZOLIN <=4 SENSITIVE Sensitive     CEFEPIME <=0.12 SENSITIVE Sensitive     CEFTRIAXONE <=0.25 SENSITIVE Sensitive     CIPROFLOXACIN <=0.25 SENSITIVE Sensitive     GENTAMICIN <=1 SENSITIVE Sensitive     IMIPENEM 2 SENSITIVE Sensitive     NITROFURANTOIN 128 RESISTANT Resistant     TRIMETH/SULFA <=20 SENSITIVE Sensitive     AMPICILLIN/SULBACTAM <=2 SENSITIVE Sensitive     PIP/TAZO <=4 SENSITIVE Sensitive     * >=100,000 COLONIES/mL PROTEUS MIRABILIS  Resp Panel by RT-PCR (Flu A&B, Covid) Nasopharyngeal Swab     Status: Abnormal   Collection Time: 04/16/20 10:35 PM   Specimen: Nasopharyngeal Swab; Nasopharyngeal(NP) swabs in vial transport medium  Result Value Ref Range Status   SARS Coronavirus 2 by RT PCR POSITIVE (A) NEGATIVE Final    Comment: RESULT CALLED TO, READ BACK BY AND VERIFIED WITH: B SANGALANG RN 04/17/20  0022 JDW (NOTE) SARS-CoV-2 target nucleic acids are DETECTED.  The SARS-CoV-2 RNA is generally detectable in upper respiratory specimens during the acute phase of infection. Positive results are indicative of the presence of the identified virus, but do not rule out bacterial infection or co-infection with other pathogens not detected by the test. Clinical correlation with patient history and other diagnostic information is necessary to determine patient infection status. The expected result is Negative.  Fact Sheet for Patients: BloggerCourse.com  Fact Sheet for Healthcare  Providers: SeriousBroker.it  This test is not yet approved or cleared by the [place] FDA and  has been authorized for detection and/or diagnosis of SARS-CoV-2 by FDA under an Emergency Use Authorization (EUA).  This EUA will remain in effect (meaning this test can be  used) for the duration of  the COVID-19 declaration under Section 564(b)(1) of the Act, 21 U.S.C. section 360bbb-3(b)(1), unless the authorization is terminated or revoked sooner.     Influenza A by PCR NEGATIVE NEGATIVE Final   Influenza B by PCR NEGATIVE NEGATIVE Final    Comment: (NOTE) The Xpert Xpress SARS-CoV-2/FLU/RSV plus assay is intended as an aid in the diagnosis of influenza from Nasopharyngeal swab specimens and should not be used as a sole basis for treatment. Nasal washings and aspirates are unacceptable for Xpert Xpress SARS-CoV-2/FLU/RSV testing.  Fact Sheet for Patients: BloggerCourse.com  Fact Sheet for Healthcare Providers: SeriousBroker.it  This test is not yet approved or cleared by the [place] FDA and has been authorized for detection and/or diagnosis of SARS-CoV-2 by FDA under an Emergency Use Authorization (EUA). This EUA will remain in effect (meaning this test can be used) for the duration of the COVID-19 declaration under Section 564(b)(1) of the Act, 21 U.S.C. section 360bbb-3(b)(1), unless the authorization is terminated or revoked.  Performed at [facility], [address], [place], [place] [address]   Culture, blood (Routine X 2) w Reflex to ID Panel     Status: None (Preliminary result)   Collection Time: 04/19/20  3:26 AM   Specimen: BLOOD  Result Value Ref Range Status   Specimen Description BLOOD RIGHT ANTECUBITAL  Final   Special Requests   Final    BOTTLES DRAWN AEROBIC AND ANAEROBIC Blood Culture adequate volume   Culture   Final    NO GROWTH 1 DAY Performed at [facility], [address] [place], [place] [address]    Report Status PENDING  Incomplete  Culture, blood (Routine X 2) w Reflex to ID Panel     Status: None (Preliminary result)   Collection Time: 04/19/20  3:31 AM   Specimen: BLOOD LEFT HAND  Result Value Ref Range Status   Specimen Description BLOOD LEFT HAND  Final   Special Requests   Final    BOTTLES DRAWN AEROBIC AND ANAEROBIC Blood Culture adequate volume   Culture   Final    NO GROWTH 1 DAY Performed at [facility], [address] [place], [place] [address]    Report Status PENDING  Incomplete     Rexene Alberts, MSN, NP-C [facility]  [email or web address] Pager: [phone] Office: [phone] [facility] Main Line: [phone]

## 2020-04-20 NOTE — TOC Initial Note (Signed)
Transition of Care [facility]) - Initial/Assessment Note    Patient Details  Name: Adam Conner MRN: [id number] Date of Birth: Sep 10, 1933  [facility] CM/SW Contact:    Kermit Balo, RN Phone Number: 04/20/2020, 1:35 PM  Clinical Narrative:                 Pt only oriented to self. CM called pts daughter and inquired about HH post hospitalization and 24 hour supervision recommendations. Per daughter pts nephew stays with him about 10 days out of the month. He is alone the other days. She is interested in SNF rehab at the [facility] as other family lives in that area.  PCP is Dr Reita May through [facility] Pt uses [facility] access for transportation or [place].  CM has updated CSW of need for SNF. TOC following.  Expected Discharge Plan: Skilled Nursing Facility Barriers to Discharge: Continued Medical Work up   Patient Goals and CMS Choice     Choice offered to / list presented to : Patient,Adult Children  Expected Discharge Plan and Services Expected Discharge Plan: Skilled Nursing Facility   Discharge Planning Services: CM Consult   Living arrangements for the past 2 months: Single Family Home                                      Prior Living Arrangements/Services Living arrangements for the past 2 months: Single Family Home Lives with:: Self Patient language and need for interpreter reviewed:: Yes        Need for Family Participation in Patient Care: Yes (Comment) Care giver support system in place?: No (comment) Current home services: DME (cane/ rollator) Criminal Activity/Legal Involvement Pertinent to Current Situation/Hospitalization: No - Comment as needed  Activities of Daily Living Home Assistive Devices/Equipment: Cane (specify quad or straight),Walker (specify type) ADL Screening (condition at time of admission) Patient's cognitive ability adequate to safely complete daily activities?: No Is the patient deaf or have difficulty hearing?:  Yes Does the patient have difficulty seeing, even when wearing glasses/contacts?: Yes Does the patient have difficulty concentrating, remembering, or making decisions?: Yes Patient able to express need for assistance with ADLs?: Yes Does the patient have difficulty dressing or bathing?: Yes Independently performs ADLs?: No Does the patient have difficulty walking or climbing stairs?: Yes Weakness of Legs: Both Weakness of Arms/Hands: None  Permission Sought/Granted                  Emotional Assessment           Psych Involvement: No (comment)  Admission diagnosis:  Sepsis due to urinary tract infection (HCC) [A41.9, N39.0] Sepsis (HCC) [A41.9] Sepsis secondary to UTI (HCC) [A41.9, N39.0] Patient Active Problem List   Diagnosis Date Noted  . Lactic acidosis 04/17/2020  . Essential hypertension 04/17/2020  . Benign prostatic hyperplasia 04/17/2020  . Dementia without behavioral disturbance (HCC) 04/17/2020  . Presence of cardiac pacemaker 04/17/2020  . COVID-19 virus infection 04/17/2020  . Hyperglycemia 04/17/2020  . Acute cystitis without hematuria 04/17/2020  . Sepsis secondary to UTI (HCC) 04/17/2020  . Sepsis (HCC) 04/16/2020   PCP:  Patient, No Pcp Per Pharmacy:   [facility] - [place], [place] - [address] [place] [address] Phone: [phone] Fax: [phone]     Social Determinants of Health (SDOH) Interventions    Readmission Risk Interventions No flowsheet  data found.

## 2020-04-20 NOTE — TOC Progression Note (Signed)
Transition of Care [facility]) - Progression Note    Patient Details  Name: Adam Conner MRN: [id number] Date of Birth: May 03, 1933  [facility]) CM/SW Contact  Mearl Latin, LCSW Phone Number: 04/20/2020, 4:08 PM  Clinical Narrative:    CSW spoke with patient's daughter. She is requesting [facility] placement. CSW explained that they are likely unable to accept patient due to COVID positive status (and they have not taken outside referrals the past year), but CSW sent referral to [facility] anyway. Patient's daughter stated she would still like a facility near [place] so her sister can be closer to patient, however, CSW explained barriers such as having to pay out of pocket if patient require ambulance transport and that CSW is unaware of [place] facilities, so family would need to provide top choices. CSW will follow up.    Expected Discharge Plan: Skilled Nursing Facility Barriers to Discharge: Continued Medical Work up  Expected Discharge Plan and Services Expected Discharge Plan: Skilled Nursing Facility   Discharge Planning Services: CM Consult   Living arrangements for the past 2 months: Single Family Home                                       Social Determinants of Health (SDOH) Interventions    Readmission Risk Interventions No flowsheet data found.

## 2020-04-20 NOTE — [place] FL2 (Addendum)
[place] MEDICAID FL2 LEVEL OF CARE SCREENING TOOL     IDENTIFICATION  Patient Name: Adam Conner Birthdate: Jun 17, 1933 Sex: male Admission Date (Current Location): 04/16/2020  [facility] and [place] Number:  Producer, television/film/video and Address:  [facility], [address] [place], [place] [address]      Provider Number: [id number]  Attending Physician Name and Address:  Azucena Fallen, MD  Relative Name and Phone Number:  Blinda Leatherwood, sister, [phone]    Current Level of Care: Hospital Recommended Level of Care: Skilled Nursing Facility Prior Approval Number:    Date Approved/Denied:   PASRR Number: [id number]  Discharge Plan: SNF    Current Diagnoses: Patient Active Problem List   Diagnosis Date Noted   MRSA bacteremia 04/20/2020   Alcohol dependence with uncomplicated withdrawal (HCC) 04/20/2020   Lactic acidosis 04/17/2020   Essential hypertension 04/17/2020   Benign prostatic hyperplasia 04/17/2020   Dementia without behavioral disturbance (HCC) 04/17/2020   Presence of cardiac pacemaker 04/17/2020   COVID-19 virus infection 04/17/2020   Hyperglycemia 04/17/2020   Acute cystitis without hematuria 04/17/2020   Sepsis secondary to UTI (HCC) 04/17/2020   Sepsis (HCC) 04/16/2020    Orientation RESPIRATION BLADDER Height & Weight     Self  Normal Incontinent,External catheter Weight: 238 lb 5.1 oz (108.1 kg) Height:  5\' 10"  (177.8 cm)  BEHAVIORAL SYMPTOMS/MOOD NEUROLOGICAL BOWEL NUTRITION STATUS      Continent Diet (Please see DC Summary)  AMBULATORY STATUS COMMUNICATION OF NEEDS Skin   Limited Assist Verbally Normal                       Personal Care Assistance Level of Assistance  Bathing,Feeding,Dressing Bathing Assistance: Limited assistance Feeding assistance: Limited assistance Dressing Assistance: Limited assistance     Functional Limitations Info             SPECIAL CARE FACTORS FREQUENCY  PT  (By licensed PT),OT (By licensed OT)     PT Frequency: 5x/week OT Frequency: 5x/week            Contractures Contractures Info: Not present    Additional Factors Info  Code Status,Allergies,Insulin Sliding Scale Code Status Info: Full Allergies Info: NKA   Insulin Sliding Scale Info: See dc summary     Current Medications (04/20/2020):  This is the current hospital active medication list Current Facility-Administered Medications  Medication Dose Route Frequency Provider Last Rate Last Admin   acetaminophen (TYLENOL) tablet 650 mg  650 mg Oral Q6H PRN Shalhoub, 04/22/2020, MD       albuterol (VENTOLIN HFA) 108 (90 Base) MCG/ACT inhaler 2 puff  2 puff Inhalation Q4H PRN Shalhoub, Deno Lunger, MD       ascorbic acid (VITAMIN C) tablet 500 mg  500 mg Oral Daily Shalhoub, Deno Lunger, MD   500 mg at 04/20/20 0827   enoxaparin (LOVENOX) injection 40 mg  40 mg Subcutaneous QHS 04/22/20, MD   40 mg at 04/19/20 2158   folic acid (FOLVITE) tablet 1 mg  1 mg Oral Daily 2159, MD       guaiFENesin-dextromethorphan (ROBITUSSIN DM) 100-10 MG/5ML syrup 10 mL  10 mL Oral Q4H PRN Shalhoub, Azucena Fallen, MD       insulin aspart (novoLOG) injection 0-9 Units  0-9 Units Subcutaneous TID AC & HS Shalhoub, Deno Lunger, MD   1 Units at 04/20/20 0826   LORazepam (ATIVAN) tablet 1-4 mg  1-4 mg  Oral Q1H PRN Azucena Fallen, MD       Or   LORazepam (ATIVAN) injection 1-4 mg  1-4 mg Intravenous Q1H PRN Azucena Fallen, MD       LORazepam (ATIVAN) tablet 0-4 mg  0-4 mg Oral Q4H Azucena Fallen, MD       Followed by   Melene Muller ON 04/22/2020] LORazepam (ATIVAN) tablet 0-4 mg  0-4 mg Oral Q8H Azucena Fallen, MD       multivitamin with minerals tablet 1 tablet  1 tablet Oral Daily Azucena Fallen, MD       ondansetron [facility]) tablet 4 mg  4 mg Oral Q6H PRN Shalhoub, Deno Lunger, MD       Or   ondansetron [facility]) injection 4 mg  4 mg Intravenous Q6H PRN Shalhoub,  Deno Lunger, MD       polyethylene glycol (MIRALAX / GLYCOLAX) packet 17 g  17 g Oral Daily PRN Marinda Elk, MD   17 g at 04/20/20 0945   tamsulosin (FLOMAX) capsule 0.4 mg  0.4 mg Oral QPC breakfast Marinda Elk, MD   0.4 mg at 04/20/20 0827   thiamine tablet 100 mg  100 mg Oral Daily Azucena Fallen, MD       Or   thiamine (B-1) injection 100 mg  100 mg Intravenous Daily Azucena Fallen, MD       vancomycin (VANCOREADY) IVPB 1500 mg/300 mL  1,500 mg Intravenous Q24H Gardner Candle, RPH 150 mL/hr at 04/19/20 1744 1,500 mg at 04/19/20 1744   zinc sulfate capsule 220 mg  220 mg Oral Daily Shalhoub, Deno Lunger, MD   220 mg at 04/20/20 5885     Discharge Medications: Please see discharge summary for a list of discharge medications.  Relevant Imaging Results:  Relevant Lab Results:   Additional Information SSN: [id number], unvaccinated. May need IV antibiotics, off covid precautions   Mearl Latin, LCSW

## 2020-04-20 NOTE — Progress Notes (Signed)
Pharmacy Antibiotic Note  Adam Conner is a 87 y.o. male admitted on 04/16/2020 with bacteremia.  Pharmacy has been consulted for vancomycin dosing.  Blood culture rapid ID with MRSA.  Pt is now on D4 of vanc for the MRSA bacteremia. Plan for TEE due to a hx of pacemaker. Wbc has trended down to 11.1, scr remains stable <1. We will get vanc peak and trough today.   Plan: Cont vanc 1500 mg IV q 24 hrs. Vanc peak and trough today  Height: 5\' 10"  (177.8 cm) Weight: 108.1 kg (238 lb 5.1 oz) IBW/kg (Calculated) : 73  Temp (24hrs), Avg:97.8 F (36.6 C), Min:97.6 F (36.4 C), Max:97.9 F (36.6 C)  Recent Labs  Lab 04/16/20 2129 04/16/20 2317 04/18/20 0059 04/19/20 0321 04/20/20 0104  WBC 22.9*  --  20.8* 14.1* 11.1*  CREATININE 1.40*  --  1.17 0.97 0.92  LATICACIDVEN 2.7* 1.8  --   --   --     Estimated Creatinine Clearance: 73.6 mL/min (by C-G formula based on SCr of 0.92 mg/dL).    No Known Allergies  Antimicrobials this admission: Ceftriaxone 2/24 > 2/25 Vancomycin 2/25 >   Dose adjustments this admission:  Microbiology results: 2/24 UCx - proteus/ecoli  2/24 BCx - 1/2 MRSA 2/27 blood>>  3/27, PharmD, [place], AAHIVP, CPP Infectious Disease Pharmacist 04/20/2020 9:18 AM

## 2020-04-21 ENCOUNTER — Other Ambulatory Visit: Payer: Self-pay | Admitting: Infectious Diseases

## 2020-04-21 DIAGNOSIS — N3000 Acute cystitis without hematuria: Secondary | ICD-10-CM | POA: Diagnosis not present

## 2020-04-21 DIAGNOSIS — F10230 Alcohol dependence with withdrawal, uncomplicated: Secondary | ICD-10-CM

## 2020-04-21 DIAGNOSIS — E11621 Type 2 diabetes mellitus with foot ulcer: Secondary | ICD-10-CM

## 2020-04-21 DIAGNOSIS — E08621 Diabetes mellitus due to underlying condition with foot ulcer: Secondary | ICD-10-CM

## 2020-04-21 DIAGNOSIS — F0390 Unspecified dementia without behavioral disturbance: Secondary | ICD-10-CM | POA: Diagnosis not present

## 2020-04-21 DIAGNOSIS — A419 Sepsis, unspecified organism: Secondary | ICD-10-CM | POA: Diagnosis not present

## 2020-04-21 DIAGNOSIS — T827XXD Infection and inflammatory reaction due to other cardiac and vascular devices, implants and grafts, subsequent encounter: Secondary | ICD-10-CM

## 2020-04-21 DIAGNOSIS — N400 Enlarged prostate without lower urinary tract symptoms: Secondary | ICD-10-CM | POA: Diagnosis not present

## 2020-04-21 DIAGNOSIS — U071 COVID-19: Secondary | ICD-10-CM | POA: Diagnosis not present

## 2020-04-21 DIAGNOSIS — L97521 Non-pressure chronic ulcer of other part of left foot limited to breakdown of skin: Secondary | ICD-10-CM

## 2020-04-21 DIAGNOSIS — L97509 Non-pressure chronic ulcer of other part of unspecified foot with unspecified severity: Secondary | ICD-10-CM

## 2020-04-21 LAB — GLUCOSE, CAPILLARY
Glucose-Capillary: 113 mg/dL — ABNORMAL HIGH (ref 70–99)
Glucose-Capillary: 124 mg/dL — ABNORMAL HIGH (ref 70–99)
Glucose-Capillary: 117 mg/dL — ABNORMAL HIGH (ref 70–99)
Glucose-Capillary: 159 mg/dL — ABNORMAL HIGH (ref 70–99)

## 2020-04-21 LAB — VANCOMYCIN, RANDOM: Vancomycin Rm: 13

## 2020-04-21 NOTE — Progress Notes (Signed)
Pharmacy Antibiotic Note  Adam Conner is a 87 y.o. male admitted on 04/16/2020 with bacteremia.  Pharmacy has been consulted for vancomycin dosing.  Blood culture rapid ID with MRSA.  Pt is now on D4 of vanc for the MRSA bacteremia. Plan for TEE due to a hx of pacemaker. Wbc 11.8, scr remains stable. Vanc level came back today and AUC in range.   Vanc peak 27 + vanc random = AUC 417 Goal 400-550   Plan: Cont vanc 1500 mg IV q 24 hrs. Repeat levels as needed  Height: 5\' 10"  (177.8 cm) Weight: 108.1 kg (238 lb 5.1 oz) IBW/kg (Calculated) : 73  Temp (24hrs), Avg:98 F (36.7 C), Min:97.8 F (36.6 C), Max:98.1 F (36.7 C)  Recent Labs  Lab 04/16/20 2129 04/16/20 2317 04/18/20 0059 04/19/20 0321 04/20/20 0104 04/20/20 1610 04/20/20 2156 04/21/20 1030  WBC 22.9*  --  20.8* 14.1* 11.1* 11.3* 11.8*  --   CREATININE 1.40*  --  1.17 0.97 0.92 1.17 1.05  --   LATICACIDVEN 2.7* 1.8  --   --   --   --   --   --   VANCOPEAK  --   --   --   --   --   --  27*  --   VANCORANDOM  --   --   --   --   --   --   --  13    Estimated Creatinine Clearance: 64.4 mL/min (by C-G formula based on SCr of 1.05 mg/dL).    No Known Allergies  Antimicrobials this admission: Ceftriaxone 2/24 > 2/25 Vancomycin 2/25 >   Dose adjustments this admission:  Microbiology results: 2/24 UCx - proteus/ecoli  2/24 BCx - 1/2 MRSA vanc MIC 1 2/27 blood>>ngtd  3/27, PharmD, [place], AAHIVP, CPP Infectious Disease Pharmacist 04/21/2020 1:23 PM

## 2020-04-21 NOTE — Progress Notes (Signed)
[facility]  Date of Admission:  04/16/2020      Total days of antibiotics 4   Vancomycin            ASSESSMENT: Adam Conner is a 87 y.o. male with MRSA bacteremia from undetermined source. Complicated by pacemaker in situ. Repeat blood cultures are pending at this time to ensure clearance. Hold on addition of Rifampin for now until cultures are clear.  D/W EP who will see tomorrow to help with evaluation and treatment plan. For now continue Vancomycin. Will need to look into access for Daptomycin for safer treatment alternative for his infection, which may be challenging and limiting for SNF placement.   Discussed with Infection Prevention - he can come out of COVID isolation and move to regular room. Had positive test at home documented with ER visit in mid-January. He had non-severe disease and is not immunocompromised making his quarantine only 10 days from original illness onset, which has long since completed. His CT value on the PCR test is indicative of recovered disease and no longer in infectious range.     PLAN: 1. Can move out of COVID floor (d/w Dr. Natale Milch)  2. Continue vancomycin for now 3. Follow SCr while on #2  4. EP to see 3/2 to help with treatment plan and consideration of extraction  5. Will need TEE to look at RV wire (single-lead from what I can see) and native valves       Principal Problem:   Pacemaker infection (HCC) Active Problems:   MRSA bacteremia   Sepsis (HCC)   Lactic acidosis   Essential hypertension   Benign prostatic hyperplasia   Dementia without behavioral disturbance (HCC)   Presence of cardiac pacemaker   COVID-19 virus infection   Hyperglycemia   Acute cystitis without hematuria   Sepsis secondary to UTI (HCC)   Alcohol dependence with uncomplicated withdrawal (HCC)   . vitamin C  500 mg Oral Daily  . enoxaparin (LOVENOX) injection  40 mg Subcutaneous QHS  . folic acid  1 mg Oral  Daily  . insulin aspart  0-9 Units Subcutaneous TID AC & HS  . LORazepam  0-4 mg Oral Q4H   Followed by  . [START ON 04/22/2020] LORazepam  0-4 mg Oral Q8H  . multivitamin with minerals  1 tablet Oral Daily  . tamsulosin  0.4 mg Oral QPC breakfast  . thiamine  100 mg Oral Daily   Or  . thiamine  100 mg Intravenous Daily  . zinc sulfate  220 mg Oral Daily    SUBJECTIVE: Feels OK - does not contribute much. States he is not having any pain anywhere.  Cannot recall any details about pacemaker. Unclear about history of foot ulcer.    Review of Systems: Review of Systems  Constitutional: Negative for chills and fever.  HENT: Negative for tinnitus.   Eyes: Negative for blurred vision and photophobia.  Respiratory: Negative for cough and sputum production.   Cardiovascular: Negative for chest pain.  Gastrointestinal: Negative for diarrhea, nausea and vomiting.  Genitourinary: Negative for dysuria.  Skin: Negative for rash.  Neurological: Negative for headaches.    No Known Allergies  OBJECTIVE: Vitals:   04/20/20 2019 04/21/20 0415 04/21/20 0811 04/21/20 1145  BP: (!) 162/77 (!) 130/91 133/73 (!) 144/68  Pulse: 61 60 60 60  Resp: 17 20    Temp: 98.1 F (36.7 C) 98.1 F (36.7 C)  TempSrc: Oral Oral    SpO2: 100% 100%    Weight:      Height:       Body mass index is 34.2 kg/m.  Physical Exam Vitals reviewed.  Constitutional:      General: He is not in acute distress.    Appearance: Normal appearance. He is ill-appearing.  HENT:     Mouth/Throat:     Mouth: Mucous membranes are moist.     Pharynx: Oropharynx is clear.  Eyes:     General: No scleral icterus.    Pupils: Pupils are equal, round, and reactive to light.  Cardiovascular:     Rate and Rhythm: Normal rate and regular rhythm.     Heart sounds: No murmur heard.   Pulmonary:     Effort: Pulmonary effort is normal.     Breath sounds: Normal breath sounds. No stridor.  Chest:     Comments: PPM  generator site unremarkable  Abdominal:     General: Bowel sounds are normal.     Palpations: Abdomen is soft.  Musculoskeletal:        General: Normal range of motion.     Comments: L great toe with plantar ulceration. Small central opening does not appear to probe to bone. No erythema. Callous formed around it. No drainage.   Skin:    General: Skin is warm and dry.     Capillary Refill: Capillary refill takes less than 2 seconds.     Findings: No rash.  Neurological:     Mental Status: He is alert and oriented to person, place, and time.  Psychiatric:        Mood and Affect: Mood normal.        Behavior: Behavior normal.     Lab Results Lab Results  Component Value Date   WBC 11.8 (H) 04/20/2020   HGB 12.7 (L) 04/20/2020   HCT 38.8 (L) 04/20/2020   MCV 88.0 04/20/2020   PLT 236 04/20/2020    Lab Results  Component Value Date   CREATININE 1.05 04/20/2020   BUN 19 04/20/2020   NA 134 (L) 04/20/2020   K 3.6 04/20/2020   CL 100 04/20/2020   CO2 24 04/20/2020    Lab Results  Component Value Date   ALT 28 04/20/2020   AST 33 04/20/2020   ALKPHOS 61 04/20/2020   BILITOT 1.2 04/20/2020     Microbiology: Recent Results (from the past 240 hour(s))  Blood culture (routine single)     Status: Abnormal   Collection Time: 04/16/20  9:29 PM   Specimen: BLOOD  Result Value Ref Range Status   Specimen Description BLOOD LEFT ANTECUBITAL  Final   Special Requests   Final    BOTTLES DRAWN AEROBIC AND ANAEROBIC Blood Culture adequate volume   Culture  Setup Time   Final    GRAM POSITIVE COCCI IN CLUSTERS IN BOTH AEROBIC AND ANAEROBIC BOTTLES CRITICAL RESULT CALLED TO, READ BACK BY AND VERIFIED WITHArnaldo Natal PHARMD [id number] 04/17/20 A BROWNING Performed at [facility], [address] [place], [place] [address]    Culture METHICILLIN RESISTANT STAPHYLOCOCCUS AUREUS (A)  Final   Report Status 04/19/2020 FINAL  Final   Organism ID, Bacteria METHICILLIN RESISTANT  STAPHYLOCOCCUS AUREUS  Final      Susceptibility   Methicillin resistant staphylococcus aureus - MIC*    CIPROFLOXACIN >=8 RESISTANT Resistant     ERYTHROMYCIN >=8 RESISTANT Resistant     GENTAMICIN <=0.5 SENSITIVE Sensitive  OXACILLIN >=4 RESISTANT Resistant     TETRACYCLINE <=1 SENSITIVE Sensitive     VANCOMYCIN 1 SENSITIVE Sensitive     TRIMETH/SULFA <=10 SENSITIVE Sensitive     CLINDAMYCIN >=8 RESISTANT Resistant     RIFAMPIN <=0.5 SENSITIVE Sensitive     Inducible Clindamycin NEGATIVE Sensitive     * METHICILLIN RESISTANT STAPHYLOCOCCUS AUREUS  Blood Culture ID Panel (Reflexed)     Status: Abnormal   Collection Time: 04/16/20  9:29 PM  Result Value Ref Range Status   Enterococcus faecalis NOT DETECTED NOT DETECTED Final   Enterococcus Faecium NOT DETECTED NOT DETECTED Final   Listeria monocytogenes NOT DETECTED NOT DETECTED Final   Staphylococcus species DETECTED (A) NOT DETECTED Final    Comment: CRITICAL RESULT CALLED TO, READ BACK BY AND VERIFIED WITHArnaldo Natal PHARMD 1645 04/17/20 A BROWNING    Staphylococcus aureus (BCID) DETECTED (A) NOT DETECTED Final    Comment: Methicillin (oxacillin)-resistant Staphylococcus aureus (MRSA). MRSA is predictably resistant to beta-lactam antibiotics (except ceftaroline). Preferred therapy is vancomycin unless clinically contraindicated. Patient requires contact precautions if  hospitalized. CRITICAL RESULT CALLED TO, READ BACK BY AND VERIFIED WITH: Arnaldo Natal PHARMD [id number] 04/17/20 A BROWNING    Staphylococcus epidermidis NOT DETECTED NOT DETECTED Final   Staphylococcus lugdunensis NOT DETECTED NOT DETECTED Final   Streptococcus species NOT DETECTED NOT DETECTED Final   Streptococcus agalactiae NOT DETECTED NOT DETECTED Final   Streptococcus pneumoniae NOT DETECTED NOT DETECTED Final   Streptococcus pyogenes NOT DETECTED NOT DETECTED Final   A.calcoaceticus-baumannii NOT DETECTED NOT DETECTED Final   Bacteroides fragilis NOT DETECTED NOT  DETECTED Final   Enterobacterales NOT DETECTED NOT DETECTED Final   Enterobacter cloacae complex NOT DETECTED NOT DETECTED Final   Escherichia coli NOT DETECTED NOT DETECTED Final   Klebsiella aerogenes NOT DETECTED NOT DETECTED Final   Klebsiella oxytoca NOT DETECTED NOT DETECTED Final   Klebsiella pneumoniae NOT DETECTED NOT DETECTED Final   Proteus species NOT DETECTED NOT DETECTED Final   Salmonella species NOT DETECTED NOT DETECTED Final   Serratia marcescens NOT DETECTED NOT DETECTED Final   Haemophilus influenzae NOT DETECTED NOT DETECTED Final   Neisseria meningitidis NOT DETECTED NOT DETECTED Final   Pseudomonas aeruginosa NOT DETECTED NOT DETECTED Final   Stenotrophomonas maltophilia NOT DETECTED NOT DETECTED Final   Candida albicans NOT DETECTED NOT DETECTED Final   Candida auris NOT DETECTED NOT DETECTED Final   Candida glabrata NOT DETECTED NOT DETECTED Final   Candida krusei NOT DETECTED NOT DETECTED Final   Candida parapsilosis NOT DETECTED NOT DETECTED Final   Candida tropicalis NOT DETECTED NOT DETECTED Final   Cryptococcus neoformans/gattii NOT DETECTED NOT DETECTED Final   Meth resistant mecA/C and MREJ DETECTED (A) NOT DETECTED Final    Comment: CRITICAL RESULT CALLED TO, READ BACK BY AND VERIFIED WITHArnaldo Natal PHARMD 1645 04/17/20 A BROWNING Performed at [facility], [address], [place], [place] [address]   Urine culture     Status: Abnormal   Collection Time: 04/16/20 10:30 PM   Specimen: In/Out Cath Urine  Result Value Ref Range Status   Specimen Description IN/OUT CATH URINE  Final   Special Requests   Final    NONE Performed at [facility], [address], [place], [place] [address]    Culture (A)  Final    >=100,000 COLONIES/mL PROTEUS MIRABILIS >=100,000 COLONIES/mL ESCHERICHIA COLI    Report Status 04/20/2020 FINAL  Final   Organism ID, Bacteria PROTEUS MIRABILIS (A)  Final   Organism ID, Bacteria ESCHERICHIA COLI (A)  Final       Susceptibility   Escherichia coli - MIC*    AMPICILLIN >=32 RESISTANT Resistant     CEFAZOLIN 16 SENSITIVE Sensitive     CEFEPIME <=0.12 SENSITIVE Sensitive     CEFTRIAXONE <=0.25 SENSITIVE Sensitive     CIPROFLOXACIN 1 SENSITIVE Sensitive     GENTAMICIN <=1 SENSITIVE Sensitive     IMIPENEM <=0.25 SENSITIVE Sensitive     NITROFURANTOIN <=16 SENSITIVE Sensitive     TRIMETH/SULFA >=320 RESISTANT Resistant     AMPICILLIN/SULBACTAM >=32 RESISTANT Resistant     PIP/TAZO 8 SENSITIVE Sensitive     * >=100,000 COLONIES/mL ESCHERICHIA COLI   Proteus mirabilis - MIC*    AMPICILLIN <=2 SENSITIVE Sensitive     CEFAZOLIN <=4 SENSITIVE Sensitive     CEFEPIME <=0.12 SENSITIVE Sensitive     CEFTRIAXONE <=0.25 SENSITIVE Sensitive     CIPROFLOXACIN <=0.25 SENSITIVE Sensitive     GENTAMICIN <=1 SENSITIVE Sensitive     IMIPENEM 2 SENSITIVE Sensitive     NITROFURANTOIN 128 RESISTANT Resistant     TRIMETH/SULFA <=20 SENSITIVE Sensitive     AMPICILLIN/SULBACTAM <=2 SENSITIVE Sensitive     PIP/TAZO <=4 SENSITIVE Sensitive     * >=100,000 COLONIES/mL PROTEUS MIRABILIS  Resp Panel by RT-PCR (Flu A&B, Covid) Nasopharyngeal Swab     Status: Abnormal   Collection Time: 04/16/20 10:35 PM   Specimen: Nasopharyngeal Swab; Nasopharyngeal(NP) swabs in vial transport medium  Result Value Ref Range Status   SARS Coronavirus 2 by RT PCR POSITIVE (A) NEGATIVE Final    Comment: RESULT CALLED TO, READ BACK BY AND VERIFIED WITH: B SANGALANG RN 04/17/20  0022 JDW (NOTE) SARS-CoV-2 target nucleic acids are DETECTED.  The SARS-CoV-2 RNA is generally detectable in upper respiratory specimens during the acute phase of infection. Positive results are indicative of the presence of the identified virus, but do not rule out bacterial infection or co-infection with other pathogens not detected by the test. Clinical correlation with patient history and other diagnostic information is necessary to determine patient infection  status. The expected result is Negative.  Fact Sheet for Patients: BloggerCourse.comhttps://www.fda.gov/media/152166/download  Fact Sheet for Healthcare Providers: SeriousBroker.ithttps://www.fda.gov/media/152162/download  This test is not yet approved or cleared by the [place] FDA and  has been authorized for detection and/or diagnosis of SARS-CoV-2 by FDA under an Emergency Use Authorization (EUA).  This EUA will remain in effect (meaning this test can be  used) for the duration of  the COVID-19 declaration under Section 564(b)(1) of the Act, 21 U.S.C. section 360bbb-3(b)(1), unless the authorization is terminated or revoked sooner.     Influenza A by PCR NEGATIVE NEGATIVE Final   Influenza B by PCR NEGATIVE NEGATIVE Final    Comment: (NOTE) The Xpert Xpress SARS-CoV-2/FLU/RSV plus assay is intended as an aid in the diagnosis of influenza from Nasopharyngeal swab specimens and should not be used as a sole basis for treatment. Nasal washings and aspirates are unacceptable for Xpert Xpress SARS-CoV-2/FLU/RSV testing.  Fact Sheet for Patients: BloggerCourse.comhttps://www.fda.gov/media/152166/download  Fact Sheet for Healthcare Providers: SeriousBroker.ithttps://www.fda.gov/media/152162/download  This test is not yet approved or cleared by the [place] FDA and has been authorized for detection and/or diagnosis of SARS-CoV-2 by FDA under an Emergency Use Authorization (EUA). This EUA will remain in effect (meaning this test can be used) for the duration of the COVID-19 declaration under Section 564(b)(1) of the Act, 21 U.S.C. section 360bbb-3(b)(1), unless the authorization is terminated  or revoked.  Performed at [facility], [address] [place], [place] [address]   Culture, blood (Routine X 2) w Reflex to ID Panel     Status: None (Preliminary result)   Collection Time: 04/19/20  3:26 AM   Specimen: BLOOD  Result Value Ref Range Status   Specimen Description BLOOD RIGHT ANTECUBITAL  Final   Special Requests    Final    BOTTLES DRAWN AEROBIC AND ANAEROBIC Blood Culture adequate volume   Culture   Final    NO GROWTH 2 DAYS Performed at [facility], [address] [place], [place] [address]    Report Status PENDING  Incomplete  Culture, blood (Routine X 2) w Reflex to ID Panel     Status: None (Preliminary result)   Collection Time: 04/19/20  3:31 AM   Specimen: BLOOD LEFT HAND  Result Value Ref Range Status   Specimen Description BLOOD LEFT HAND  Final   Special Requests   Final    BOTTLES DRAWN AEROBIC AND ANAEROBIC Blood Culture adequate volume   Culture   Final    NO GROWTH 2 DAYS Performed at [facility], [address] [place], [place] [address]    Report Status PENDING  Incomplete     Rexene Alberts, MSN, NP-C [facility]  [email or web address] Pager: [phone] Office: [phone] [facility] Main Line: [phone]

## 2020-04-21 NOTE — Progress Notes (Signed)
Appropriate Use Committee Chart Review  Chart reviewed by the physician advisor with input from [facility] and the attending MD as needed for review of the appropriateness for SNF referral.  TOC notes, PT/OT/ST notes, nursing notes and physician notes reviewed for medical necessity to determine if the patient's needs are appropriate for short-term rehab to return to a prior level of function versus the likely need for custodial care.  At this time, the patient meets Medicare criteria for SNF placement. Was living with family prior to admission, and has not been hospitalized in the past year.   Recommendations: The patient is SNF appropriate for short-term rehab/skilled nursing interventions.  Recommend that TOC begin to discuss LTC options given his re-admission risk of 19% and his h/o dementia.     Hillery Aldo, MD Chief Physician Advisor  04/21/2020 3:47 PM

## 2020-04-21 NOTE — Progress Notes (Signed)
PROGRESS NOTE    Adam Conner  QIH:[id number] DOB: [birth year] DOA: 04/16/2020 PCP: Patient, No Pcp Per   Brief Narrative:  87 year old male with past medical history of ongoing alcohol abuse, hyperlipidemia, dementia, hypertension, bioprosthetic hyperplasia, pacemaker placement who presents to  [facility] emergency department via EMS after they were contacted by family due to progressive confusion. Patient is an extremely poor historian due to advanced dementia. Unfortunately, there are no outpatient records available and no one is answering our only contact number on file and therefore the majority of the history is been obtained from EMS and emergency department staff. In the ED patient found to be febrile with leukocytosis and tachycardia concerning for sepsis. Urinalysis concerning for UTI, antibiotics initiated at intake and patient admitted for further evaluation treatment as below. Patient incidentally found to be Covid positive, likely not related to his current sepsis findings.  Assessment & Plan:   Principal Problem:   Pacemaker infection (HCC) Active Problems:   Sepsis (HCC)   Lactic acidosis   Essential hypertension   Benign prostatic hyperplasia   Dementia without behavioral disturbance (HCC)   Presence of cardiac pacemaker   COVID-19 virus infection   Hyperglycemia   Acute cystitis without hematuria   Sepsis secondary to UTI (HCC)   MRSA bacteremia   Alcohol dependence with uncomplicated withdrawal (HCC)   Sepsis secondary to MRSA bacteremia Unlikely UTI - Patient febrile, leukocytosis, with tachypnea meeting sepsis criteria - Concurrently elevated lactic acidosis, received IV fluids per sepsis protocol. - Follow cultures - remains asymptomatic from UTI standpoint - hold GNR coverage for now - Continue Vancomycin given BCID results - De-escalate based on sensitivities - likely transition to dapto for discharge planning - EP to evaluate given pacemaker  placement - TEE to evaluate wires as well. - Continue to advance diet, patient more awake alert oriented today than previous, holding further IV fluids, patient appears to be more euvolemic at this point, SIRS criteria resolving  Acute metabolic encephalopathy on dementia without behavioral disturbance (HCC), POA, resolving Alcohol use/abue with questionable withdrawals - Alcohol use/abuse - ongoing drinker per daughter - continue CIWA protocol - daughter estimates 2-3 beers daily for decades - he admits to me 'about a 6 pack a day most days'  - Per family baseline is alert oriented to person/place/situation - admitted patient was essentially ANO x 0 and profoundly somnolent - waxing and waning but generally improving mental status over the past 72 hours - Not yet back to baseline per discussion with daughter - Continue delirium precautions, high risk for sundowning given age and comorbid's above  Incidental COVID-19 positive, NOT acute infection - Tested positive more than 21 days ago - ID following - will discontinue all treatment and precautions - Continues without hypoxia shortness of breath cough or wheeze.  - Continue supportive care but currently no indication for incentive spirometry, flutter, proning or respiratory support  Benign prostatic hyperplasia - Continue Flomax - Follow I/Os  Presence of cardiac pacemaker - EP to evaluate tomorrow given MRSA bacteremia   Diabetes mellitus type 2 without complication, questionably newly diagnosed - Distant records mention prediabetes (again follows mostly at [facility]) A1c 6.6 at intake  - Continue sliding scale insulin, hypoglycemic protocol  Code Status:  Full code Family Communication:  None available Status is: Inpatient  Dispo: The patient is from: Home              Anticipated d/c is to: To be determined  Anticipated d/c date is: 24 to 48 hours              Patient currently not medically stable for discharge given  ongoing waxing and waning mental status, need for ongoing IV antibiotics  Consultants:   None  Procedures:   None  Antimicrobials:  Ceftriaxone - discontinued 2/25 vancomycin (2/25--present) Remdesivir (2/25--2/27)  Subjective: No acute issues or events overnight, patient mental status somewhat more improved than yesterday but not back to baseline.  Denies nausea vomiting diarrhea constipation headache fevers or chills.  Objective: Vitals:   04/20/20 1449 04/20/20 1545 04/20/20 2019 04/21/20 0415  BP: (!) 146/72 134/71 (!) 162/77 (!) 130/91  Pulse: 60 63 61 60  Resp: 20  17 20   Temp: 97.8 F (36.6 C)  98.1 F (36.7 C) 98.1 F (36.7 C)  TempSrc: Oral  Oral Oral  SpO2: 96%  100% 100%  Weight:      Height:        Intake/Output Summary (Last 24 hours) at 04/21/2020 0757 Last data filed at 04/21/2020 0000 Gross per 24 hour  Intake 480 ml  Output 1600 ml  Net -1120 ml   Filed Weights   04/16/20 2056 04/17/20 0422  Weight: 110 kg 108.1 kg    Examination:  General:  Pleasantly resting in bed, No acute distress. Alert to person and place only HEENT:  Normocephalic atraumatic.  Sclerae nonicteric, noninjected.  Extraocular movements intact bilaterally. Neck:  Without mass or deformity.  Trachea is midline. Lungs:  Clear to auscultate bilaterally without rhonchi, wheeze, or rales. Heart:  Regular rate and rhythm.  Without murmurs, rubs, or gallops. Abdomen:  Soft, nontender, nondistended.  Without guarding or rebound. Extremities: Without cyanosis, clubbing, edema, or obvious deformity. Vascular:  Dorsalis pedis and posterior tibial pulses palpable bilaterally. Skin:  Warm and dry, no erythema, no ulcerations.   Data Reviewed: I have personally reviewed following labs and imaging studies  CBC: Recent Labs  Lab 04/16/20 2129 04/18/20 0059 04/19/20 0321 04/20/20 0104 04/20/20 1610 04/20/20 2156  WBC 22.9* 20.8* 14.1* 11.1* 11.3* 11.8*  NEUTROABS 18.3* 16.0* 10.3*  7.7  --  8.2*  HGB 13.6 12.6* 13.0 12.9* 12.7* 12.7*  HCT 42.7 37.2* 40.0 39.9 38.6* 38.8*  MCV 90.7 87.7 89.3 90.3 88.9 88.0  PLT 266 220 235 249 247 236   Basic Metabolic Panel: Recent Labs  Lab 04/18/20 0059 04/19/20 0321 04/20/20 0104 04/20/20 1610 04/20/20 2156  NA 136 135 136 136 134*  K 2.9* 3.2* 3.6 3.6 3.6  CL 101 100 99 99 100  CO2 24 25 26 27 24   GLUCOSE 114* 122* 90 114* 116*  BUN 23 25* 20 19 19   CREATININE 1.17 0.97 0.92 1.17 1.05  CALCIUM 8.5* 8.8* 8.8* 8.6* 8.5*  MG 1.7  --   --  1.6*  --   PHOS  --   --   --  4.1  --    GFR: Estimated Creatinine Clearance: 64.4 mL/min (by C-G formula based on SCr of 1.05 mg/dL). Liver Function Tests: Recent Labs  Lab 04/18/20 0059 04/19/20 0321 04/20/20 0104 04/20/20 1610 04/20/20 2156  AST 22 37 37 31 33  ALT 17 27 31 29 28   ALKPHOS 73 66 63 64 61  BILITOT 1.7* 1.2 1.1 0.9 1.2  PROT 6.1* 6.3* 6.3* 5.8* 5.8*  ALBUMIN 2.6* 2.6* 2.6* 2.5* 2.5*   No results for input(s): LIPASE, AMYLASE in the last 168 hours. No results for input(s): AMMONIA in the last  168 hours. Coagulation Profile: Recent Labs  Lab 04/16/20 2129  INR 1.5*   Cardiac Enzymes: No results for input(s): CKTOTAL, CKMB, CKMBINDEX, TROPONINI in the last 168 hours. BNP (last 3 results) No results for input(s): PROBNP in the last 8760 hours. HbA1C: No results for input(s): HGBA1C in the last 72 hours. CBG: Recent Labs  Lab 04/20/20 1146 04/20/20 1600 04/20/20 1710 04/20/20 2032 04/21/20 0731  GLUCAP 111* 118* 112* 121* 113*   Lipid Profile: No results for input(s): CHOL, HDL, LDLCALC, TRIG, CHOLHDL, LDLDIRECT in the last 72 hours. Thyroid Function Tests: No results for input(s): TSH, T4TOTAL, FREET4, T3FREE, THYROIDAB in the last 72 hours. Anemia Panel: No results for input(s): VITAMINB12, FOLATE, FERRITIN, TIBC, IRON, RETICCTPCT in the last 72 hours. Sepsis Labs: Recent Labs  Lab 04/16/20 2129 04/16/20 2317  PROCALCITON 0.31  --    LATICACIDVEN 2.7* 1.8    Recent Results (from the past 240 hour(s))  Blood culture (routine single)     Status: Abnormal   Collection Time: 04/16/20  9:29 PM   Specimen: BLOOD  Result Value Ref Range Status   Specimen Description BLOOD LEFT ANTECUBITAL  Final   Special Requests   Final    BOTTLES DRAWN AEROBIC AND ANAEROBIC Blood Culture adequate volume   Culture  Setup Time   Final    GRAM POSITIVE COCCI IN CLUSTERS IN BOTH AEROBIC AND ANAEROBIC BOTTLES CRITICAL RESULT CALLED TO, READ BACK BY AND VERIFIED WITHArnaldo Natal PHARMD 1610 04/17/20 A BROWNING Performed at [facility], [address] [place], [place] [address]    Culture METHICILLIN RESISTANT STAPHYLOCOCCUS AUREUS (A)  Final   Report Status 04/19/2020 FINAL  Final   Organism ID, Bacteria METHICILLIN RESISTANT STAPHYLOCOCCUS AUREUS  Final      Susceptibility   Methicillin resistant staphylococcus aureus - MIC*    CIPROFLOXACIN >=8 RESISTANT Resistant     ERYTHROMYCIN >=8 RESISTANT Resistant     GENTAMICIN <=0.5 SENSITIVE Sensitive     OXACILLIN >=4 RESISTANT Resistant     TETRACYCLINE <=1 SENSITIVE Sensitive     VANCOMYCIN 1 SENSITIVE Sensitive     TRIMETH/SULFA <=10 SENSITIVE Sensitive     CLINDAMYCIN >=8 RESISTANT Resistant     RIFAMPIN <=0.5 SENSITIVE Sensitive     Inducible Clindamycin NEGATIVE Sensitive     * METHICILLIN RESISTANT STAPHYLOCOCCUS AUREUS  Blood Culture ID Panel (Reflexed)     Status: Abnormal   Collection Time: 04/16/20  9:29 PM  Result Value Ref Range Status   Enterococcus faecalis NOT DETECTED NOT DETECTED Final   Enterococcus Faecium NOT DETECTED NOT DETECTED Final   Listeria monocytogenes NOT DETECTED NOT DETECTED Final   Staphylococcus species DETECTED (A) NOT DETECTED Final    Comment: CRITICAL RESULT CALLED TO, READ BACK BY AND VERIFIED WITHArnaldo Natal PHARMD 1645 04/17/20 A BROWNING    Staphylococcus aureus (BCID) DETECTED (A) NOT DETECTED Final    Comment: Methicillin  (oxacillin)-resistant Staphylococcus aureus (MRSA). MRSA is predictably resistant to beta-lactam antibiotics (except ceftaroline). Preferred therapy is vancomycin unless clinically contraindicated. Patient requires contact precautions if  hospitalized. CRITICAL RESULT CALLED TO, READ BACK BY AND VERIFIED WITH: Arnaldo Natal PHARMD [id number] 04/17/20 A BROWNING    Staphylococcus epidermidis NOT DETECTED NOT DETECTED Final   Staphylococcus lugdunensis NOT DETECTED NOT DETECTED Final   Streptococcus species NOT DETECTED NOT DETECTED Final   Streptococcus agalactiae NOT DETECTED NOT DETECTED Final   Streptococcus pneumoniae NOT DETECTED NOT DETECTED Final   Streptococcus pyogenes NOT DETECTED NOT  DETECTED Final   A.calcoaceticus-baumannii NOT DETECTED NOT DETECTED Final   Bacteroides fragilis NOT DETECTED NOT DETECTED Final   Enterobacterales NOT DETECTED NOT DETECTED Final   Enterobacter cloacae complex NOT DETECTED NOT DETECTED Final   Escherichia coli NOT DETECTED NOT DETECTED Final   Klebsiella aerogenes NOT DETECTED NOT DETECTED Final   Klebsiella oxytoca NOT DETECTED NOT DETECTED Final   Klebsiella pneumoniae NOT DETECTED NOT DETECTED Final   Proteus species NOT DETECTED NOT DETECTED Final   Salmonella species NOT DETECTED NOT DETECTED Final   Serratia marcescens NOT DETECTED NOT DETECTED Final   Haemophilus influenzae NOT DETECTED NOT DETECTED Final   Neisseria meningitidis NOT DETECTED NOT DETECTED Final   Pseudomonas aeruginosa NOT DETECTED NOT DETECTED Final   Stenotrophomonas maltophilia NOT DETECTED NOT DETECTED Final   Candida albicans NOT DETECTED NOT DETECTED Final   Candida auris NOT DETECTED NOT DETECTED Final   Candida glabrata NOT DETECTED NOT DETECTED Final   Candida krusei NOT DETECTED NOT DETECTED Final   Candida parapsilosis NOT DETECTED NOT DETECTED Final   Candida tropicalis NOT DETECTED NOT DETECTED Final   Cryptococcus neoformans/gattii NOT DETECTED NOT DETECTED Final    Meth resistant mecA/C and MREJ DETECTED (A) NOT DETECTED Final    Comment: CRITICAL RESULT CALLED TO, READ BACK BY AND VERIFIED WITHArnaldo Natal: J CARNEY PHARMD 1645 04/17/20 A BROWNING Performed at [facility], [address] [place], [place] [address]   Urine culture     Status: Abnormal   Collection Time: 04/16/20 10:30 PM   Specimen: In/Out Cath Urine  Result Value Ref Range Status   Specimen Description IN/OUT CATH URINE  Final   Special Requests   Final    NONE Performed at [facility], [address] [place], [place] [address]    Culture (A)  Final    >=100,000 COLONIES/mL PROTEUS MIRABILIS >=100,000 COLONIES/mL ESCHERICHIA COLI    Report Status 04/20/2020 FINAL  Final   Organism ID, Bacteria PROTEUS MIRABILIS (A)  Final   Organism ID, Bacteria ESCHERICHIA COLI (A)  Final      Susceptibility   Escherichia coli - MIC*    AMPICILLIN >=32 RESISTANT Resistant     CEFAZOLIN 16 SENSITIVE Sensitive     CEFEPIME <=0.12 SENSITIVE Sensitive     CEFTRIAXONE <=0.25 SENSITIVE Sensitive     CIPROFLOXACIN 1 SENSITIVE Sensitive     GENTAMICIN <=1 SENSITIVE Sensitive     IMIPENEM <=0.25 SENSITIVE Sensitive     NITROFURANTOIN <=16 SENSITIVE Sensitive     TRIMETH/SULFA >=320 RESISTANT Resistant     AMPICILLIN/SULBACTAM >=32 RESISTANT Resistant     PIP/TAZO 8 SENSITIVE Sensitive     * >=100,000 COLONIES/mL ESCHERICHIA COLI   Proteus mirabilis - MIC*    AMPICILLIN <=2 SENSITIVE Sensitive     CEFAZOLIN <=4 SENSITIVE Sensitive     CEFEPIME <=0.12 SENSITIVE Sensitive     CEFTRIAXONE <=0.25 SENSITIVE Sensitive     CIPROFLOXACIN <=0.25 SENSITIVE Sensitive     GENTAMICIN <=1 SENSITIVE Sensitive     IMIPENEM 2 SENSITIVE Sensitive     NITROFURANTOIN 128 RESISTANT Resistant     TRIMETH/SULFA <=20 SENSITIVE Sensitive     AMPICILLIN/SULBACTAM <=2 SENSITIVE Sensitive     PIP/TAZO <=4 SENSITIVE Sensitive     * >=100,000 COLONIES/mL PROTEUS MIRABILIS  Resp Panel by RT-PCR (Flu A&B, Covid)  Nasopharyngeal Swab     Status: Abnormal   Collection Time: 04/16/20 10:35 PM   Specimen: Nasopharyngeal Swab; Nasopharyngeal(NP) swabs in vial transport medium  Result Value Ref Range Status   SARS Coronavirus 2 by RT PCR POSITIVE (A) NEGATIVE Final    Comment: RESULT CALLED TO, READ BACK BY AND VERIFIED WITH: B SANGALANG RN 04/17/20  0022 JDW (NOTE) SARS-CoV-2 target nucleic acids are DETECTED.  The SARS-CoV-2 RNA is generally detectable in upper respiratory specimens during the acute phase of infection. Positive results are indicative of the presence of the identified virus, but do not rule out bacterial infection or co-infection with other pathogens not detected by the test. Clinical correlation with patient history and other diagnostic information is necessary to determine patient infection status. The expected result is Negative.  Fact Sheet for Patients: BloggerCourse.com  Fact Sheet for Healthcare Providers: SeriousBroker.it  This test is not yet approved or cleared by the [place] FDA and  has been authorized for detection and/or diagnosis of SARS-CoV-2 by FDA under an Emergency Use Authorization (EUA).  This EUA will remain in effect (meaning this test can be  used) for the duration of  the COVID-19 declaration under Section 564(b)(1) of the Act, 21 U.S.C. section 360bbb-3(b)(1), unless the authorization is terminated or revoked sooner.     Influenza A by PCR NEGATIVE NEGATIVE Final   Influenza B by PCR NEGATIVE NEGATIVE Final    Comment: (NOTE) The Xpert Xpress SARS-CoV-2/FLU/RSV plus assay is intended as an aid in the diagnosis of influenza from Nasopharyngeal swab specimens and should not be used as a sole basis for treatment. Nasal washings and aspirates are unacceptable for Xpert Xpress SARS-CoV-2/FLU/RSV testing.  Fact Sheet for Patients: BloggerCourse.com  Fact Sheet for  Healthcare Providers: SeriousBroker.it  This test is not yet approved or cleared by the [place] FDA and has been authorized for detection and/or diagnosis of SARS-CoV-2 by FDA under an Emergency Use Authorization (EUA). This EUA will remain in effect (meaning this test can be used) for the duration of the COVID-19 declaration under Section 564(b)(1) of the Act, 21 U.S.C. section 360bbb-3(b)(1), unless the authorization is terminated or revoked.  Performed at [facility], [address] [place], [place] [address]   Culture, blood (Routine X 2) w Reflex to ID Panel     Status: None (Preliminary result)   Collection Time: 04/19/20  3:26 AM   Specimen: BLOOD  Result Value Ref Range Status   Specimen Description BLOOD RIGHT ANTECUBITAL  Final   Special Requests   Final    BOTTLES DRAWN AEROBIC AND ANAEROBIC Blood Culture adequate volume   Culture   Final    NO GROWTH 1 DAY Performed at [facility], [address] [place], [place] [address]    Report Status PENDING  Incomplete  Culture, blood (Routine X 2) w Reflex to ID Panel     Status: None (Preliminary result)   Collection Time: 04/19/20  3:31 AM   Specimen: BLOOD LEFT HAND  Result Value Ref Range Status   Specimen Description BLOOD LEFT HAND  Final   Special Requests   Final    BOTTLES DRAWN AEROBIC AND ANAEROBIC Blood Culture adequate volume   Culture   Final    NO GROWTH 1 DAY Performed at [facility], [address] [place], [place] [address]    Report Status PENDING  Incomplete         Radiology Studies: No results found.      Scheduled Meds: . vitamin C  500 mg Oral Daily  . enoxaparin (LOVENOX) injection  40 mg Subcutaneous QHS  .  folic acid  1 mg Oral Daily  . insulin aspart  0-9 Units Subcutaneous TID AC & HS  . LORazepam  0-4 mg Oral Q4H   Followed by  . [START ON 04/22/2020] LORazepam  0-4 mg Oral Q8H  . multivitamin with minerals  1 tablet Oral  Daily  . tamsulosin  0.4 mg Oral QPC breakfast  . thiamine  100 mg Oral Daily   Or  . thiamine  100 mg Intravenous Daily  . zinc sulfate  220 mg Oral Daily   Continuous Infusions: . vancomycin 1,500 mg (04/20/20 1806)     LOS: 4 days   Time spent: 40  Azucena Fallen, DO [facility]  If 7PM-7AM, please contact night-coverage [email or web address]  04/21/2020, 7:57 AM

## 2020-04-21 NOTE — Progress Notes (Signed)
Pt transferred from rm 6 to rm 37.

## 2020-04-21 NOTE — Progress Notes (Signed)
Physical Therapy Treatment Patient Details Name: Adam Conner MRN: [id number] DOB: 11-24-1933 Today's Date: 04/21/2020    History of Present Illness Pt is an 87 y.o. male admitted 04/16/20 with progressive confusion; workup for sepsis. Incidental (+) COVID-19. PMH includes dementia, HTN, pacemaker.   PT Comments    Pt with apparent increased fatigue this session, requiring max encouragement and assist to initiate OOB mobility. Pt required maxA+2 to stand and take pivotal steps with RW. Pt limited by generalized weakness, decreased activity tolerance, poor balance and cognitive impairment, including slowed processing, decreased attention and poor awareness. Recommend SNF-level therapies to maximize functional mobility and independence prior to return home.  SpO2 100% on RA, HR 65   Follow Up Recommendations  SNF;Supervision/Assistance - 24 hour     Equipment Recommendations  None recommended by PT    Recommendations for Other Services       Precautions / Restrictions Precautions Precautions: Fall;Other (comment) Precaution Comments: Bladder/bowel incontinence Restrictions Weight Bearing Restrictions: No    Mobility  Bed Mobility Overal bed mobility: Needs Assistance Bed Mobility: Supine to Sit     Supine to sit: Max assist;HOB elevated     General bed mobility comments: Pt with minimal movement initiation despite multimodal cues, eventually requiring maxA to initiate movement, including BLE management and trnk elevation; increased time with repeated cues to scoot hips towards EOB    Transfers Overall transfer level: Needs assistance Equipment used: Rolling walker (2 wheeled) Transfers: Sit to/from [employer] Sit to Stand: Mod assist;+2 physical assistance;From elevated surface Stand pivot transfers: Mod assist;+2 safety/equipment       General transfer comment: Pt with minimal initiation to stand on first trial; requiring ModA+2 from RN to stand  from EOB to RW; appears more alert once upright/standing, able to take pivotal steps to recliner with modA+2 safety; pt with urine incontinence, requiring multimodal cues for sequencing and safety  Ambulation/Gait                 Stairs             Wheelchair Mobility    Modified Rankin (Stroke Patients Only)       Balance Overall balance assessment: Needs assistance Sitting-balance support: No upper extremity supported;Feet supported Sitting balance-Leahy Scale: Fair     Standing balance support: Bilateral upper extremity supported;During functional activity Standing balance-Leahy Scale: Poor Standing balance comment: Reliant on UE support and external assist                            Cognition Arousal/Alertness: Awake/alert Behavior During Therapy: Flat affect Overall Cognitive Status: No family/caregiver present to determine baseline cognitive functioning Area of Impairment: Orientation;Attention;Following commands;Safety/judgement;Memory;Awareness;Problem solving                 Orientation Level: Disoriented to;Time;Situation Current Attention Level: Sustained Memory: Decreased short-term memory Following Commands: Follows one step commands inconsistently;Follows one step commands with increased time Safety/Judgement: Decreased awareness of safety;Decreased awareness of deficits Awareness: Intellectual Problem Solving: Slow processing;Difficulty sequencing;Requires verbal cues;Decreased initiation;Requires tactile cues General Comments: Per chart, h/o dementia. Pt with apparent increased flat affect and difficulty following commands; requiring max encouragement and multimodal cues for OOB mobility. Pt initially conversant but then closing eyes when it came time to start moving. More alert once sitting in recliner, able to start eating his lunch      Exercises      General Comments General comments (skin integrity, edema, etc.):  SpO2  100% on RA, HR 65      Pertinent Vitals/Pain Pain Assessment: Faces Faces Pain Scale: Hurts a little bit Pain Location: stomach, L wrist (IV insertion?) Pain Descriptors / Indicators: Discomfort;Guarding Pain Intervention(s): Monitored during session    Home Living                      Prior Function            PT Goals (current goals can now be found in the care plan section) Acute Rehab PT Goals Patient Stated Goal: Family planning for post-acute rehab at SNF since 24/7 assist not available PT Goal Formulation: With family Progress towards PT goals: Not progressing toward goals - comment (increased weakness, fatigue)    Frequency    Min 2X/week      PT Plan Discharge plan needs to be updated;Frequency needs to be updated    Co-evaluation              AM-PAC PT "6 Clicks" Mobility   Outcome Measure  Help needed turning from your back to your side while in a flat bed without using bedrails?: A Lot Help needed moving from lying on your back to sitting on the side of a flat bed without using bedrails?: A Lot Help needed moving to and from a bed to a chair (including a wheelchair)?: A Lot Help needed standing up from a chair using your arms (e.g., wheelchair or bedside chair)?: A Lot Help needed to walk in hospital room?: A Lot Help needed climbing 3-5 steps with a railing? : Total 6 Click Score: 11    End of Session Equipment Utilized During Treatment: Gait belt Activity Tolerance: Patient tolerated treatment well;Patient limited by fatigue Patient left: in chair;with call bell/phone within reach;with chair alarm set Nurse Communication: Mobility status PT Visit Diagnosis: Unsteadiness on feet (R26.81);Difficulty in walking, not elsewhere classified (R26.2)     Time: 2505-3976 PT Time Calculation (min) (ACUTE ONLY): 26 min  Charges:  $Therapeutic Activity: 23-37 mins                     Ina Homes, PT, DPT Acute Rehabilitation Services   Pager [phone] Office [phone]  Malachy Chamber 04/21/2020, 3:41 PM

## 2020-04-21 NOTE — TOC Progression Note (Addendum)
Transition of Care [facility]) - Progression Note    Patient Details  Name: Adam Conner MRN: [id number] Date of Birth: 08-11-1933  Transition of Care [facility]) CM/SW Contact  Mearl Latin, LCSW Phone Number: 04/21/2020, 2:14 PM  Clinical Narrative:    2:14pm-CSW received call back from the [place] stating patient is not eligible for [facility] placement due to patient having Medicare. CSW placed call to patient's daughter to make her aware, however, voicemail is full. CSW will continue to follow for ID antibiotic recommendations.   4:45pm-CSW received return call from patient's daughter. She reported disappointment at the [place] being unable to accept patient but stated that they had been unable to locate a facility in [place]. Daughter stated that patient would not likely agree to stay at a SNF for 6 weeks if he needs IV antibiotics for that long so they would probably have to take patient home. CSW explained his current mobility status, which is a change from what he has been able to do with therapy. CSW offered SNF placement locally and she reported agreement to give family time to get extra plans in place in case patient does not agree to stay at snf. Patient currently attends a day center daily and his nephew stays with him for 10 days on and 10 days off. During the off days, patient has a caregiver that comes in the morning and nighttime to give medications and meals. She stated they can increase care at home if absolutely needed, as they promised patient he could remain in the home long term (family has tried different options like a senior living apartment in [place] etc. But patient did not like those options). CSW will send referral out further now that patient is off COVID precautions, but gave daughter the offer of [place].    Expected Discharge Plan: Skilled Nursing Facility Barriers to Discharge: Continued Medical Work up  Expected Discharge Plan and Services Expected Discharge Plan: Skilled Nursing  Facility   Discharge Planning Services: CM Consult   Living arrangements for the past 2 months: Single Family Home                                       Social Determinants of Health (SDOH) Interventions    Readmission Risk Interventions No flowsheet data found.

## 2020-04-21 NOTE — Care Management Important Message (Signed)
Important Message  Patient Details  Name: Adam Conner MRN: [id number] Date of Birth: [birth year]   Medicare Important Message Given:  Yes - Important Message mailed due to current National Emergency  Verbal consent obtained due to current National Emergency  Relationship to patient: Self Contact Name: Steen Call Date: 04/21/20  Time: 1521 Phone: [phone] Outcome: No Answer/Busy Important Message mailed to: Patient address on file     Orson Aloe 04/21/2020, 3:21 PM

## 2020-04-22 DIAGNOSIS — F0390 Unspecified dementia without behavioral disturbance: Secondary | ICD-10-CM | POA: Diagnosis not present

## 2020-04-22 DIAGNOSIS — R7881 Bacteremia: Secondary | ICD-10-CM | POA: Diagnosis not present

## 2020-04-22 DIAGNOSIS — N400 Enlarged prostate without lower urinary tract symptoms: Secondary | ICD-10-CM | POA: Diagnosis not present

## 2020-04-22 DIAGNOSIS — N3000 Acute cystitis without hematuria: Secondary | ICD-10-CM

## 2020-04-22 DIAGNOSIS — U071 COVID-19: Secondary | ICD-10-CM | POA: Diagnosis not present

## 2020-04-22 DIAGNOSIS — T827XXD Infection and inflammatory reaction due to other cardiac and vascular devices, implants and grafts, subsequent encounter: Secondary | ICD-10-CM | POA: Diagnosis not present

## 2020-04-22 DIAGNOSIS — F10230 Alcohol dependence with withdrawal, uncomplicated: Secondary | ICD-10-CM | POA: Diagnosis not present

## 2020-04-22 LAB — COMPREHENSIVE METABOLIC PANEL
ALT: 27 U/L (ref 0–44)
AST: 26 U/L (ref 15–41)
Albumin: 2.5 g/dL — ABNORMAL LOW (ref 3.5–5.0)
Alkaline Phosphatase: 67 U/L (ref 38–126)
Anion gap: 11 (ref 5–15)
BUN: 19 mg/dL (ref 8–23)
CO2: 23 mmol/L (ref 22–32)
Calcium: 9 mg/dL (ref 8.9–10.3)
Chloride: 103 mmol/L (ref 98–111)
Creatinine, Ser: 0.82 mg/dL (ref 0.61–1.24)
GFR, Estimated: >60 mL/min (ref >60)
Glucose, Bld: 125 mg/dL — ABNORMAL HIGH (ref 70–99)
Potassium: 3.9 mmol/L (ref 3.5–5.1)
Sodium: 137 mmol/L (ref 135–145)
Total Bilirubin: 1.3 mg/dL — ABNORMAL HIGH (ref 0.3–1.2)
Total Protein: 6.1 g/dL — ABNORMAL LOW (ref 6.5–8.1)

## 2020-04-22 LAB — CBC WITH DIFFERENTIAL/PLATELET
Abs Immature Granulocytes: 0.2 10*3/uL — ABNORMAL HIGH (ref 0.00–0.07)
Basophils Absolute: 0.1 10*3/uL (ref 0.0–0.1)
Basophils Relative: 0 %
Eosinophils Absolute: 0.1 10*3/uL (ref 0.0–0.5)
Eosinophils Relative: 0 %
HCT: 40.5 % (ref 39.0–52.0)
Hemoglobin: 13.1 g/dL (ref 13.0–17.0)
Immature Granulocytes: 1 %
Lymphocytes Relative: 10 %
Lymphs Abs: 1.7 10*3/uL (ref 0.7–4.0)
MCH: 29 pg (ref 26.0–34.0)
MCHC: 32.3 g/dL (ref 30.0–36.0)
MCV: 89.8 fL (ref 80.0–100.0)
Monocytes Absolute: 1.9 10*3/uL — ABNORMAL HIGH (ref 0.1–1.0)
Monocytes Relative: 11 %
Neutro Abs: 13.3 10*3/uL — ABNORMAL HIGH (ref 1.7–7.7)
Neutrophils Relative %: 78 %
Platelets: 285 10*3/uL (ref 150–400)
RBC: 4.51 MIL/uL (ref 4.22–5.81)
RDW: 13.4 % (ref 11.5–15.5)
WBC: 17.2 10*3/uL — ABNORMAL HIGH (ref 4.0–10.5)
nRBC: 0 % (ref 0.0–0.2)

## 2020-04-22 LAB — GLUCOSE, CAPILLARY
Glucose-Capillary: 127 mg/dL — ABNORMAL HIGH (ref 70–99)
Glucose-Capillary: 124 mg/dL — ABNORMAL HIGH (ref 70–99)
Glucose-Capillary: 117 mg/dL — ABNORMAL HIGH (ref 70–99)
Glucose-Capillary: 143 mg/dL — ABNORMAL HIGH (ref 70–99)

## 2020-04-22 MED ORDER — CHLORHEXIDINE GLUCONATE CLOTH 2 % EX PADS
6.0000 | MEDICATED_PAD | Freq: Two times a day (BID) | CUTANEOUS | Status: DC
  Administered 2020-04-22 – 2020-04-24 (×5): 6 via TOPICAL

## 2020-04-22 MED ORDER — RIVAROXABAN 20 MG PO TABS
20.0000 mg | ORAL_TABLET | Freq: Every evening | ORAL | Status: DC
  Administered 2020-04-22 – 2020-04-23 (×2): 20 mg via ORAL
  Filled 2020-04-22 (×2): qty 1

## 2020-04-22 NOTE — Progress Notes (Signed)
[facility]  Date of Admission:  04/16/2020      Total days of antibiotics 5   Vancomycin            ASSESSMENT: Adam Conner is a 87 y.o. male with dementia admitted with MRSA bacteremia from unidentified source. Complicated by pacemaker in situ. Repeat blood cultures seem to be clearing out. D/W EP who will see the patient today to help with treatment plan. Would be ideal if he were a candidate for extraction given high likelihood of this being infected.  For now continue Vancomycin, he is on xarelto so rifampin not looking like an option for him.   Asymptomatic bacteriuria with proteus and e coli. No urinary symptoms. He overall seems improved and at baseline without this being treated. No need for treatment at this time. Would continue to watch.   COVID-19 infection - fully recovered, done with isolation     PLAN: 1. Continue vancomycin  2. Follow troughs and SCr 3. Follow EP recommendations      Principal Problem:   MRSA bacteremia Active Problems:   Sepsis (HCC)   Lactic acidosis   Essential hypertension   Benign prostatic hyperplasia   Dementia without behavioral disturbance (HCC)   Presence of cardiac pacemaker   COVID-19 virus infection   Hyperglycemia   Acute cystitis without hematuria   Alcohol dependence with uncomplicated withdrawal (HCC)   Pacemaker infection (HCC)   Diabetic foot ulcer (HCC)   . vitamin C  500 mg Oral Daily  . enoxaparin (LOVENOX) injection  40 mg Subcutaneous QHS  . folic acid  1 mg Oral Daily  . insulin aspart  0-9 Units Subcutaneous TID AC & HS  . LORazepam  0-4 mg Oral Q4H   Followed by  . LORazepam  0-4 mg Oral Q8H  . multivitamin with minerals  1 tablet Oral Daily  . tamsulosin  0.4 mg Oral QPC breakfast  . thiamine  100 mg Oral Daily   Or  . thiamine  100 mg Intravenous Daily  . zinc sulfate  220 mg Oral Daily    SUBJECTIVE: Feels OK - eating breakfast and likes his french  toast. Thinks he is at home and I am someone who lives with him.    Review of Systems: Review of Systems  Constitutional: Negative for chills and fever.  HENT: Negative for tinnitus.   Eyes: Negative for blurred vision and photophobia.  Respiratory: Negative for cough and sputum production.   Cardiovascular: Negative for chest pain.  Gastrointestinal: Negative for diarrhea, nausea and vomiting.  Genitourinary: Negative for dysuria.  Skin: Negative for rash.  Neurological: Negative for headaches.    No Known Allergies  OBJECTIVE: Vitals:   04/21/20 2026 04/22/20 0000 04/22/20 0400 04/22/20 0557  BP: (!) 148/63   136/75  Pulse: 60 62 64 70  Resp: 20   15  Temp: 98.3 F (36.8 C)   98.5 F (36.9 C)  TempSrc: Axillary   Axillary  SpO2: 100%   94%  Weight:      Height:       Body mass index is 34.2 kg/m.  Physical Exam Vitals reviewed.  Constitutional:      General: He is not in acute distress.    Appearance: Normal appearance. He is ill-appearing.  HENT:     Mouth/Throat:     Mouth: Mucous membranes are moist.     Pharynx: Oropharynx is clear.  Eyes:  General: No scleral icterus.    Pupils: Pupils are equal, round, and reactive to light.  Cardiovascular:     Rate and Rhythm: Normal rate and regular rhythm.     Heart sounds: No murmur heard.   Pulmonary:     Effort: Pulmonary effort is normal.     Breath sounds: Normal breath sounds. No stridor.  Chest:     Comments: PPM generator site unremarkable  Abdominal:     General: Bowel sounds are normal.     Palpations: Abdomen is soft.  Musculoskeletal:        General: Normal range of motion.     Comments: L great toe with plantar ulceration. Small central opening does not appear to probe to bone. No erythema. Callous formed around it. No drainage.   Skin:    General: Skin is warm and dry.     Capillary Refill: Capillary refill takes less than 2 seconds.     Findings: No rash.  Neurological:     Mental  Status: He is alert and oriented to person, place, and time.  Psychiatric:        Mood and Affect: Mood normal.        Behavior: Behavior normal.     Lab Results Lab Results  Component Value Date   WBC 17.2 (H) 04/22/2020   HGB 13.1 04/22/2020   HCT 40.5 04/22/2020   MCV 89.8 04/22/2020   PLT 285 04/22/2020    Lab Results  Component Value Date   CREATININE 0.82 04/22/2020   BUN 19 04/22/2020   NA 137 04/22/2020   K 3.9 04/22/2020   CL 103 04/22/2020   CO2 23 04/22/2020    Lab Results  Component Value Date   ALT 27 04/22/2020   AST 26 04/22/2020   ALKPHOS 67 04/22/2020   BILITOT 1.3 (H) 04/22/2020     Microbiology: Recent Results (from the past 240 hour(s))  Blood culture (routine single)     Status: Abnormal   Collection Time: 04/16/20  9:29 PM   Specimen: BLOOD  Result Value Ref Range Status   Specimen Description BLOOD LEFT ANTECUBITAL  Final   Special Requests   Final    BOTTLES DRAWN AEROBIC AND ANAEROBIC Blood Culture adequate volume   Culture  Setup Time   Final    GRAM POSITIVE COCCI IN CLUSTERS IN BOTH AEROBIC AND ANAEROBIC BOTTLES CRITICAL RESULT CALLED TO, READ BACK BY AND VERIFIED WITHArnaldo Natal PHARMD 1324 04/17/20 A BROWNING Performed at [facility], [address], [place], [place] [address]    Culture METHICILLIN RESISTANT STAPHYLOCOCCUS AUREUS (A)  Final   Report Status 04/19/2020 FINAL  Final   Organism ID, Bacteria METHICILLIN RESISTANT STAPHYLOCOCCUS AUREUS  Final      Susceptibility   Methicillin resistant staphylococcus aureus - MIC*    CIPROFLOXACIN >=8 RESISTANT Resistant     ERYTHROMYCIN >=8 RESISTANT Resistant     GENTAMICIN <=0.5 SENSITIVE Sensitive     OXACILLIN >=4 RESISTANT Resistant     TETRACYCLINE <=1 SENSITIVE Sensitive     VANCOMYCIN 1 SENSITIVE Sensitive     TRIMETH/SULFA <=10 SENSITIVE Sensitive     CLINDAMYCIN >=8 RESISTANT Resistant     RIFAMPIN <=0.5 SENSITIVE Sensitive     Inducible Clindamycin NEGATIVE  Sensitive     * METHICILLIN RESISTANT STAPHYLOCOCCUS AUREUS  Blood Culture ID Panel (Reflexed)     Status: Abnormal   Collection Time: 04/16/20  9:29 PM  Result Value Ref Range Status  Enterococcus faecalis NOT DETECTED NOT DETECTED Final   Enterococcus Faecium NOT DETECTED NOT DETECTED Final   Listeria monocytogenes NOT DETECTED NOT DETECTED Final   Staphylococcus species DETECTED (A) NOT DETECTED Final    Comment: CRITICAL RESULT CALLED TO, READ BACK BY AND VERIFIED WITH: Arnaldo Natal PHARMD 1645 04/17/20 A BROWNING    Staphylococcus aureus (BCID) DETECTED (A) NOT DETECTED Final    Comment: Methicillin (oxacillin)-resistant Staphylococcus aureus (MRSA). MRSA is predictably resistant to beta-lactam antibiotics (except ceftaroline). Preferred therapy is vancomycin unless clinically contraindicated. Patient requires contact precautions if  hospitalized. CRITICAL RESULT CALLED TO, READ BACK BY AND VERIFIED WITH: Arnaldo Natal PHARMD [id number] 04/17/20 A BROWNING    Staphylococcus epidermidis NOT DETECTED NOT DETECTED Final   Staphylococcus lugdunensis NOT DETECTED NOT DETECTED Final   Streptococcus species NOT DETECTED NOT DETECTED Final   Streptococcus agalactiae NOT DETECTED NOT DETECTED Final   Streptococcus pneumoniae NOT DETECTED NOT DETECTED Final   Streptococcus pyogenes NOT DETECTED NOT DETECTED Final   A.calcoaceticus-baumannii NOT DETECTED NOT DETECTED Final   Bacteroides fragilis NOT DETECTED NOT DETECTED Final   Enterobacterales NOT DETECTED NOT DETECTED Final   Enterobacter cloacae complex NOT DETECTED NOT DETECTED Final   Escherichia coli NOT DETECTED NOT DETECTED Final   Klebsiella aerogenes NOT DETECTED NOT DETECTED Final   Klebsiella oxytoca NOT DETECTED NOT DETECTED Final   Klebsiella pneumoniae NOT DETECTED NOT DETECTED Final   Proteus species NOT DETECTED NOT DETECTED Final   Salmonella species NOT DETECTED NOT DETECTED Final   Serratia marcescens NOT DETECTED NOT DETECTED Final    Haemophilus influenzae NOT DETECTED NOT DETECTED Final   Neisseria meningitidis NOT DETECTED NOT DETECTED Final   Pseudomonas aeruginosa NOT DETECTED NOT DETECTED Final   Stenotrophomonas maltophilia NOT DETECTED NOT DETECTED Final   Candida albicans NOT DETECTED NOT DETECTED Final   Candida auris NOT DETECTED NOT DETECTED Final   Candida glabrata NOT DETECTED NOT DETECTED Final   Candida krusei NOT DETECTED NOT DETECTED Final   Candida parapsilosis NOT DETECTED NOT DETECTED Final   Candida tropicalis NOT DETECTED NOT DETECTED Final   Cryptococcus neoformans/gattii NOT DETECTED NOT DETECTED Final   Meth resistant mecA/C and MREJ DETECTED (A) NOT DETECTED Final    Comment: CRITICAL RESULT CALLED TO, READ BACK BY AND VERIFIED WITHArnaldo Natal PHARMD 1645 04/17/20 A BROWNING Performed at [facility], [address] [place], [place] [address]   Urine culture     Status: Abnormal   Collection Time: 04/16/20 10:30 PM   Specimen: In/Out Cath Urine  Result Value Ref Range Status   Specimen Description IN/OUT CATH URINE  Final   Special Requests   Final    NONE Performed at [facility], [address] [place], [place] [address]    Culture (A)  Final    >=100,000 COLONIES/mL PROTEUS MIRABILIS >=100,000 COLONIES/mL ESCHERICHIA COLI    Report Status 04/20/2020 FINAL  Final   Organism ID, Bacteria PROTEUS MIRABILIS (A)  Final   Organism ID, Bacteria ESCHERICHIA COLI (A)  Final      Susceptibility   Escherichia coli - MIC*    AMPICILLIN >=32 RESISTANT Resistant     CEFAZOLIN 16 SENSITIVE Sensitive     CEFEPIME <=0.12 SENSITIVE Sensitive     CEFTRIAXONE <=0.25 SENSITIVE Sensitive     CIPROFLOXACIN 1 SENSITIVE Sensitive     GENTAMICIN <=1 SENSITIVE Sensitive     IMIPENEM <=0.25 SENSITIVE Sensitive     NITROFURANTOIN <=16 SENSITIVE Sensitive  TRIMETH/SULFA >=320 RESISTANT Resistant     AMPICILLIN/SULBACTAM >=32 RESISTANT Resistant     PIP/TAZO 8 SENSITIVE Sensitive      * >=100,000 COLONIES/mL ESCHERICHIA COLI   Proteus mirabilis - MIC*    AMPICILLIN <=2 SENSITIVE Sensitive     CEFAZOLIN <=4 SENSITIVE Sensitive     CEFEPIME <=0.12 SENSITIVE Sensitive     CEFTRIAXONE <=0.25 SENSITIVE Sensitive     CIPROFLOXACIN <=0.25 SENSITIVE Sensitive     GENTAMICIN <=1 SENSITIVE Sensitive     IMIPENEM 2 SENSITIVE Sensitive     NITROFURANTOIN 128 RESISTANT Resistant     TRIMETH/SULFA <=20 SENSITIVE Sensitive     AMPICILLIN/SULBACTAM <=2 SENSITIVE Sensitive     PIP/TAZO <=4 SENSITIVE Sensitive     * >=100,000 COLONIES/mL PROTEUS MIRABILIS  Resp Panel by RT-PCR (Flu A&B, Covid) Nasopharyngeal Swab     Status: Abnormal   Collection Time: 04/16/20 10:35 PM   Specimen: Nasopharyngeal Swab; Nasopharyngeal(NP) swabs in vial transport medium  Result Value Ref Range Status   SARS Coronavirus 2 by RT PCR POSITIVE (A) NEGATIVE Final    Comment: RESULT CALLED TO, READ BACK BY AND VERIFIED WITH: B SANGALANG RN 04/17/20  0022 JDW (NOTE) SARS-CoV-2 target nucleic acids are DETECTED.  The SARS-CoV-2 RNA is generally detectable in upper respiratory specimens during the acute phase of infection. Positive results are indicative of the presence of the identified virus, but do not rule out bacterial infection or co-infection with other pathogens not detected by the test. Clinical correlation with patient history and other diagnostic information is necessary to determine patient infection status. The expected result is Negative.  Fact Sheet for Patients: BloggerCourse.comhttps://www.fda.gov/media/152166/download  Fact Sheet for Healthcare Providers: SeriousBroker.ithttps://www.fda.gov/media/152162/download  This test is not yet approved or cleared by the [place] FDA and  has been authorized for detection and/or diagnosis of SARS-CoV-2 by FDA under an Emergency Use Authorization (EUA).  This EUA will remain in effect (meaning this test can be  used) for the duration of  the COVID-19 declaration under  Section 564(b)(1) of the Act, 21 U.S.C. section 360bbb-3(b)(1), unless the authorization is terminated or revoked sooner.     Influenza A by PCR NEGATIVE NEGATIVE Final   Influenza B by PCR NEGATIVE NEGATIVE Final    Comment: (NOTE) The Xpert Xpress SARS-CoV-2/FLU/RSV plus assay is intended as an aid in the diagnosis of influenza from Nasopharyngeal swab specimens and should not be used as a sole basis for treatment. Nasal washings and aspirates are unacceptable for Xpert Xpress SARS-CoV-2/FLU/RSV testing.  Fact Sheet for Patients: BloggerCourse.comhttps://www.fda.gov/media/152166/download  Fact Sheet for Healthcare Providers: SeriousBroker.ithttps://www.fda.gov/media/152162/download  This test is not yet approved or cleared by the [place] FDA and has been authorized for detection and/or diagnosis of SARS-CoV-2 by FDA under an Emergency Use Authorization (EUA). This EUA will remain in effect (meaning this test can be used) for the duration of the COVID-19 declaration under Section 564(b)(1) of the Act, 21 U.S.C. section 360bbb-3(b)(1), unless the authorization is terminated or revoked.  Performed at [facility], [address] [place], [place] [id number]   Culture, blood (Routine X 2) w Reflex to ID Panel     Status: None (Preliminary result)   Collection Time: 04/19/20  3:26 AM   Specimen: BLOOD  Result Value Ref Range Status   Specimen Description BLOOD RIGHT ANTECUBITAL  Final   Special Requests   Final    BOTTLES DRAWN AEROBIC AND ANAEROBIC Blood Culture adequate volume   Culture   Final  NO GROWTH 3 DAYS Performed at [facility], [address] [place], [place] [address]    Report Status PENDING  Incomplete  Culture, blood (Routine X 2) w Reflex to ID Panel     Status: None (Preliminary result)   Collection Time: 04/19/20  3:31 AM   Specimen: BLOOD LEFT HAND  Result Value Ref Range Status   Specimen Description BLOOD LEFT HAND  Final   Special Requests   Final    BOTTLES  DRAWN AEROBIC AND ANAEROBIC Blood Culture adequate volume   Culture   Final    NO GROWTH 3 DAYS Performed at [facility], [address], [place], [place] [address]    Report Status PENDING  Incomplete     Rexene Alberts, MSN, NP-C [facility]  [email or web address] Pager: [phone] Office: [phone] [facility] Main Line: [phone]

## 2020-04-22 NOTE — Progress Notes (Signed)
PROGRESS NOTE        PATIENT DETAILS Name: Adam PorchMoses M Ake Age: 87 y.o. Sex: male Date of Birth: 1933-03-08 Admit Date: 04/16/2020 Admitting Physician Azucena FallenWilliam C Lancaster, MD AVW:[id number]:Patient, No Pcp Per  Brief Narrative: Patient is a 87 y.o. male HLD, HTN, EtOH use, dementia, PAF on Xarelto, history of PPM placement presented to hospital with sepsis-further evaluation revealed MRSA bacteremia.  Significant events: 2/24>> admit for sepsis-found to have MRSA bacteremia  Significant studies: 2/24>> chest x-ray: Central venous congestion 2/25>> CT abdomen/pelvis with contrast: Cholelithiasis, infrarenal aortic aneurysm 2/25>> Echo: EF 60-65%  Antimicrobial therapy: Vancomycin: 2/25>>  Microbiology data: 2/24>> blood culture: MRSA 2/24>> urine culture: Proteus/E. Coli 2/27>> blood culture: No growth  Procedures : None  Consults: ID, cardiology  DVT Prophylaxis : enoxaparin (LOVENOX) injection 40 mg Start: 04/17/20 0100   Subjective: Mildly/pleasantly confused-denies any chest pain or shortness of breath.  Assessment/Plan: Sepsis due to MRSA bacteremia: Sepsis physiology has resolved-continue IV vancomycin-await further recommendations from infectious disease.  EP/cardiology advising against PPM extraction.  Acute metabolic encephalopathy: Secondary to above-relatively awake and alert today.  Has history of dementia at baseline.  Asymptomatic bacteriuria: Does not require Abx treatment at this point  YNW:GNFAOZHYPAF:continue Telemetry-resume Xarelto-since EP not planning to remove PPM  History of PPM implantation: Unclear why this was implanted-Per EP recommendations are to pursue conservative management with suppressive antibiotic strategy rather than removing pacer.  HTN: BP stable-resume lisinopril when able.  BPH: Continue Flomax  Dementia: Pleasantly confused-maintain delirium precautions  History of alcohol use: Mildly confused but suspect this is  from dementia-no other clinical features suggestive of DTs-on Ativan per CIWA protocol.  Recent Covid infection-Home test positive on 1/19: Does not require any further isolation.  Covid PCR positive at admission-is a reflection of his recent positive test-CT value of 35 is reassuring of patient not being noninfectious.  Lab Results  Component Value Date   SARSCOV2NAA POSITIVE (A) 04/16/2020    Obesity: Estimated body mass index is 34.2 kg/m as calculated from the following:   Height as of this encounter: 5\' 10"  (1.778 m).   Weight as of this encounter: 108.1 kg.    Diet: Diet Order            Diet heart healthy/carb modified Room service appropriate? Yes; Fluid consistency: Thin  Diet effective now                  Code Status: Full code   Family Communication: Daughter-[phone] over the phone on 3/2  Disposition Plan:  Status is: Inpatient  Remains inpatient appropriate because:Inpatient level of care appropriate due to severity of illness   Dispo: The patient is from: Home              Anticipated d/c is to: SNF              Patient currently is not medically stable to d/c.   Difficult to place patient No     Barriers to Discharge: MRSA bacteremia-on IV vancomycin-work-up in progress  Antimicrobial agents: Anti-infectives (From admission, onward)   Start     Dose/Rate Route Frequency Ordered Stop   04/18/20 1800  vancomycin (VANCOREADY) IVPB 1500 mg/300 mL        1,500 mg 150 mL/hr over 120 Minutes Intravenous Every 24 hours 04/17/20 1720     04/18/20 1000  remdesivir 100 mg in sodium chloride 0.9 % 100 mL IVPB  Status:  Discontinued       "Followed by" Linked Group Details   100 mg 200 mL/hr over 30 Minutes Intravenous Daily 04/17/20 0054 04/19/20 0734   04/17/20 1815  vancomycin (VANCOREADY) IVPB 2000 mg/400 mL        2,000 mg 200 mL/hr over 120 Minutes Intravenous  Once 04/17/20 1720 04/18/20 0337   04/17/20 0100  remdesivir 200 mg in  sodium chloride 0.9% 250 mL IVPB       "Followed by" Linked Group Details   200 mg 580 mL/hr over 30 Minutes Intravenous Once 04/17/20 0054 04/17/20 0220   04/16/20 2330  cefTRIAXone (ROCEPHIN) 1 g in sodium chloride 0.9 % 100 mL IVPB  Status:  Discontinued        1 g 200 mL/hr over 30 Minutes Intravenous Every 24 hours 04/16/20 2324 04/17/20 1714       Time spent: 25 minutes-Greater than 50% of this time was spent in counseling, explanation of diagnosis, planning of further management, and coordination of care.  MEDICATIONS: Scheduled Meds: . vitamin C  500 mg Oral Daily  . Chlorhexidine Gluconate Cloth  6 each Topical BID  . enoxaparin (LOVENOX) injection  40 mg Subcutaneous QHS  . folic acid  1 mg Oral Daily  . insulin aspart  0-9 Units Subcutaneous TID AC & HS  . LORazepam  0-4 mg Oral Q4H   Followed by  . LORazepam  0-4 mg Oral Q8H  . multivitamin with minerals  1 tablet Oral Daily  . tamsulosin  0.4 mg Oral QPC breakfast  . thiamine  100 mg Oral Daily   Or  . thiamine  100 mg Intravenous Daily  . zinc sulfate  220 mg Oral Daily   Continuous Infusions: . vancomycin 1,500 mg (04/21/20 1748)   PRN Meds:.acetaminophen, albuterol, guaiFENesin-dextromethorphan, LORazepam **OR** LORazepam, ondansetron **OR** ondansetron (ZOFRAN) IV, polyethylene glycol   PHYSICAL EXAM: Vital signs: Vitals:   04/22/20 0000 04/22/20 0400 04/22/20 0557 04/22/20 1410  BP:   136/75 138/72  Pulse: 62 64 70 69  Resp:   15 18  Temp:   98.5 F (36.9 C) 98.5 F (36.9 C)  TempSrc:   Axillary Oral  SpO2:   94% 99%  Weight:      Height:       Filed Weights   04/16/20 2056 04/17/20 0422  Weight: 110 kg 108.1 kg   Body mass index is 34.2 kg/m.   Gen Exam:Alert awake-not in any distress HEENT:atraumatic, normocephalic Chest: B/L clear to auscultation anteriorly CVS:S1S2 regular Abdomen:soft non tender, non distended Extremities:no edema Neurology: Non focal Skin: no rash  I have  personally reviewed following labs and imaging studies  LABORATORY DATA: CBC: Recent Labs  Lab 04/18/20 0059 04/19/20 0321 04/20/20 0104 04/20/20 1610 04/20/20 2156 04/22/20 0156  WBC 20.8* 14.1* 11.1* 11.3* 11.8* 17.2*  NEUTROABS 16.0* 10.3* 7.7  --  8.2* 13.3*  HGB 12.6* 13.0 12.9* 12.7* 12.7* 13.1  HCT 37.2* 40.0 39.9 38.6* 38.8* 40.5  MCV 87.7 89.3 90.3 88.9 88.0 89.8  PLT 220 235 249 247 236 285    Basic Metabolic Panel: Recent Labs  Lab 04/18/20 0059 04/19/20 0321 04/20/20 0104 04/20/20 1610 04/20/20 2156 04/22/20 0156  NA 136 135 136 136 134* 137  K 2.9* 3.2* 3.6 3.6 3.6 3.9  CL 101 100 99 99 100 103  CO2 GLUCOSE 114* 122* 90  114* 116* 125*  BUN 23 25* 20 19 19 19   CREATININE 1.17 0.97 0.92 1.17 1.05 0.82  CALCIUM 8.5* 8.8* 8.8* 8.6* 8.5* 9.0  MG 1.7  --   --  1.6*  --   --   PHOS  --   --   --  4.1  --   --     GFR: Estimated Creatinine Clearance: 82.5 mL/min (by C-G formula based on SCr of 0.82 mg/dL).  Liver Function Tests: Recent Labs  Lab 04/19/20 0321 04/20/20 0104 04/20/20 1610 04/20/20 2156 04/22/20 0156  AST 37 37 31 33 26  ALT 27 31 29 28 27   ALKPHOS 66 63 64 61 67  BILITOT 1.2 1.1 0.9 1.2 1.3*  PROT 6.3* 6.3* 5.8* 5.8* 6.1*  ALBUMIN 2.6* 2.6* 2.5* 2.5* 2.5*   No results for input(s): LIPASE, AMYLASE in the last 168 hours. No results for input(s): AMMONIA in the last 168 hours.  Coagulation Profile: Recent Labs  Lab 04/16/20 2129  INR 1.5*    Cardiac Enzymes: No results for input(s): CKTOTAL, CKMB, CKMBINDEX, TROPONINI in the last 168 hours.  BNP (last 3 results) No results for input(s): PROBNP in the last 8760 hours.  Lipid Profile: No results for input(s): CHOL, HDL, LDLCALC, TRIG, CHOLHDL, LDLDIRECT in the last 72 hours.  Thyroid Function Tests: No results for input(s): TSH, T4TOTAL, FREET4, T3FREE, THYROIDAB in the last 72 hours.  Anemia Panel: No results for input(s): VITAMINB12, FOLATE,  FERRITIN, TIBC, IRON, RETICCTPCT in the last 72 hours.  Urine analysis:    Component Value Date/Time   COLORURINE AMBER (A) 04/16/2020 2230   APPEARANCEUR CLOUDY (A) 04/16/2020 2230   LABSPEC 1.017 04/16/2020 2230   PHURINE 8.0 04/16/2020 2230   GLUCOSEU NEGATIVE 04/16/2020 2230   HGBUR SMALL (A) 04/16/2020 2230   BILIRUBINUR NEGATIVE 04/16/2020 2230   KETONESUR NEGATIVE 04/16/2020 2230   PROTEINUR 30 (A) 04/16/2020 2230   UROBILINOGEN 4.0 (H) 12/15/2008 0600   NITRITE NEGATIVE 04/16/2020 2230   LEUKOCYTESUR LARGE (A) 04/16/2020 2230    Sepsis Labs: Lactic Acid, Venous    Component Value Date/Time   LATICACIDVEN 1.8 04/16/2020 2317    MICROBIOLOGY: Recent Results (from the past 240 hour(s))  Blood culture (routine single)     Status: Abnormal   Collection Time: 04/16/20  9:29 PM   Specimen: BLOOD  Result Value Ref Range Status   Specimen Description BLOOD LEFT ANTECUBITAL  Final   Special Requests   Final    BOTTLES DRAWN AEROBIC AND ANAEROBIC Blood Culture adequate volume   Culture  Setup Time   Final    GRAM POSITIVE COCCI IN CLUSTERS IN BOTH AEROBIC AND ANAEROBIC BOTTLES CRITICAL RESULT CALLED TO, READ BACK BY AND VERIFIED WITH02/26/2022 PHARMD 04/18/20 04/17/20 A BROWNING Performed at [facility], [address], [place], [address] [place]    Culture METHICILLIN RESISTANT STAPHYLOCOCCUS AUREUS (A)  Final   Report Status 04/19/2020 FINAL  Final   Organism ID, Bacteria METHICILLIN RESISTANT STAPHYLOCOCCUS AUREUS  Final      Susceptibility   Methicillin resistant staphylococcus aureus - MIC*    CIPROFLOXACIN >=8 RESISTANT Resistant     ERYTHROMYCIN >=8 RESISTANT Resistant     GENTAMICIN <=0.5 SENSITIVE Sensitive     OXACILLIN >=4 RESISTANT Resistant     TETRACYCLINE <=1 SENSITIVE Sensitive     VANCOMYCIN 1 SENSITIVE Sensitive     TRIMETH/SULFA <=10 SENSITIVE Sensitive     CLINDAMYCIN >=8 RESISTANT Resistant  RIFAMPIN <=0.5 SENSITIVE Sensitive     Inducible  Clindamycin NEGATIVE Sensitive     * METHICILLIN RESISTANT STAPHYLOCOCCUS AUREUS  Blood Culture ID Panel (Reflexed)     Status: Abnormal   Collection Time: 04/16/20  9:29 PM  Result Value Ref Range Status   Enterococcus faecalis NOT DETECTED NOT DETECTED Final   Enterococcus Faecium NOT DETECTED NOT DETECTED Final   Listeria monocytogenes NOT DETECTED NOT DETECTED Final   Staphylococcus species DETECTED (A) NOT DETECTED Final    Comment: CRITICAL RESULT CALLED TO, READ BACK BY AND VERIFIED WITHArnaldo Natal PHARMD 1645 04/17/20 A BROWNING    Staphylococcus aureus (BCID) DETECTED (A) NOT DETECTED Final    Comment: Methicillin (oxacillin)-resistant Staphylococcus aureus (MRSA). MRSA is predictably resistant to beta-lactam antibiotics (except ceftaroline). Preferred therapy is vancomycin unless clinically contraindicated. Patient requires contact precautions if  hospitalized. CRITICAL RESULT CALLED TO, READ BACK BY AND VERIFIED WITH: Arnaldo Natal PHARMD [id number] 04/17/20 A BROWNING    Staphylococcus epidermidis NOT DETECTED NOT DETECTED Final   Staphylococcus lugdunensis NOT DETECTED NOT DETECTED Final   Streptococcus species NOT DETECTED NOT DETECTED Final   Streptococcus agalactiae NOT DETECTED NOT DETECTED Final   Streptococcus pneumoniae NOT DETECTED NOT DETECTED Final   Streptococcus pyogenes NOT DETECTED NOT DETECTED Final   A.calcoaceticus-baumannii NOT DETECTED NOT DETECTED Final   Bacteroides fragilis NOT DETECTED NOT DETECTED Final   Enterobacterales NOT DETECTED NOT DETECTED Final   Enterobacter cloacae complex NOT DETECTED NOT DETECTED Final   Escherichia coli NOT DETECTED NOT DETECTED Final   Klebsiella aerogenes NOT DETECTED NOT DETECTED Final   Klebsiella oxytoca NOT DETECTED NOT DETECTED Final   Klebsiella pneumoniae NOT DETECTED NOT DETECTED Final   Proteus species NOT DETECTED NOT DETECTED Final   Salmonella species NOT DETECTED NOT DETECTED Final   Serratia marcescens NOT  DETECTED NOT DETECTED Final   Haemophilus influenzae NOT DETECTED NOT DETECTED Final   Neisseria meningitidis NOT DETECTED NOT DETECTED Final   Pseudomonas aeruginosa NOT DETECTED NOT DETECTED Final   Stenotrophomonas maltophilia NOT DETECTED NOT DETECTED Final   Candida albicans NOT DETECTED NOT DETECTED Final   Candida auris NOT DETECTED NOT DETECTED Final   Candida glabrata NOT DETECTED NOT DETECTED Final   Candida krusei NOT DETECTED NOT DETECTED Final   Candida parapsilosis NOT DETECTED NOT DETECTED Final   Candida tropicalis NOT DETECTED NOT DETECTED Final   Cryptococcus neoformans/gattii NOT DETECTED NOT DETECTED Final   Meth resistant mecA/C and MREJ DETECTED (A) NOT DETECTED Final    Comment: CRITICAL RESULT CALLED TO, READ BACK BY AND VERIFIED WITHArnaldo Natal PHARMD 1645 04/17/20 A BROWNING Performed at [facility], [address] [place], [place] [address]   Urine culture     Status: Abnormal   Collection Time: 04/16/20 10:30 PM   Specimen: In/Out Cath Urine  Result Value Ref Range Status   Specimen Description IN/OUT CATH URINE  Final   Special Requests   Final    NONE Performed at [facility], [address] [place], [place] [address]    Culture (A)  Final    >=100,000 COLONIES/mL PROTEUS MIRABILIS >=100,000 COLONIES/mL ESCHERICHIA COLI    Report Status 04/20/2020 FINAL  Final   Organism ID, Bacteria PROTEUS MIRABILIS (A)  Final   Organism ID, Bacteria ESCHERICHIA COLI (A)  Final      Susceptibility   Escherichia coli - MIC*    AMPICILLIN >=32 RESISTANT Resistant     CEFAZOLIN 16 SENSITIVE Sensitive  CEFEPIME <=0.12 SENSITIVE Sensitive     CEFTRIAXONE <=0.25 SENSITIVE Sensitive     CIPROFLOXACIN 1 SENSITIVE Sensitive     GENTAMICIN <=1 SENSITIVE Sensitive     IMIPENEM <=0.25 SENSITIVE Sensitive     NITROFURANTOIN <=16 SENSITIVE Sensitive     TRIMETH/SULFA >=320 RESISTANT Resistant     AMPICILLIN/SULBACTAM >=32 RESISTANT Resistant     PIP/TAZO  8 SENSITIVE Sensitive     * >=100,000 COLONIES/mL ESCHERICHIA COLI   Proteus mirabilis - MIC*    AMPICILLIN <=2 SENSITIVE Sensitive     CEFAZOLIN <=4 SENSITIVE Sensitive     CEFEPIME <=0.12 SENSITIVE Sensitive     CEFTRIAXONE <=0.25 SENSITIVE Sensitive     CIPROFLOXACIN <=0.25 SENSITIVE Sensitive     GENTAMICIN <=1 SENSITIVE Sensitive     IMIPENEM 2 SENSITIVE Sensitive     NITROFURANTOIN 128 RESISTANT Resistant     TRIMETH/SULFA <=20 SENSITIVE Sensitive     AMPICILLIN/SULBACTAM <=2 SENSITIVE Sensitive     PIP/TAZO <=4 SENSITIVE Sensitive     * >=100,000 COLONIES/mL PROTEUS MIRABILIS  Resp Panel by RT-PCR (Flu A&B, Covid) Nasopharyngeal Swab     Status: Abnormal   Collection Time: 04/16/20 10:35 PM   Specimen: Nasopharyngeal Swab; Nasopharyngeal(NP) swabs in vial transport medium  Result Value Ref Range Status   SARS Coronavirus 2 by RT PCR POSITIVE (A) NEGATIVE Final    Comment: RESULT CALLED TO, READ BACK BY AND VERIFIED WITH: B SANGALANG RN 04/17/20  0022 JDW (NOTE) SARS-CoV-2 target nucleic acids are DETECTED.  The SARS-CoV-2 RNA is generally detectable in upper respiratory specimens during the acute phase of infection. Positive results are indicative of the presence of the identified virus, but do not rule out bacterial infection or co-infection with other pathogens not detected by the test. Clinical correlation with patient history and other diagnostic information is necessary to determine patient infection status. The expected result is Negative.  Fact Sheet for Patients: BloggerCourse.com  Fact Sheet for Healthcare Providers: SeriousBroker.it  This test is not yet approved or cleared by the [place] FDA and  has been authorized for detection and/or diagnosis of SARS-CoV-2 by FDA under an Emergency Use Authorization (EUA).  This EUA will remain in effect (meaning this test can be  used) for the duration of  the  COVID-19 declaration under Section 564(b)(1) of the Act, 21 U.S.C. section 360bbb-3(b)(1), unless the authorization is terminated or revoked sooner.     Influenza A by PCR NEGATIVE NEGATIVE Final   Influenza B by PCR NEGATIVE NEGATIVE Final    Comment: (NOTE) The Xpert Xpress SARS-CoV-2/FLU/RSV plus assay is intended as an aid in the diagnosis of influenza from Nasopharyngeal swab specimens and should not be used as a sole basis for treatment. Nasal washings and aspirates are unacceptable for Xpert Xpress SARS-CoV-2/FLU/RSV testing.  Fact Sheet for Patients: BloggerCourse.com  Fact Sheet for Healthcare Providers: SeriousBroker.it  This test is not yet approved or cleared by the [place] FDA and has been authorized for detection and/or diagnosis of SARS-CoV-2 by FDA under an Emergency Use Authorization (EUA). This EUA will remain in effect (meaning this test can be used) for the duration of the COVID-19 declaration under Section 564(b)(1) of the Act, 21 U.S.C. section 360bbb-3(b)(1), unless the authorization is terminated or revoked.  Performed at [facility], [address], [place], [place] [address]   Culture, blood (Routine X 2) w Reflex to ID Panel     Status: None (Preliminary result)   Collection Time: 04/19/20  3:26 AM   Specimen: BLOOD  Result Value Ref Range Status   Specimen Description BLOOD RIGHT ANTECUBITAL  Final   Special Requests   Final    BOTTLES DRAWN AEROBIC AND ANAEROBIC Blood Culture adequate volume   Culture   Final    NO GROWTH 3 DAYS Performed at [facility], [address], [place], [place] [address]    Report Status PENDING  Incomplete  Culture, blood (Routine X 2) w Reflex to ID Panel     Status: None (Preliminary result)   Collection Time: 04/19/20  3:31 AM   Specimen: BLOOD LEFT HAND  Result Value Ref Range Status   Specimen Description BLOOD LEFT HAND  Final   Special  Requests   Final    BOTTLES DRAWN AEROBIC AND ANAEROBIC Blood Culture adequate volume   Culture   Final    NO GROWTH 3 DAYS Performed at [facility], [address] [place], [place] [address]    Report Status PENDING  Incomplete    RADIOLOGY STUDIES/RESULTS: No results found.   LOS: 5 days   Jeoffrey Massed, MD  [facility]    To contact the attending provider between 7A-7P or the covering provider during after hours 7P-7A, please log into the web site [email or web address] and access using universal [place] password for that web site. If you do not have the password, please call the hospital operator.  04/22/2020, 3:23 PM

## 2020-04-22 NOTE — Plan of Care (Signed)
Patient is currently resting in bed. VSS. Bed alarm on. Call bell within reach. No complaints overnight.   Problem: Education: Goal: Knowledge of General Education information will improve Description: Including pain rating scale, medication(s)/side effects and non-pharmacologic comfort measures Outcome: Progressing   Problem: Health Behavior/Discharge Planning: Goal: Ability to manage health-related needs will improve Outcome: Progressing   Problem: Clinical Measurements: Goal: Ability to maintain clinical measurements within normal limits will improve Outcome: Progressing Goal: Will remain free from infection Outcome: Progressing Goal: Diagnostic test results will improve Outcome: Progressing Goal: Respiratory complications will improve Outcome: Progressing Goal: Cardiovascular complication will be avoided Outcome: Progressing   Problem: Nutrition: Goal: Adequate nutrition will be maintained Outcome: Progressing   Problem: Activity: Goal: Risk for activity intolerance will decrease Outcome: Progressing   Problem: Coping: Goal: Level of anxiety will decrease Outcome: Progressing   Problem: Elimination: Goal: Will not experience complications related to bowel motility Outcome: Progressing Goal: Will not experience complications related to urinary retention Outcome: Progressing   Problem: Pain Managment: Goal: General experience of comfort will improve Outcome: Progressing   Problem: Safety: Goal: Ability to remain free from injury will improve Outcome: Progressing   Problem: Skin Integrity: Goal: Risk for impaired skin integrity will decrease Outcome: Progressing   Problem: Education: Goal: Knowledge of risk factors and measures for prevention of condition will improve Outcome: Progressing

## 2020-04-22 NOTE — Consult Note (Addendum)
Cardiology Consultation:   Patient ID: Adam Conner MRN: [id number]; DOB: 30-Aug-1933  Admit date: 04/16/2020 Date of Consult: 04/22/2020  PCP:  Patient, No Pcp Per   [facility]  Cardiologist:  unknown :[id number]}    Patient Profile:   Adam Conner is a 87 y.o. male with a hx of  HTN, HLD, BPH,  dementia, ETOH use (abuse) ongoing, PPM  who is being seen today for the evaluation of bacteremia at the request of Dr. Jerral Ralph.  History of Present Illness:   Adam Conner was admitted with AMS, progressive confusion, he was found febrile in the ER elevated WBC, tachycardic, septic, abnormal UTI, and COVID + Ultimately found with MRSA bacteremia COVID felt a hang over from a + test weeks prior.  OFF COVID PRECAUTIONS  EP is asked to weigh in on PPM and MRSA bacteremia    Past Medical History:  Diagnosis Date  . Hypertension     History reviewed. No pertinent surgical history.   Home Medications:  Prior to Admission medications   Medication Sig Start Date End Date Taking? Authorizing Provider  Cholecalciferol (VITAMIN D-3) 25 MCG (1000 UT) CAPS Take 1,000 Units by mouth in the morning.   Yes [provider]  Docusate Calcium (STOOL SOFTENER PO) Take 1 capsule by mouth in the morning and at bedtime.   Yes [provider]  folic acid (FOLVITE) 1 MG tablet Take 1 mg by mouth in the morning.   Yes [provider]  lisinopril (ZESTRIL) 40 MG tablet Take 40 mg by mouth every evening.   Yes [provider]  Magnesium Oxide 420 MG TABS Take 420 mg by mouth in the morning.   Yes [provider]  Multiple Vitamins-Minerals (SENIOR VITES PO) Take 1 tablet by mouth daily with breakfast.   Yes [provider]  oxybutynin (DITROPAN) 5 MG tablet Take 5 mg by mouth in the morning and at bedtime.   Yes [provider]  rivaroxaban (XARELTO) 20 MG TABS tablet Take 20 mg by mouth every evening.   Yes [provider]  tamsulosin (FLOMAX) 0.4 MG CAPS Take 0.4 mg by mouth daily after breakfast.   Yes [provider]  thiamine 100 MG tablet Take 100 mg by mouth in the morning.   Yes [provider]    Inpatient Medications: Scheduled Meds: . vitamin C  500 mg Oral Daily  . Chlorhexidine Gluconate Cloth  6 each Topical BID  . enoxaparin (LOVENOX) injection  40 mg Subcutaneous QHS  . folic acid  1 mg Oral Daily  . insulin aspart  0-9 Units Subcutaneous TID AC & HS  . LORazepam  0-4 mg Oral Q4H   Followed by  . LORazepam  0-4 mg Oral Q8H  . multivitamin with minerals  1 tablet Oral Daily  . tamsulosin  0.4 mg Oral QPC breakfast  . thiamine  100 mg Oral Daily   Or  . thiamine  100 mg Intravenous Daily  . zinc sulfate  220 mg Oral Daily   Continuous Infusions: . vancomycin 1,500 mg (04/21/20 1748)   PRN Meds: acetaminophen, albuterol, guaiFENesin-dextromethorphan, LORazepam **OR** LORazepam, ondansetron **OR** ondansetron (ZOFRAN) IV, polyethylene glycol  Allergies:   No Known Allergies  Social History:   Social History   Socioeconomic History  . Marital status: Single    Spouse name: Not on file  . Number of children: Not on file  . Years of education: Not on file  .  Highest education level: Not on file  Occupational History  . Not on file  Tobacco Use  . Smoking status: Former Games developer  . Smokeless tobacco: Never Used  Substance and Sexual Activity  . Alcohol use: No  . Drug use: No  . Sexual activity: Not on file  Other Topics Concern  . Not on file  Social History Narrative  . Not on file   Social Determinants of Health   Financial Resource Strain: Not on file  Food Insecurity: Not on file  Transportation Needs: Not on file  Physical Activity: Not on file  Stress: Not on file  Social Connections: Not on file  Intimate Partner Violence: Not on file    Family History:   History reviewed. Unable to obtain 2/2 dementia.   ROS:  Please see  the history of present illness.  All other ROS reviewed and negative.     Physical Exam/Data:   Vitals:   04/22/20 0000 04/22/20 0400 04/22/20 0557 04/22/20 1410  BP:   136/75 138/72  Pulse: 62 64 70 69  Resp:   15 18  Temp:   98.5 F (36.9 C) 98.5 F (36.9 C)  TempSrc:   Axillary Oral  SpO2:   94% 99%  Weight:      Height:        Intake/Output Summary (Last 24 hours) at 04/22/2020 1447 Last data filed at 04/22/2020 1037 Gross per 24 hour  Intake 60 ml  Output 1400 ml  Net -1340 ml   Last 3 Weights 04/17/2020 04/16/2020 05/22/2011  Weight (lbs) 238 lb 5.1 oz 242 lb 8.1 oz 220 lb  Weight (kg) 108.1 kg 110 kg 99.791 kg     Body mass index is 34.2 kg/m.   Seen and examined by Dr. Ladona Ridgel General:  Well nourished, chronically ill appearing HEENT: normal Lymph: no adenopathy Neck: no JVD Endocrine:  No thryomegaly Vascular: No carotid bruits Cardiac: RRR; no murmurs gallops or rubs Lungs:   CTA b/l,  no wheezing, rhonchi or rales  Abd: soft, nontender Ext: no edema Musculoskeletal:  No deformities Skin: warm and dry  Neuro:  Difficult to assess, woke only enough to tell Dr. Ladona Ridgel he was in [place] [facility]:  Unable to assess  EKG:  The EKG was personally reviewed and demonstrates:   AFib, V paced  Telemetry:  Telemetry was personally reviewed and demonstrates:   AF V paced  Relevant CV Studies:  04/17/20: TTE IMPRESSIONS 1. Left ventricular ejection fraction, by estimation, is 60 to 65%. The  left ventricle has normal function. The left ventricle has no regional  wall motion abnormalities. There is mild left ventricular hypertrophy.  2. Right ventricular systolic function is normal. The right ventricular  size is normal.  3. Left atrial size was moderately dilated.  4. The mitral valve is normal in structure. Trivial mitral valve  regurgitation. No evidence of mitral stenosis.  5. The aortic valve is normal in structure. There is moderate  calcification of  the aortic valve. There is moderate thickening of the  aortic valve. Aortic valve regurgitation is not visualized. Mild to  moderate aortic valve sclerosis/calcification is  present, without any evidence of aortic stenosis.  6. The inferior vena cava is dilated in size with <50% respiratory  variability, suggesting right atrial pressure of 15 mmHg.   Laboratory Data:  High Sensitivity Troponin:  No results for input(s): TROPONINIHS in the last 720 hours.   Chemistry Recent Labs  Lab 04/20/20 1610 04/20/20 2156 04/22/20  0156  NA 136 134* 137  K 3.6 3.6 3.9  CL 99 100 103  CO2 27 24 23   GLUCOSE 114* 116* 125*  BUN 19 19 19   CREATININE 1.17 1.05 0.82  CALCIUM 8.6* 8.5* 9.0  GFRNONAA >60 >60 >60  ANIONGAP 10 10 11     Recent Labs  Lab 04/20/20 1610 04/20/20 2156 04/22/20 0156  PROT 5.8* 5.8* 6.1*  ALBUMIN 2.5* 2.5* 2.5*  AST 31 33 26  ALT 29 28 27   ALKPHOS 64 61 67  BILITOT 0.9 1.2 1.3*   Hematology Recent Labs  Lab 04/20/20 1610 04/20/20 2156 04/22/20 0156  WBC 11.3* 11.8* 17.2*  RBC 4.34 4.41 4.51  HGB 12.7* 12.7* 13.1  HCT 38.6* 38.8* 40.5  MCV 88.9 88.0 89.8  MCH 29.3 28.8 29.0  MCHC 32.9 32.7 32.3  RDW 13.2 13.2 13.4  PLT 247 236 285   BNP Recent Labs  Lab 04/16/20 2351  BNP 295.0*    DDimer  Recent Labs  Lab 04/16/20 2129 04/18/20 0059 04/19/20 0321  DDIMER 2.70* 0.83* 0.69*     Radiology/Studies:  No results found.   Assessment and Plan:   1. PPM     Is a MDT device Implanted in 04/18/20 08/27/2013 [employer] single chamber PPM with [id number] lead  I called his daughter, unable to reach her currently Unknown who is monitoring/managing his device, perhaps remotely by his implanting MD, in d/w [employer] is being actively monitored via a national monitoring service.  Dr. 04/20/20 saw and examined the patient this morning Found him confused, hard to arouse and did not participate in his visit and recommended suppressive antibiotic strategy He  is known to have baseline dementia In d/w ID NP, for her he was talkative but confused  Given his mental status and comorbid conditions, Dr. 04/21/20 recommends conservative management with suppressive antibiotic strategy  We can offer outpatient cardiology/EP, pacer follow up if needed. Would inquire with his daughter when she is here Please call if they need local pacer follow up and will be happy to arrange with [place] outpatient  Risk Assessment/Risk Scores:  {  For questions or updates, please contact [facility] Please consult [email or web address] for contact info under    Signed, 10/28/2013, PA-C  04/22/2020 2:47 PM  EP Attending Patient seen and examined. On my presentation he could only answer that he was from [place] and then was unresponsive to verbal stimulus. He appears to be dependent and his pocket demonstrates no swelling. He appears to be PM dependent. He is a poor candidate for PM system extraction based on mental status. If he clinically improves we could reconsider. His long term prognosis is poor. He would require a temp-perm and then a leadless PPM. Long term oral anti-biotics for palliation might be more appropriate.   [place] Inna Tisdell,MD

## 2020-04-22 NOTE — Progress Notes (Signed)
Occupational Therapy Treatment Patient Details Name: Adam Conner MRN: [id number] DOB: [birth year] Today's Date: 04/22/2020    History of present illness Pt is an 87 y.o. male admitted 04/16/20 with progressive confusion; workup for sepsis. Incidental (+) COVID-19. PMH includes dementia, HTN, pacemaker.   OT comments  Pt demonstrates a significant decline in function since OT evaluation.  Today, he required max A for functional transfers using lift equipment (on eval was ambulating in room with min A and RW).   He was slow to initiate activity and slow to respond to commands.  Per chart, family may take him home. IF they decide to do so, recommend 24 hour assist, w/c BSC, possible hospital bed and possible hoyer lift.  Will continue to follow.   Follow Up Recommendations  SNF    Equipment Recommendations  3 in 1 bedside commode    Recommendations for Other Services      Precautions / Restrictions Precautions Precautions: Fall Precaution Comments: Bladder/bowel incontinence       Mobility Bed Mobility Overal bed mobility: Needs Assistance Bed Mobility: Supine to Sit     Supine to sit: Max assist;HOB elevated     General bed mobility comments: Pt requires increased time to initiate task.  After he initiates, he tends to stop and demonstrates significant difficulty reinitiating    Transfers Overall transfer level: Needs assistance Equipment used: Ambulation equipment used Transfers: Sit to/from Stand Sit to Stand: Mod assist         General transfer comment: Pt required 2 attempts to stand successfully from the bed.  He required assist to lift buttocks from the bed.   Once he had moved to the recliner, attempted to stand again from the recliner, with the stedy, but pt unable to lift buttocks with +1 assist    Balance Overall balance assessment: Needs assistance Sitting-balance support: No upper extremity supported;Feet supported Sitting balance-Leahy Scale:  Fair Sitting balance - Comments: supervision for static sitting EOB   Standing balance support: Bilateral upper extremity supported;During functional activity Standing balance-Leahy Scale: Poor Standing balance comment: reliant on UE support.  He was able to maintain static standing in the stedy with bil. UE support with close min guard assist                           ADL either performed or assessed with clinical judgement   ADL Overall ADL's : Needs assistance/impaired Eating/Feeding: Set up                       Toilet Transfer: Maximal assistance;Stand-pivot;BSC (with use of stedy)   Toileting- Clothing Manipulation and Hygiene: Total assistance;Sit to/from stand       Functional mobility during ADLs: Maximal assistance General ADL Comments: Pt demonstrates a significant delay with processing and performing activities and thus requires assist with all aspects of ADLs     Vision       Perception     Praxis      Cognition Arousal/Alertness: Awake/alert;Lethargic Behavior During Therapy: Flat affect Overall Cognitive Status: No family/caregiver present to determine baseline cognitive functioning                                 General Comments: Pt is slow to initiate activity, and requires increased time to respond to questions.  He follows one step commands consistently with a delay and  cues        Exercises     Shoulder Instructions       General Comments VSS    Pertinent Vitals/ Pain       Pain Assessment: Faces Faces Pain Scale: No hurt  Home Living                                          Prior Functioning/Environment              Frequency  Min 2X/week        Progress Toward Goals  OT Goals(current goals can now be found in the care plan section)  Progress towards OT goals: Not progressing toward goals - comment     Plan Discharge plan needs to be updated    Co-evaluation                  AM-PAC OT "6 Clicks" Daily Activity     Outcome Measure   Help from another person eating meals?: A Little Help from another person taking care of personal grooming?: A Little Help from another person toileting, which includes using toliet, bedpan, or urinal?: A Lot Help from another person bathing (including washing, rinsing, drying)?: A Lot Help from another person to put on and taking off regular upper body clothing?: A Lot Help from another person to put on and taking off regular lower body clothing?: Total 6 Click Score: 13    End of Session Equipment Utilized During Treatment: Other (comment) (stedy)  OT Visit Diagnosis: Unsteadiness on feet (R26.81);Muscle weakness (generalized) (M62.81);Other symptoms and signs involving cognitive function   Activity Tolerance Patient tolerated treatment well   Patient Left in chair;with call bell/phone within reach;with chair alarm set   Nurse Communication Mobility status;Need for lift equipment        Time: 1137-1203 OT Time Calculation (min): 26 min  Charges: OT General Charges $OT Visit: 1 Visit OT Treatments $Therapeutic Activity: 23-37 mins  Eber Jones., OTR/L Acute Rehabilitation Services Pager [phone] Office [phone]    Jeani Hawking M 04/22/2020, 2:17 PM

## 2020-04-22 NOTE — Plan of Care (Signed)
?  Problem: Education: ?Goal: Knowledge of General Education information will improve ?Description: Including pain rating scale, medication(s)/side effects and non-pharmacologic comfort measures ?Outcome: Progressing ?  ?Problem: Health Behavior/Discharge Planning: ?Goal: Ability to manage health-related needs will improve ?Outcome: Progressing ?  ?Problem: Clinical Measurements: ?Goal: Ability to maintain clinical measurements within normal limits will improve ?Outcome: Progressing ?Goal: Will remain free from infection ?Outcome: Progressing ?Goal: Diagnostic test results will improve ?Outcome: Progressing ?Goal: Respiratory complications will improve ?Outcome: Progressing ?Goal: Cardiovascular complication will be avoided ?Outcome: Progressing ?  ?Problem: Activity: ?Goal: Risk for activity intolerance will decrease ?Outcome: Progressing ?  ?Problem: Nutrition: ?Goal: Adequate nutrition will be maintained ?Outcome: Progressing ?  ?Problem: Coping: ?Goal: Level of anxiety will decrease ?Outcome: Progressing ?  ?Problem: Elimination: ?Goal: Will not experience complications related to bowel motility ?Outcome: Progressing ?Goal: Will not experience complications related to urinary retention ?Outcome: Progressing ?  ?Problem: Pain Managment: ?Goal: General experience of comfort will improve ?Outcome: Progressing ?  ?Problem: Safety: ?Goal: Ability to remain free from injury will improve ?Outcome: Progressing ?  ?Problem: Skin Integrity: ?Goal: Risk for impaired skin integrity will decrease ?Outcome: Progressing ?  ?Problem: Education: ?Goal: Knowledge of risk factors and measures for prevention of condition will improve ?Outcome: Progressing ?  ?

## 2020-04-23 ENCOUNTER — Inpatient Hospital Stay (HOSPITAL_COMMUNITY): Payer: Medicare PPO

## 2020-04-23 ENCOUNTER — Inpatient Hospital Stay: Payer: Self-pay

## 2020-04-23 DIAGNOSIS — F10230 Alcohol dependence with withdrawal, uncomplicated: Secondary | ICD-10-CM | POA: Diagnosis not present

## 2020-04-23 DIAGNOSIS — R7881 Bacteremia: Secondary | ICD-10-CM | POA: Diagnosis not present

## 2020-04-23 DIAGNOSIS — N400 Enlarged prostate without lower urinary tract symptoms: Secondary | ICD-10-CM | POA: Diagnosis not present

## 2020-04-23 DIAGNOSIS — N3000 Acute cystitis without hematuria: Secondary | ICD-10-CM | POA: Diagnosis not present

## 2020-04-23 LAB — GLUCOSE, CAPILLARY
Glucose-Capillary: 130 mg/dL — ABNORMAL HIGH (ref 70–99)
Glucose-Capillary: 114 mg/dL — ABNORMAL HIGH (ref 70–99)
Glucose-Capillary: 114 mg/dL — ABNORMAL HIGH (ref 70–99)
Glucose-Capillary: 119 mg/dL — ABNORMAL HIGH (ref 70–99)

## 2020-04-23 MED ORDER — SODIUM CHLORIDE 0.9% FLUSH
10.0000 mL | Freq: Two times a day (BID) | INTRAVENOUS | Status: DC
  Administered 2020-04-24: 10 mL

## 2020-04-23 MED ORDER — SODIUM CHLORIDE 0.9% FLUSH
10.0000 mL | INTRAVENOUS | Status: DC | PRN
Start: 2020-04-23 — End: 2020-04-24

## 2020-04-23 NOTE — Progress Notes (Signed)
Brief ID Progress Note:  Adam Conner is a 87 y.o. male with MRSA bacteremia complicated by Cardiac Device in situ. Have discussed with E/P team. He was quite unresponsive to verbal stimuli on exam for them yesterday. He is always awake and pleasantly confused when seen by our team and awake eating breakfast heartily. He has never been oriented correctly to place or time.   Not thought to be a device candidate. Appreciate EP/Cardiology help for outpatient follow up to help determine if this is something that can be considered at a later time if in the best interest for the patient. Not sure there is much to gain with a TEE for him given inability to remove device.   With recommendations to go to SNF, I do not feel enthusiastic about adding gentamicin for him due to concerns with overall safety to kidneys. Xarelto precludes addition of rifampin - will talk with ID team about potentially using rifabutin once daily instead which is less likely to impact anticoagulation.   Plan to continue medical therapy for treatment at this time, planned for 6 weeks of treatment.  He is cleared for PICC line placement with clearance of bacteremia > 72h.   Leukocytosis up-trending noted - will follow for any new findings or changes with the patient.    Rexene Alberts, MSN, NP-C [facility]  [email or web address] Pager: [phone] Office: [phone] [facility] Main Line: [phone]

## 2020-04-23 NOTE — TOC Progression Note (Signed)
Transition of Care [facility]) - Progression Note    Patient Details  Name: NATHANYL ANDUJO MRN: [id number] Date of Birth: 1933-11-12  [facility] CM/SW Contact  Mearl Latin, LCSW Phone Number: 04/23/2020, 2:03 PM  Clinical Narrative:    CSW spoke with patient's daughter and provided the only bed offer of [facility]. She is agreeable for patient to go there tomorrow if stable. [facility] is able to accept patient. CSW sent clinicals to [facility] (Ref# [id number]).   Expected Discharge Plan: Skilled Nursing Facility Barriers to Discharge: Continued Medical Work up  Expected Discharge Plan and Services Expected Discharge Plan: Skilled Nursing Facility   Discharge Planning Services: CM Consult   Living arrangements for the past 2 months: Single Family Home                                       Social Determinants of Health (SDOH) Interventions    Readmission Risk Interventions No flowsheet data found.

## 2020-04-23 NOTE — Progress Notes (Signed)
PROGRESS NOTE        PATIENT DETAILS Name: Adam Conner Age: 87 y.o. Sex: male Date of Birth: 11/18/1933 Admit Date: 04/16/2020 Admitting Physician Azucena Fallen, MD ZOX:[id number], No Pcp Per  Brief Narrative: Patient is a 87 y.o. male HLD, HTN, EtOH use, dementia, PAF on Xarelto, history of PPM placement presented to hospital with sepsis-further evaluation revealed MRSA bacteremia.  Significant events: 2/24>> admit for sepsis-found to have MRSA bacteremia  Significant studies: 2/24>> chest x-ray: Central venous congestion 2/25>> CT abdomen/pelvis with contrast: Cholelithiasis, infrarenal aortic aneurysm 2/25>> Echo: EF 60-65%  Antimicrobial therapy: Vancomycin: 2/25>>  Microbiology data: 2/24>> blood culture: MRSA 2/24>> urine culture: Proteus/E. Coli 2/27>> blood culture: No growth  Procedures : None  Consults: ID, cardiology  DVT Prophylaxis :  rivaroxaban (XARELTO) tablet 20 mg   Subjective: Lying comfortably in bed-no major issues overnight.  Slightly confused but able to answer most of my questions appropriately.  Assessment/Plan: Sepsis due to MRSA bacteremia: Sepsis physiology has resolved-however slight bump in leukocytosis today-repeat blood cultures negative so far.  Continue IV vancomycin-we will place PICC line today.  Evaluated by EP-recommendations are not to extract PPM-and to treat with suppressive antimicrobial therapy.  Acute metabolic encephalopathy: Secondary to above-suspect has some amount of dementia at baseline-he is able to follow simple commands today.  Asymptomatic bacteriuria: Does not require Abx treatment at this point  PAF: Paced rhythm on telemetry-on Xarelto.   History of PPM implantation: Unclear why this was implanted-Per EP recommendations are to pursue conservative management with suppressive antibiotic strategy rather than removing pacer.  HTN: BP stable-resume lisinopril when able.  BPH:  Continue Flomax  Dementia: Pleasantly confused-maintain delirium precautions  History of alcohol use: Mildly confused but suspect this is from dementia-no other clinical features suggestive of DTs-on Ativan per CIWA protocol.  Recent Covid infection-Home test positive on 1/19: Does not require any further isolation.  Covid PCR positive at admission-is a reflection of his recent positive test-CT value of 35 is reassuring of patient not being noninfectious.  Lab Results  Component Value Date   SARSCOV2NAA POSITIVE (A) 04/16/2020    Obesity: Estimated body mass index is 34.2 kg/m as calculated from the following:   Height as of this encounter:  (1.778 m).   Weight as of this encounter: 108.1 kg.    Diet: Diet Order            Diet heart healthy/carb modified Room service appropriate? Yes; Fluid consistency: Thin  Diet effective now                  Code Status: Full code   Family Communication: Daughter-Alida-[phone] over the phone on 3/3  Disposition Plan:  Status is: Inpatient  Remains inpatient appropriate because:Inpatient level of care appropriate due to severity of illness   Dispo: The patient is from: Home              Anticipated d/c is to: SNF              Patient currently is not medically stable to d/c.   Difficult to place patient No     Barriers to Discharge: MRSA bacteremia-on IV vancomycin-needs PICC line and SNF  Antimicrobial agents: Anti-infectives (From admission, onward)   Start     Dose/Rate Route Frequency Ordered Stop   04/18/20 1800  vancomycin (  VANCOREADY) IVPB 1500 mg/300 mL        1,500 mg 150 mL/hr over 120 Minutes Intravenous Every 24 hours 04/17/20 1720     04/18/20 1000  remdesivir 100 mg in sodium chloride 0.9 % 100 mL IVPB  Status:  Discontinued       "Followed by" Linked Group Details   100 mg 200 mL/hr over 30 Minutes Intravenous Daily 04/17/20 0054 04/19/20 0734   04/17/20 1815  vancomycin (VANCOREADY) IVPB 2000  mg/400 mL        2,000 mg 200 mL/hr over 120 Minutes Intravenous  Once 04/17/20 1720 04/18/20 0337   04/17/20 0100  remdesivir 200 mg in sodium chloride 0.9% 250 mL IVPB       "Followed by" Linked Group Details   200 mg 580 mL/hr over 30 Minutes Intravenous Once 04/17/20 0054 04/17/20 0220   04/16/20 2330  cefTRIAXone (ROCEPHIN) 1 g in sodium chloride 0.9 % 100 mL IVPB  Status:  Discontinued        1 g 200 mL/hr over 30 Minutes Intravenous Every 24 hours 04/16/20 2324 04/17/20 1714       Time spent: 25 minutes-Greater than 50% of this time was spent in counseling, explanation of diagnosis, planning of further management, and coordination of care.  MEDICATIONS: Scheduled Meds: . vitamin C  500 mg Oral Daily  . Chlorhexidine Gluconate Cloth  6 each Topical BID  . folic acid  1 mg Oral Daily  . insulin aspart  0-9 Units Subcutaneous TID AC & HS  . LORazepam  0-4 mg Oral Q8H  . multivitamin with minerals  1 tablet Oral Daily  . rivaroxaban  20 mg Oral QPM  . tamsulosin  0.4 mg Oral QPC breakfast  . thiamine  100 mg Oral Daily   Or  . thiamine  100 mg Intravenous Daily  . zinc sulfate  220 mg Oral Daily   Continuous Infusions: . vancomycin 1,500 mg (04/22/20 1659)   PRN Meds:.acetaminophen, albuterol, guaiFENesin-dextromethorphan, LORazepam **OR** LORazepam, ondansetron **OR** ondansetron (ZOFRAN) IV, polyethylene glycol   PHYSICAL EXAM: Vital signs: Vitals:   04/22/20 0557 04/22/20 1410 04/22/20 2048 04/23/20 0448  BP: 136/75 138/72 132/61 (!) 128/59  Pulse: 70 69 60 65  Resp: 15 18 20 19   Temp: 98.5 F (36.9 C) 98.5 F (36.9 C) 97.8 F (36.6 C) 98.1 F (36.7 C)  TempSrc: Axillary Oral Oral Oral  SpO2: 94% 99% 100% 99%  Weight:      Height:       Filed Weights   04/16/20 2056 04/17/20 0422  Weight: 110 kg 108.1 kg   Body mass index is 34.2 kg/m.   Gen Exam:Alert awake-not in any distress HEENT:atraumatic, normocephalic Chest: B/L clear to auscultation  anteriorly CVS:S1S2 regular Abdomen:soft non tender, non distended Extremities:no edema Neurology: Non focal Skin: no rash  I have personally reviewed following labs and imaging studies  LABORATORY DATA: CBC: Recent Labs  Lab 04/18/20 0059 04/19/20 0321 04/20/20 0104 04/20/20 1610 04/20/20 2156 04/22/20 0156  WBC 20.8* 14.1* 11.1* 11.3* 11.8* 17.2*  NEUTROABS 16.0* 10.3* 7.7  --  8.2* 13.3*  HGB 12.6* 13.0 12.9* 12.7* 12.7* 13.1  HCT 37.2* 40.0 39.9 38.6* 38.8* 40.5  MCV 87.7 89.3 90.3 88.9 88.0 89.8  PLT 220 235 249 247 236 285    Basic Metabolic Panel: Recent Labs  Lab 04/18/20 0059 04/19/20 0321 04/20/20 0104 04/20/20 1610 04/20/20 2156 04/22/20 0156  NA 136 135 136 136 134* 137  K 2.9* 3.2*  3.6 3.6 3.6 3.9  CL 101 100 99 99 100 103  CO2 24 25 26 27 24 23   GLUCOSE 114* 122* 90 114* 116* 125*  BUN 23 25* 20 19 19 19   CREATININE 1.17 0.97 0.92 1.17 1.05 0.82  CALCIUM 8.5* 8.8* 8.8* 8.6* 8.5* 9.0  MG 1.7  --   --  1.6*  --   --   PHOS  --   --   --  4.1  --   --     GFR: Estimated Creatinine Clearance: 82.5 mL/min (by C-G formula based on SCr of 0.82 mg/dL).  Liver Function Tests: Recent Labs  Lab 04/19/20 0321 04/20/20 0104 04/20/20 1610 04/20/20 2156 04/22/20 0156  AST 37 37 31 33 26  ALT 27 31 29 28 27   ALKPHOS 66 63 64 61 67  BILITOT 1.2 1.1 0.9 1.2 1.3*  PROT 6.3* 6.3* 5.8* 5.8* 6.1*  ALBUMIN 2.6* 2.6* 2.5* 2.5* 2.5*   No results for input(s): LIPASE, AMYLASE in the last 168 hours. No results for input(s): AMMONIA in the last 168 hours.  Coagulation Profile: Recent Labs  Lab 04/16/20 2129  INR 1.5*    Cardiac Enzymes: No results for input(s): CKTOTAL, CKMB, CKMBINDEX, TROPONINI in the last 168 hours.  BNP (last 3 results) No results for input(s): PROBNP in the last 8760 hours.  Lipid Profile: No results for input(s): CHOL, HDL, LDLCALC, TRIG, CHOLHDL, LDLDIRECT in the last 72 hours.  Thyroid Function Tests: No results for  input(s): TSH, T4TOTAL, FREET4, T3FREE, THYROIDAB in the last 72 hours.  Anemia Panel: No results for input(s): VITAMINB12, FOLATE, FERRITIN, TIBC, IRON, RETICCTPCT in the last 72 hours.  Urine analysis:    Component Value Date/Time   COLORURINE AMBER (A) 04/16/2020 2230   APPEARANCEUR CLOUDY (A) 04/16/2020 2230   LABSPEC 1.017 04/16/2020 2230   PHURINE 8.0 04/16/2020 2230   GLUCOSEU NEGATIVE 04/16/2020 2230   HGBUR SMALL (A) 04/16/2020 2230   BILIRUBINUR NEGATIVE 04/16/2020 2230   KETONESUR NEGATIVE 04/16/2020 2230   PROTEINUR 30 (A) 04/16/2020 2230   UROBILINOGEN 4.0 (H) 12/15/2008 0600   NITRITE NEGATIVE 04/16/2020 2230   LEUKOCYTESUR LARGE (A) 04/16/2020 2230    Sepsis Labs: Lactic Acid, Venous    Component Value Date/Time   LATICACIDVEN 1.8 04/16/2020 2317    MICROBIOLOGY: Recent Results (from the past 240 hour(s))  Blood culture (routine single)     Status: Abnormal   Collection Time: 04/16/20  9:29 PM   Specimen: BLOOD  Result Value Ref Range Status   Specimen Description BLOOD LEFT ANTECUBITAL  Final   Special Requests   Final    BOTTLES DRAWN AEROBIC AND ANAEROBIC Blood Culture adequate volume   Culture  Setup Time   Final    GRAM POSITIVE COCCI IN CLUSTERS IN BOTH AEROBIC AND ANAEROBIC BOTTLES CRITICAL RESULT CALLED TO, READ BACK BY AND VERIFIED WITH02/26/2022 PHARMD 04/18/2020 04/17/20 A BROWNING Performed at [facility], [address] [place], [facility] [address]    Culture METHICILLIN RESISTANT STAPHYLOCOCCUS AUREUS (A)  Final   Report Status 04/19/2020 FINAL  Final   Organism ID, Bacteria METHICILLIN RESISTANT STAPHYLOCOCCUS AUREUS  Final      Susceptibility   Methicillin resistant staphylococcus aureus - MIC*    CIPROFLOXACIN >=8 RESISTANT Resistant     ERYTHROMYCIN >=8 RESISTANT Resistant     GENTAMICIN <=0.5 SENSITIVE Sensitive     OXACILLIN >=4 RESISTANT Resistant     TETRACYCLINE <=1 SENSITIVE Sensitive  VANCOMYCIN 1 SENSITIVE Sensitive      TRIMETH/SULFA <=10 SENSITIVE Sensitive     CLINDAMYCIN >=8 RESISTANT Resistant     RIFAMPIN <=0.5 SENSITIVE Sensitive     Inducible Clindamycin NEGATIVE Sensitive     * METHICILLIN RESISTANT STAPHYLOCOCCUS AUREUS  Blood Culture ID Panel (Reflexed)     Status: Abnormal   Collection Time: 04/16/20  9:29 PM  Result Value Ref Range Status   Enterococcus faecalis NOT DETECTED NOT DETECTED Final   Enterococcus Faecium NOT DETECTED NOT DETECTED Final   Listeria monocytogenes NOT DETECTED NOT DETECTED Final   Staphylococcus species DETECTED (A) NOT DETECTED Final    Comment: CRITICAL RESULT CALLED TO, READ BACK BY AND VERIFIED WITHArnaldo Natal PHARMD 1645 04/17/20 A BROWNING    Staphylococcus aureus (BCID) DETECTED (A) NOT DETECTED Final    Comment: Methicillin (oxacillin)-resistant Staphylococcus aureus (MRSA). MRSA is predictably resistant to beta-lactam antibiotics (except ceftaroline). Preferred therapy is vancomycin unless clinically contraindicated. Patient requires contact precautions if  hospitalized. CRITICAL RESULT CALLED TO, READ BACK BY AND VERIFIED WITH: Arnaldo Natal PHARMD 1610 04/17/20 A BROWNING    Staphylococcus epidermidis NOT DETECTED NOT DETECTED Final   Staphylococcus lugdunensis NOT DETECTED NOT DETECTED Final   Streptococcus species NOT DETECTED NOT DETECTED Final   Streptococcus agalactiae NOT DETECTED NOT DETECTED Final   Streptococcus pneumoniae NOT DETECTED NOT DETECTED Final   Streptococcus pyogenes NOT DETECTED NOT DETECTED Final   A.calcoaceticus-baumannii NOT DETECTED NOT DETECTED Final   Bacteroides fragilis NOT DETECTED NOT DETECTED Final   Enterobacterales NOT DETECTED NOT DETECTED Final   Enterobacter cloacae complex NOT DETECTED NOT DETECTED Final   Escherichia coli NOT DETECTED NOT DETECTED Final   Klebsiella aerogenes NOT DETECTED NOT DETECTED Final   Klebsiella oxytoca NOT DETECTED NOT DETECTED Final   Klebsiella pneumoniae NOT DETECTED NOT DETECTED Final    Proteus species NOT DETECTED NOT DETECTED Final   Salmonella species NOT DETECTED NOT DETECTED Final   Serratia marcescens NOT DETECTED NOT DETECTED Final   Haemophilus influenzae NOT DETECTED NOT DETECTED Final   Neisseria meningitidis NOT DETECTED NOT DETECTED Final   Pseudomonas aeruginosa NOT DETECTED NOT DETECTED Final   Stenotrophomonas maltophilia NOT DETECTED NOT DETECTED Final   Candida albicans NOT DETECTED NOT DETECTED Final   Candida auris NOT DETECTED NOT DETECTED Final   Candida glabrata NOT DETECTED NOT DETECTED Final   Candida krusei NOT DETECTED NOT DETECTED Final   Candida parapsilosis NOT DETECTED NOT DETECTED Final   Candida tropicalis NOT DETECTED NOT DETECTED Final   Cryptococcus neoformans/gattii NOT DETECTED NOT DETECTED Final   Meth resistant mecA/C and MREJ DETECTED (A) NOT DETECTED Final    Comment: CRITICAL RESULT CALLED TO, READ BACK BY AND VERIFIED WITHArnaldo Natal PHARMD 1645 04/17/20 A BROWNING Performed at [facility], [address] [place], [place] [address]   Urine culture     Status: Abnormal   Collection Time: 04/16/20 10:30 PM   Specimen: In/Out Cath Urine  Result Value Ref Range Status   Specimen Description IN/OUT CATH URINE  Final   Special Requests   Final    NONE Performed at [facility], [address] [place], [place] [address]    Culture (A)  Final    >=100,000 COLONIES/mL PROTEUS MIRABILIS >=100,000 COLONIES/mL ESCHERICHIA COLI    Report Status 04/20/2020 FINAL  Final   Organism ID, Bacteria PROTEUS MIRABILIS (A)  Final   Organism ID, Bacteria ESCHERICHIA COLI (A)  Final  Susceptibility   Escherichia coli - MIC*    AMPICILLIN >=32 RESISTANT Resistant     CEFAZOLIN 16 SENSITIVE Sensitive     CEFEPIME <=0.12 SENSITIVE Sensitive     CEFTRIAXONE <=0.25 SENSITIVE Sensitive     CIPROFLOXACIN 1 SENSITIVE Sensitive     GENTAMICIN <=1 SENSITIVE Sensitive     IMIPENEM <=0.25 SENSITIVE Sensitive     NITROFURANTOIN <=16  SENSITIVE Sensitive     TRIMETH/SULFA >=320 RESISTANT Resistant     AMPICILLIN/SULBACTAM >=32 RESISTANT Resistant     PIP/TAZO 8 SENSITIVE Sensitive     * >=100,000 COLONIES/mL ESCHERICHIA COLI   Proteus mirabilis - MIC*    AMPICILLIN <=2 SENSITIVE Sensitive     CEFAZOLIN <=4 SENSITIVE Sensitive     CEFEPIME <=0.12 SENSITIVE Sensitive     CEFTRIAXONE <=0.25 SENSITIVE Sensitive     CIPROFLOXACIN <=0.25 SENSITIVE Sensitive     GENTAMICIN <=1 SENSITIVE Sensitive     IMIPENEM 2 SENSITIVE Sensitive     NITROFURANTOIN 128 RESISTANT Resistant     TRIMETH/SULFA <=20 SENSITIVE Sensitive     AMPICILLIN/SULBACTAM <=2 SENSITIVE Sensitive     PIP/TAZO <=4 SENSITIVE Sensitive     * >=100,000 COLONIES/mL PROTEUS MIRABILIS  Resp Panel by RT-PCR (Flu A&B, Covid) Nasopharyngeal Swab     Status: Abnormal   Collection Time: 04/16/20 10:35 PM   Specimen: Nasopharyngeal Swab; Nasopharyngeal(NP) swabs in vial transport medium  Result Value Ref Range Status   SARS Coronavirus 2 by RT PCR POSITIVE (A) NEGATIVE Final    Comment: RESULT CALLED TO, READ BACK BY AND VERIFIED WITH: B SANGALANG RN 04/17/20  0022 JDW (NOTE) SARS-CoV-2 target nucleic acids are DETECTED.  The SARS-CoV-2 RNA is generally detectable in upper respiratory specimens during the acute phase of infection. Positive results are indicative of the presence of the identified virus, but do not rule out bacterial infection or co-infection with other pathogens not detected by the test. Clinical correlation with patient history and other diagnostic information is necessary to determine patient infection status. The expected result is Negative.  Fact Sheet for Patients: BloggerCourse.comhttps://www.fda.gov/media/152166/download  Fact Sheet for Healthcare Providers: SeriousBroker.ithttps://www.fda.gov/media/152162/download  This test is not yet approved or cleared by the [place] FDA and  has been authorized for detection and/or diagnosis of SARS-CoV-2 by FDA under an  Emergency Use Authorization (EUA).  This EUA will remain in effect (meaning this test can be  used) for the duration of  the COVID-19 declaration under Section 564(b)(1) of the Act, 21 U.S.C. section 360bbb-3(b)(1), unless the authorization is terminated or revoked sooner.     Influenza A by PCR NEGATIVE NEGATIVE Final   Influenza B by PCR NEGATIVE NEGATIVE Final    Comment: (NOTE) The Xpert Xpress SARS-CoV-2/FLU/RSV plus assay is intended as an aid in the diagnosis of influenza from Nasopharyngeal swab specimens and should not be used as a sole basis for treatment. Nasal washings and aspirates are unacceptable for Xpert Xpress SARS-CoV-2/FLU/RSV testing.  Fact Sheet for Patients: BloggerCourse.comhttps://www.fda.gov/media/152166/download  Fact Sheet for Healthcare Providers: SeriousBroker.ithttps://www.fda.gov/media/152162/download  This test is not yet approved or cleared by the [place] FDA and has been authorized for detection and/or diagnosis of SARS-CoV-2 by FDA under an Emergency Use Authorization (EUA). This EUA will remain in effect (meaning this test can be used) for the duration of the COVID-19 declaration under Section 564(b)(1) of the Act, 21 U.S.C. section 360bbb-3(b)(1), unless the authorization is terminated or revoked.  Performed at [facility], [address] [place], [place] [id number]  Culture, blood (Routine X 2) w Reflex to ID Panel     Status: None (Preliminary result)   Collection Time: 04/19/20  3:26 AM   Specimen: BLOOD  Result Value Ref Range Status   Specimen Description BLOOD RIGHT ANTECUBITAL  Final   Special Requests   Final    BOTTLES DRAWN AEROBIC AND ANAEROBIC Blood Culture adequate volume   Culture   Final    NO GROWTH 3 DAYS Performed at [facility], [address] [place], [place] [address]    Report Status PENDING  Incomplete  Culture, blood (Routine X 2) w Reflex to ID Panel     Status: None (Preliminary result)   Collection Time: 04/19/20   3:31 AM   Specimen: BLOOD LEFT HAND  Result Value Ref Range Status   Specimen Description BLOOD LEFT HAND  Final   Special Requests   Final    BOTTLES DRAWN AEROBIC AND ANAEROBIC Blood Culture adequate volume   Culture   Final    NO GROWTH 3 DAYS Performed at [facility], [address], [place], [place] [address]    Report Status PENDING  Incomplete    RADIOLOGY STUDIES/RESULTS: No results found.   LOS: 6 days   Jeoffrey Massed, MD  [facility]    To contact the attending provider between 7A-7P or the covering provider during after hours 7P-7A, please log into the web site [email or web address] and access using universal [place] password for that web site. If you do not have the password, please call the hospital operator.  04/23/2020, 12:26 PM

## 2020-04-23 NOTE — Progress Notes (Addendum)
Physical Therapy Treatment Patient Details Name: Adam Conner MRN: [id number] DOB: 12-11-1933 Today's Date: 04/23/2020    History of Present Illness Pt is an 87 y.o. male admitted 04/16/20 with progressive confusion; workup for sepsis. Further evaluation revealed MRSA bacteremia. Incidental (+) COVID-19. PMH includes dementia, HTN, pacemaker.   PT Comments    Pt with increased lethargy this session, keeping eyes closed majority of the time with inconsistent command following. Pt unaware of urine incontinence. Pt requires modA+1 to totalA for bed-level mobility; dependent for pericare, dressing and linen change; pt able to participate minimally with max, multimodal cues. VSS on RA. Continue to recommend SNF-level therapies to maximize functional mobility and decrease caregiver burden; pt was ambulating with PT and minA a few days ago. If pt to return home, will likely require DME listed below, including a mechanical lift for OOB transfers.    Follow Up Recommendations  SNF;Supervision/Assistance - 24 hour     Equipment Recommendations  Wheelchair (measurements PT);Wheelchair cushion (measurements PT);Hospital bed;3in1;Hoyer lift    Recommendations for Other Services       Precautions / Restrictions Precautions Precautions: Fall;Other (comment) Precaution Comments: Bladder/bowel incontinence Restrictions Weight Bearing Restrictions: No    Mobility  Bed Mobility Overal bed mobility: Needs Assistance Bed Mobility: Rolling Rolling: Mod assist;Max assist;+2 for physical assistance;+2 for safety/equipment         General bed mobility comments: Pt received partially rolled onto R-side with urine incontinence; pt required max cues to attend to therapist and participate in bed-level activity for washup and linen change; with verbal/tactile cues, pt able to roll to R-side with use of bed rail and hold position well; maxA+2 to roll onto L-side (question increased fatigue?); totalA to scoot  up in bed; repositioned onto L-side partial rolling for pressure relief with heels floated    Transfers                 General transfer comment: Deferred secondary to lethargy and not following commands consistently, decreased participation  Ambulation/Gait                 Stairs             Wheelchair Mobility    Modified Rankin (Stroke Patients Only)       Balance                                            Cognition Arousal/Alertness: Lethargic Behavior During Therapy: Flat affect Overall Cognitive Status: No family/caregiver present to determine baseline cognitive functioning Area of Impairment: Orientation;Attention;Following commands;Safety/judgement;Memory;Awareness;Problem solving                   Current Attention Level: Focused   Following Commands: Follows one step commands inconsistently Safety/Judgement: Decreased awareness of safety;Decreased awareness of deficits Awareness: Intellectual Problem Solving: Slow processing;Difficulty sequencing;Requires verbal cues;Decreased initiation;Requires tactile cues General Comments: Pt keeping eyes closed majority of session; responds to name, only verbalizing a few yes/no responses; follows some simple commands with delay and cues, not consistently      Exercises      General Comments General comments (skin integrity, edema, etc.): SpO2 96-99% on RA, HR 60, resting BP 132/53. Played music which pt did not seem to respond to      Pertinent Vitals/Pain Pain Assessment: No/denies pain Pain Intervention(s): Monitored during session    Home Living  Prior Function            PT Goals (current goals can now be found in the care plan section) Progress towards PT goals: Not progressing toward goals - comment (lethargy, decreased participation)    Frequency    Min 2X/week      PT Plan Current plan remains appropriate     Co-evaluation              AM-PAC PT "6 Clicks" Mobility   Outcome Measure  Help needed turning from your back to your side while in a flat bed without using bedrails?: A Lot Help needed moving from lying on your back to sitting on the side of a flat bed without using bedrails?: A Lot Help needed moving to and from a bed to a chair (including a wheelchair)?: Total Help needed standing up from a chair using your arms (e.g., wheelchair or bedside chair)?: Total Help needed to walk in hospital room?: Total Help needed climbing 3-5 steps with a railing? : Total 6 Click Score: 8    End of Session   Activity Tolerance: Patient limited by lethargy Patient left: in bed;with call bell/phone within reach;with bed alarm set Nurse Communication: Mobility status PT Visit Diagnosis: Unsteadiness on feet (R26.81);Difficulty in walking, not elsewhere classified (R26.2)     Time: 2595-6387 PT Time Calculation (min) (ACUTE ONLY): 28 min  Charges:  $Therapeutic Activity: 23-37 mins                     Adam Conner, PT, DPT Acute Rehabilitation Services  Pager [phone] Office [phone]  Adam Conner 04/23/2020, 9:57 AM

## 2020-04-23 NOTE — Progress Notes (Signed)
Peripherally Inserted Central Catheter Placement  The IV Nurse has discussed with the patient and/or persons authorized to consent for the patient, the purpose of this procedure and the potential benefits and risks involved with this procedure.  The benefits include less needle sticks, lab draws from the catheter, and the patient may be discharged home with the catheter. Risks include, but not limited to, infection, bleeding, blood clot (thrombus formation), and puncture of an artery; nerve damage and irregular heartbeat and possibility to perform a PICC exchange if needed/ordered by physician.  Alternatives to this procedure were also discussed.  Bard Power PICC patient education guide, fact sheet on infection prevention and patient information card has been provided to patient /or left at bedside.   Consent obtained via phone with daughter  PICC Placement Documentation  PICC Single Lumen 04/23/20 PICC Right Basilic 41 cm 0 cm (Active)  Indication for Insertion or Continuance of Line Home intravenous therapies (PICC only) 04/22/20 2000  Exposed Catheter (cm) 0 cm 04/22/20 2000  Site Assessment Clean;Dry;Intact 04/22/20 2000  Line Status Flushed;Saline locked;Blood return noted 04/22/20 2000  Dressing Type Transparent;Securing device 04/22/20 2000  Dressing Status Clean;Dry;Intact 04/22/20 2000  Antimicrobial disc in place? Yes 04/22/20 2000  Safety Lock Not Applicable 04/22/20 2000  Line Care Connections checked and tightened 04/22/20 2000  Line Adjustment (NICU/IV Team Only) No 04/22/20 2000  Dressing Intervention New dressing 04/22/20 2000  Dressing Change Due 04/30/20 04/22/20 2000       Franne Grip Renee 04/23/2020, 5:39 PM

## 2020-04-24 DIAGNOSIS — R7881 Bacteremia: Secondary | ICD-10-CM | POA: Diagnosis not present

## 2020-04-24 DIAGNOSIS — U071 COVID-19: Secondary | ICD-10-CM | POA: Diagnosis not present

## 2020-04-24 DIAGNOSIS — N400 Enlarged prostate without lower urinary tract symptoms: Secondary | ICD-10-CM | POA: Diagnosis not present

## 2020-04-24 DIAGNOSIS — I10 Essential (primary) hypertension: Secondary | ICD-10-CM | POA: Diagnosis not present

## 2020-04-24 DIAGNOSIS — L97522 Non-pressure chronic ulcer of other part of left foot with fat layer exposed: Secondary | ICD-10-CM

## 2020-04-24 DIAGNOSIS — F10230 Alcohol dependence with withdrawal, uncomplicated: Secondary | ICD-10-CM | POA: Diagnosis not present

## 2020-04-24 DIAGNOSIS — B9562 Methicillin resistant Staphylococcus aureus infection as the cause of diseases classified elsewhere: Secondary | ICD-10-CM | POA: Diagnosis not present

## 2020-04-24 LAB — CULTURE, BLOOD (ROUTINE X 2)
Culture: NO GROWTH
Culture: NO GROWTH
Special Requests: ADEQUATE
Special Requests: ADEQUATE

## 2020-04-24 LAB — GLUCOSE, CAPILLARY
Glucose-Capillary: 122 mg/dL — ABNORMAL HIGH (ref 70–99)
Glucose-Capillary: 110 mg/dL — ABNORMAL HIGH (ref 70–99)

## 2020-04-24 MED ORDER — LISINOPRIL 40 MG PO TABS: 20.0000 mg | ORAL_TABLET | Freq: Every evening | ORAL | Status: DC

## 2020-04-24 MED ORDER — VANCOMYCIN HCL 1500 MG/300ML IV SOLN: 1500.0000 mg | INTRAVENOUS | Status: DC

## 2020-04-24 NOTE — Progress Notes (Signed)
[facility] for Infectious Disease  Date of Admission:  04/16/2020      Total days of antibiotics 7   Vancomycin            ASSESSMENT: Adam Conner is a 87 y.o. male with dementia admitted with MRSA bacteremia from unidentified source. Complicated by pacemaker in situ and presumably source of current infection. L foot ulcer that is open but clean and not acutely infected likely original entry to blood stream. Unfortunately he is not a candidate for device extraction (dementia, device has been in > 10 years so risky extraction). Not a candidate for rifampin due to drug interactions and do not feel comfortable adding gentamicin given SNF setting and 2 nephrotoxic agents. Will treat x 6 weeks with IV vancomycin and likely place on lifelong doxycycline should he tolerate that OK. FU with ID on 3/15 @ 10:30 am arranged with me.   Asymptomatic bacteriuria with proteus and e coli. No urinary symptoms. He overall seems improved and at baseline without this being treated.   COVID-19 infection - fully recovered, done with isolation     PLAN: OPAT ORDERS:  Diagnosis: PM infection (presumed)  Culture Result: MRSA (R clinda, cipro)   No Known Allergies   Discharge antibiotics to be given via PICC line:  Per pharmacy protocol VANCOMYCIN  *goal trough 15-20 or AUC 400-550   Duration: 6 weeks   End Date: 05/31/2020  [facility] Care Per Protocol with [facility] Use: Home health RN for IV administration and teaching, line care and labs.    Labs weekly while on IV antibiotics: _x_ CBC with differential _x_ BMP TWICE WEEKLY ** __ CMP __ CRP __ ESR _x_ Vancomycin trough __ CK  _x_ Please pull PIC at completion of IV antibiotics __ Please leave PIC in place until doctor has seen patient or been notified  Fax weekly labs to [phone]  Clinic Follow Up Appt: [place] 3/15 @ 10:30 am      Principal Problem:   MRSA bacteremia Active Problems:   Sepsis ([place])    Lactic acidosis   Essential hypertension   Benign prostatic hyperplasia   Dementia without behavioral disturbance (HCC)   Presence of cardiac pacemaker   COVID-19 virus infection   Hyperglycemia   Acute cystitis without hematuria   Alcohol dependence with uncomplicated withdrawal ([place])   Pacemaker infection ([place])   Diabetic foot ulcer ([place])   . vitamin C  500 mg Oral Daily  . Chlorhexidine Gluconate Cloth  6 each Topical BID  . folic acid  1 mg Oral Daily  . insulin aspart  0-9 Units Subcutaneous TID AC & HS  . LORazepam  0-4 mg Oral Q8H  . multivitamin with minerals  1 tablet Oral Daily  . rivaroxaban  20 mg Oral QPM  . sodium chloride flush  10-40 mL Intracatheter Q12H  . tamsulosin  0.4 mg Oral QPC breakfast  . thiamine  100 mg Oral Daily   Or  . thiamine  100 mg Intravenous Daily  . zinc sulfate  220 mg Oral Daily    SUBJECTIVE: Feels OK - eating breakfast and likes his french toast. Thinks he is at home and I am someone who lives with him.    Review of Systems: Review of Systems  Constitutional: Negative for chills and fever.  HENT: Negative for tinnitus.   Eyes: Negative for blurred vision and photophobia.  Respiratory: Negative for cough and sputum production.   Cardiovascular:  Negative for chest pain.  Gastrointestinal: Negative for diarrhea, nausea and vomiting.  Genitourinary: Negative for dysuria.  Skin: Negative for rash.  Neurological: Negative for headaches.    No Known Allergies  OBJECTIVE: Vitals:   04/23/20 0448 04/23/20 1245 04/23/20 2044 04/24/20 0437  BP: (!) 128/59 130/71 140/62 (!) 155/66  Pulse: 65 61 60 62  Resp: 19 19 (!) 21 18  Temp: 98.1 F (36.7 C) 97.9 F (36.6 C) 97.7 F (36.5 C) 97.7 F (36.5 C)  TempSrc: Oral Oral Oral Oral  SpO2: 99% 100% 100% 97%  Weight:      Height:       Body mass index is 34.2 kg/m.  Physical Exam Vitals reviewed.  Constitutional:      General: He is not in acute distress.    Appearance:  Normal appearance. He is ill-appearing.  HENT:     Mouth/Throat:     Mouth: Mucous membranes are moist.     Pharynx: Oropharynx is clear.  Eyes:     General: No scleral icterus.    Pupils: Pupils are equal, round, and reactive to light.  Cardiovascular:     Rate and Rhythm: Normal rate and regular rhythm.     Heart sounds: No murmur heard.   Pulmonary:     Effort: Pulmonary effort is normal.     Breath sounds: Normal breath sounds. No stridor.  Chest:     Comments: PPM generator site unremarkable  Abdominal:     General: Bowel sounds are normal.     Palpations: Abdomen is soft.  Musculoskeletal:        General: Normal range of motion.     Comments: L great toe with plantar ulceration. Small central opening does not appear to probe to bone. No erythema. Callous formed around it. No drainage.   Skin:    General: Skin is warm and dry.     Capillary Refill: Capillary refill takes less than 2 seconds.     Findings: No rash.  Neurological:     Mental Status: He is alert and oriented to person, place, and time.  Psychiatric:        Mood and Affect: Mood normal.        Behavior: Behavior normal.     Lab Results Lab Results  Component Value Date   WBC 17.2 (H) 04/22/2020   HGB 13.1 04/22/2020   HCT 40.5 04/22/2020   MCV 89.8 04/22/2020   PLT 285 04/22/2020    Lab Results  Component Value Date   CREATININE 0.82 04/22/2020   BUN 19 04/22/2020   NA 137 04/22/2020   K 3.9 04/22/2020   CL 103 04/22/2020   CO2 23 04/22/2020    Lab Results  Component Value Date   ALT 27 04/22/2020   AST 26 04/22/2020   ALKPHOS 67 04/22/2020   BILITOT 1.3 (H) 04/22/2020     Microbiology: Recent Results (from the past 240 hour(s))  Blood culture (routine single)     Status: Abnormal   Collection Time: 04/16/20  9:29 PM   Specimen: BLOOD  Result Value Ref Range Status   Specimen Description BLOOD LEFT ANTECUBITAL  Final   Special Requests   Final    BOTTLES DRAWN AEROBIC AND  ANAEROBIC Blood Culture adequate volume   Culture  Setup Time   Final    GRAM POSITIVE COCCI IN CLUSTERS IN BOTH AEROBIC AND ANAEROBIC BOTTLES CRITICAL RESULT CALLED TO, READ BACK BY AND VERIFIED WITHHyman Bible PHARMD 1645 04/17/20  A BROWNING Performed at [facility], [place] [address] [place], [place] [address]    Culture METHICILLIN RESISTANT STAPHYLOCOCCUS AUREUS (A)  Final   Report Status 04/19/2020 FINAL  Final   Organism ID, Bacteria METHICILLIN RESISTANT STAPHYLOCOCCUS AUREUS  Final      Susceptibility   Methicillin resistant staphylococcus aureus - MIC*    CIPROFLOXACIN >=8 RESISTANT Resistant     ERYTHROMYCIN >=8 RESISTANT Resistant     GENTAMICIN <=0.5 SENSITIVE Sensitive     OXACILLIN >=4 RESISTANT Resistant     TETRACYCLINE <=1 SENSITIVE Sensitive     VANCOMYCIN 1 SENSITIVE Sensitive     TRIMETH/SULFA <=10 SENSITIVE Sensitive     CLINDAMYCIN >=8 RESISTANT Resistant     RIFAMPIN <=0.5 SENSITIVE Sensitive     Inducible Clindamycin NEGATIVE Sensitive     * METHICILLIN RESISTANT STAPHYLOCOCCUS AUREUS  Blood Culture ID Panel (Reflexed)     Status: Abnormal   Collection Time: 04/16/20  9:29 PM  Result Value Ref Range Status   Enterococcus faecalis NOT DETECTED NOT DETECTED Final   Enterococcus Faecium NOT DETECTED NOT DETECTED Final   Listeria monocytogenes NOT DETECTED NOT DETECTED Final   Staphylococcus species DETECTED (A) NOT DETECTED Final    Comment: CRITICAL RESULT CALLED TO, READ BACK BY AND VERIFIED WITHHyman Bible PHARMD 1645 04/17/20 A BROWNING    Staphylococcus aureus (BCID) DETECTED (A) NOT DETECTED Final    Comment: Methicillin (oxacillin)-resistant Staphylococcus aureus (MRSA). MRSA is predictably resistant to beta-lactam antibiotics (except ceftaroline). Preferred therapy is vancomycin unless clinically contraindicated. Patient requires contact precautions if  hospitalized. CRITICAL RESULT CALLED TO, READ BACK BY AND VERIFIED WITH: Hyman Bible PHARMD [id number]  04/17/20 A BROWNING    Staphylococcus epidermidis NOT DETECTED NOT DETECTED Final   Staphylococcus lugdunensis NOT DETECTED NOT DETECTED Final   Streptococcus species NOT DETECTED NOT DETECTED Final   Streptococcus agalactiae NOT DETECTED NOT DETECTED Final   Streptococcus pneumoniae NOT DETECTED NOT DETECTED Final   Streptococcus pyogenes NOT DETECTED NOT DETECTED Final   A.calcoaceticus-baumannii NOT DETECTED NOT DETECTED Final   Bacteroides fragilis NOT DETECTED NOT DETECTED Final   Enterobacterales NOT DETECTED NOT DETECTED Final   Enterobacter cloacae complex NOT DETECTED NOT DETECTED Final   Escherichia coli NOT DETECTED NOT DETECTED Final   Klebsiella aerogenes NOT DETECTED NOT DETECTED Final   Klebsiella oxytoca NOT DETECTED NOT DETECTED Final   Klebsiella pneumoniae NOT DETECTED NOT DETECTED Final   Proteus species NOT DETECTED NOT DETECTED Final   Salmonella species NOT DETECTED NOT DETECTED Final   Serratia marcescens NOT DETECTED NOT DETECTED Final   Haemophilus influenzae NOT DETECTED NOT DETECTED Final   Neisseria meningitidis NOT DETECTED NOT DETECTED Final   Pseudomonas aeruginosa NOT DETECTED NOT DETECTED Final   Stenotrophomonas maltophilia NOT DETECTED NOT DETECTED Final   Candida albicans NOT DETECTED NOT DETECTED Final   Candida auris NOT DETECTED NOT DETECTED Final   Candida glabrata NOT DETECTED NOT DETECTED Final   Candida krusei NOT DETECTED NOT DETECTED Final   Candida parapsilosis NOT DETECTED NOT DETECTED Final   Candida tropicalis NOT DETECTED NOT DETECTED Final   Cryptococcus neoformans/gattii NOT DETECTED NOT DETECTED Final   Meth resistant mecA/C and MREJ DETECTED (A) NOT DETECTED Final    Comment: CRITICAL RESULT CALLED TO, READ BACK BY AND VERIFIED WITHHyman Bible PHARMD 1645 04/17/20 A BROWNING Performed at [facility], [address] [place], [place] [address]   Urine culture     Status: Abnormal  Collection Time: 04/16/20 10:30 PM    Specimen: In/Out Cath Urine  Result Value Ref Range Status   Specimen Description IN/OUT CATH URINE  Final   Special Requests   Final    NONE Performed at [facility], [place] [address], [place], [place] [address]    Culture (A)  Final    >=100,000 COLONIES/mL PROTEUS MIRABILIS >=100,000 COLONIES/mL ESCHERICHIA COLI    Report Status 04/20/2020 FINAL  Final   Organism ID, Bacteria PROTEUS MIRABILIS (A)  Final   Organism ID, Bacteria ESCHERICHIA COLI (A)  Final      Susceptibility   Escherichia coli - MIC*    AMPICILLIN >=32 RESISTANT Resistant     CEFAZOLIN 16 SENSITIVE Sensitive     CEFEPIME <=0.12 SENSITIVE Sensitive     CEFTRIAXONE <=0.25 SENSITIVE Sensitive     CIPROFLOXACIN 1 SENSITIVE Sensitive     GENTAMICIN <=1 SENSITIVE Sensitive     IMIPENEM <=0.25 SENSITIVE Sensitive     NITROFURANTOIN <=16 SENSITIVE Sensitive     TRIMETH/SULFA >=320 RESISTANT Resistant     AMPICILLIN/SULBACTAM >=32 RESISTANT Resistant     PIP/TAZO 8 SENSITIVE Sensitive     * >=100,000 COLONIES/mL ESCHERICHIA COLI   Proteus mirabilis - MIC*    AMPICILLIN <=2 SENSITIVE Sensitive     CEFAZOLIN <=4 SENSITIVE Sensitive     CEFEPIME <=0.12 SENSITIVE Sensitive     CEFTRIAXONE <=0.25 SENSITIVE Sensitive     CIPROFLOXACIN <=0.25 SENSITIVE Sensitive     GENTAMICIN <=1 SENSITIVE Sensitive     IMIPENEM 2 SENSITIVE Sensitive     NITROFURANTOIN 128 RESISTANT Resistant     TRIMETH/SULFA <=20 SENSITIVE Sensitive     AMPICILLIN/SULBACTAM <=2 SENSITIVE Sensitive     PIP/TAZO <=4 SENSITIVE Sensitive     * >=100,000 COLONIES/mL PROTEUS MIRABILIS  Resp Panel by RT-PCR (Flu A&B, Covid) Nasopharyngeal Swab     Status: Abnormal   Collection Time: 04/16/20 10:35 PM   Specimen: Nasopharyngeal Swab; Nasopharyngeal(NP) swabs in vial transport medium  Result Value Ref Range Status   SARS Coronavirus 2 by RT PCR POSITIVE (A) NEGATIVE Final    Comment: RESULT CALLED TO, READ BACK BY AND VERIFIED WITH: B SANGALANG RN  04/17/20  0022 JDW (NOTE) SARS-CoV-2 target nucleic acids are DETECTED.  The SARS-CoV-2 RNA is generally detectable in upper respiratory specimens during the acute phase of infection. Positive results are indicative of the presence of the identified virus, but do not rule out bacterial infection or co-infection with other pathogens not detected by the test. Clinical correlation with patient history and other diagnostic information is necessary to determine patient infection status. The expected result is Negative.  Fact Sheet for Patients: EntrepreneurPulse.com.au  Fact Sheet for Healthcare Providers: IncredibleEmployment.be  This test is not yet approved or cleared by the [place] FDA and  has been authorized for detection and/or diagnosis of SARS-CoV-2 by FDA under an Emergency Use Authorization (EUA).  This EUA will remain in effect (meaning this test can be  used) for the duration of  the COVID-19 declaration under Section 564(b)(1) of the Act, 21 U.S.C. section 360bbb-3(b)(1), unless the authorization is terminated or revoked sooner.     Influenza A by PCR NEGATIVE NEGATIVE Final   Influenza B by PCR NEGATIVE NEGATIVE Final    Comment: (NOTE) The Xpert Xpress SARS-CoV-2/FLU/RSV plus assay is intended as an aid in the diagnosis of influenza from Nasopharyngeal swab specimens and should not be used as a sole basis for treatment. Nasal washings and aspirates  are unacceptable for Xpert Xpress SARS-CoV-2/FLU/RSV testing.  Fact Sheet for Patients: EntrepreneurPulse.com.au  Fact Sheet for Healthcare Providers: IncredibleEmployment.be  This test is not yet approved or cleared by the [place] FDA and has been authorized for detection and/or diagnosis of SARS-CoV-2 by FDA under an Emergency Use Authorization (EUA). This EUA will remain in effect (meaning this test can be used) for the duration of  the COVID-19 declaration under Section 564(b)(1) of the Act, 21 U.S.C. section 360bbb-3(b)(1), unless the authorization is terminated or revoked.  Performed at [facility], [place] [address], [place], [place] [address]   Culture, blood (Routine X 2) w Reflex to ID Panel     Status: None   Collection Time: 04/19/20  3:26 AM   Specimen: BLOOD  Result Value Ref Range Status   Specimen Description BLOOD RIGHT ANTECUBITAL  Final   Special Requests   Final    BOTTLES DRAWN AEROBIC AND ANAEROBIC Blood Culture adequate volume   Culture   Final    NO GROWTH 5 DAYS Performed at [facility], [place] [address] [place], [place] [address]    Report Status 04/24/2020 FINAL  Final  Culture, blood (Routine X 2) w Reflex to ID Panel     Status: None   Collection Time: 04/19/20  3:31 AM   Specimen: BLOOD LEFT HAND  Result Value Ref Range Status   Specimen Description BLOOD LEFT HAND  Final   Special Requests   Final    BOTTLES DRAWN AEROBIC AND ANAEROBIC Blood Culture adequate volume   Culture   Final    NO GROWTH 5 DAYS Performed at [facility], [place] [address], [place], [place] [address]    Report Status 04/24/2020 FINAL  Final     Janene Madeira, MSN, NP-C [facility] for Infectious Disease [email or web address] Pager: [phone] Office: [phone] [place]: [phone]

## 2020-04-24 NOTE — Progress Notes (Signed)
Gave report to [facility] [place], [place].

## 2020-04-24 NOTE — Plan of Care (Signed)
  Problem: Education: Goal: Knowledge of General Education information will improve Description: Including pain rating scale, medication(s)/side effects and non-pharmacologic comfort measures Outcome: Adequate for Discharge   Problem: Health Behavior/Discharge Planning: Goal: Ability to manage health-related needs will improve Outcome: Adequate for Discharge   Problem: Clinical Measurements: Goal: Ability to maintain clinical measurements within normal limits will improve Outcome: Adequate for Discharge Goal: Will remain free from infection Outcome: Adequate for Discharge Goal: Diagnostic test results will improve Outcome: Adequate for Discharge Goal: Cardiovascular complication will be avoided Outcome: Adequate for Discharge   Problem: Activity: Goal: Risk for activity intolerance will decrease Outcome: Adequate for Discharge   Problem: Nutrition: Goal: Adequate nutrition will be maintained Outcome: Adequate for Discharge

## 2020-04-24 NOTE — Discharge Summary (Addendum)
PATIENT DETAILS Name: LONDON TARNOWSKI Age: 87 y.o. Sex: male Date of Birth: [birth year] MRN: [id number]. Admitting Physician: Little Ishikawa, MD [id number], No Pcp Per  Admit Date: 04/16/2020 Discharge date: 04/24/2020  Recommendations for Outpatient Follow-up:  1. Follow up with PCP in 1-2 weeks 2. BMET, Vancomcyin levels twice a week, CBC once a week-please fax results to infectious disease clinic. 3. Please ensure follow-up with infectious disease clinic  Admitted From:  Home  Disposition: SNF   Home Health: No  Equipment/Devices: PICC line placed 3/2  Discharge Condition: Stable  CODE STATUS: FULL CODE  Diet recommendation:  Diet Order            Diet heart healthy/carb modified Room service appropriate? Yes; Fluid consistency: Thin  Diet effective now                  Brief Narrative: Patient is a 87 y.o. male HLD, HTN, EtOH use, dementia, PAF on Xarelto, history of PPM placement presented to hospital with sepsis-further evaluation revealed MRSA bacteremia.  Significant events: 2/24>> admit for sepsis-found to have MRSA bacteremia  Significant studies: 2/24>> chest x-ray: Central venous congestion 2/25>> CT abdomen/pelvis with contrast: Cholelithiasis, infrarenal aortic aneurysm 2/25>> Echo: EF 60-65%  Antimicrobial therapy: Vancomycin: 2/25>>  Microbiology data: 2/24>> blood culture: MRSA 2/24>> urine culture: Proteus/E. Coli 2/27>> blood culture: No growth  Procedures : 3/3>> PICC line placement  Consults: ID, cardiology   Brief Hospital Course: Sepsis due to MRSA bacteremia: Sepsis physiology has resolved-some mild leukocytosis persists-repeat blood cultures on 2/27 remains negative so far.  Plans are for 6-week of IV vancomycin from 2/27-patient will require follow-up with the infectious disease clinic for further suppressive antimicrobial therapy.  Please note-evaluated by EP-recommendations are not to extract PPM-and to  treat with suppressive antimicrobial therapy.  Patient will need twice weekly chemistries and vancomycin levels, once weekly CBC-please fax results to infectious disease clinic.  Acute metabolic encephalopathy: Secondary to above-suspect has some amount of dementia at baseline-he is able to follow simple commands today.  Asymptomatic bacteriuria: Does not require Abx treatment at this point  PAF: Paced rhythm on telemetry-on Xarelto.   History of PPM implantation: Unclear why this was implanted-Per EP recommendations are to pursue conservative management with suppressive antibiotic strategy rather than removing pacer.  HTN: BP stable-resume lisinopril   BPH: Continue Flomax  Dementia: Pleasantly confused-maintain delirium precautions  History of alcohol use: Mildly confused but suspect this is from dementia-no other clinical features suggestive of DTs-on Ativan per CIWA protocol.  Recent Covid infection-Home test positive on 1/19: Does not require any further isolation.  Covid PCR positive at admission-is a reflection of his recent positive test-CT value of 35 is reassuring of patient not being noninfectious.  Obesity: Estimated body mass index is 34.2 kg/m as calculated from the following:   Height as of this encounter: _0  (1.778 m).   Weight as of this encounter: 108.1 kg.   Discharge Diagnoses:  Principal Problem:   MRSA bacteremia Active Problems:   Sepsis ([place])   Lactic acidosis   Essential hypertension   Benign prostatic hyperplasia   Dementia without behavioral disturbance (HCC)   Presence of cardiac pacemaker   COVID-19 virus infection   Hyperglycemia   Acute cystitis without hematuria   Alcohol dependence with uncomplicated withdrawal ([place])   Pacemaker infection ([place])   Diabetic foot ulcer ([place])   Discharge Instructions:  Activity:  As tolerated with Full fall precautions use walker/cane & assistance as needed  Discharge Instructions     [facility] pharmacist to adjust dose for Vancomycin, Aminoglycosides and other anti-infective therapies as requested by physician.   Complete by: As directed    [facility] to provide Cath Flo 55m   Complete by: As directed    Administer for PICC line occlusion and as ordered by physician for other access device issues.   Anaphylaxis Kit: Provided to treat any anaphylactic reaction to the medication being provided to the patient if First Dose or when requested by physician   Complete by: As directed    Epinephrine 158mml vial / amp: Administer 0.62m85m0.62ml46mubcutaneously once for moderate to severe anaphylaxis, nurse to call physician and pharmacy when reaction occurs and call 911 if needed for immediate care   Diphenhydramine 50mg51mIV vial: Administer 25-50mg 40mM PRN for first dose reaction, rash, itching, mild reaction, nurse to call physician and pharmacy when reaction occurs   Sodium Chloride 0.9% NS 500ml I28mdminister if needed for hypovolemic blood pressure drop or as ordered by physician after call to physician with anaphylactic reaction   Change dressing on IV access line weekly and PRN   Complete by: As directed    Flush IV access with Sodium Chloride 0.9% and Heparin 10 units/ml or 100 units/ml   Complete by: As directed    Home infusion instructions - [facility]   Complete by: As directed    Instructions: Flush IV access with Sodium Chloride 0.9% and Heparin 10units/ml or 100units/ml   Change dressing on IV access line: Weekly and PRN   Instructions Cath Flo 2mg: Ad9mister for PICC Line occlusion and as ordered by physician for other access device   [facility] pharmacist to adjust dose for: Vancomycin, Aminoglycosides and other anti-infective therapies as requested by physician   Method of administration may be changed at the discretion of home infusion pharmacist based upon assessment of the patient and/or caregiver's ability to  self-administer the medication ordered   Complete by: As directed      Allergies as of 04/24/2020   No Known Allergies     Medication List    TAKE these medications   folic acid 1 MG tablet Commonly known as: FOLVITE Take 1 mg by mouth in the morning.   lisinopril 40 MG tablet Commonly known as: ZESTRIL Take 0.5 tablets (20 mg total) by mouth every evening. What changed: how much to take   Magnesium Oxide 420 MG Tabs Take 420 mg by mouth in the morning.   oxybutynin 5 MG tablet Commonly known as: DITROPAN Take 5 mg by mouth in the morning and at bedtime.   rivaroxaban 20 MG Tabs tablet Commonly known as: XARELTO Take 20 mg by mouth every evening.   SENIOR VITES PO Take 1 tablet by mouth daily with breakfast.   STOOL SOFTENER PO Take 1 capsule by mouth in the morning and at bedtime.   tamsulosin 0.4 MG Caps capsule Commonly known as: FLOMAX Take 0.4 mg by mouth daily after breakfast.   thiamine 100 MG tablet Take 100 mg by mouth in the morning.   vancomycin HCl 1500 MG/300ML Soln Commonly known as: VANCOREADY Inject 300 mLs (1,500 mg total) into the vein daily.   Vitamin D-3 25 MCG (1000 UT) Caps Take 1,000 Units by mouth in the morning.            Discharge Care Instructions  (From admission, onward)         Start  Ordered   04/24/20 0000  Change dressing on IV access line weekly and PRN  (Home infusion instructions - [facility] )        04/24/20 0949          Contact information for follow-up providers    Tommy Medal, Lavell Islam, MD. Schedule an appointment as soon as possible for a visit.   Specialty: Infectious Diseases Why: office will call Contact information: [address] [place] [address] [phone]        Primary care practitioner. Schedule an appointment as soon as possible for a visit in 1 week(s).            Contact information for after-discharge care    Destination    [facility]erred  [facility] .   Service: Skilled Nursing Contact information: [place] [phone]                 No Known Allergies   Other Procedures/Studies: CT ABDOMEN PELVIS W CONTRAST  Result Date: 04/17/2020 CLINICAL DATA:  Fatigue, fever, weakness and confusion. EXAM: CT ABDOMEN AND PELVIS WITH CONTRAST TECHNIQUE: Multidetector CT imaging of the abdomen and pelvis was performed using the standard protocol following bolus administration of intravenous contrast. CONTRAST:  139m OMNIPAQUE IOHEXOL 300 MG/ML  SOLN COMPARISON:  None. FINDINGS: Lower chest: Mild areas of scarring and/or atelectasis are seen within the bilateral lung bases. Hepatobiliary: No focal liver abnormality is seen. Numerous subcentimeter gallstones are seen within the lumen of an otherwise normal-appearing gallbladder. Pancreas: Unremarkable. No pancreatic ductal dilatation or surrounding inflammatory changes. Spleen: Normal in size without focal abnormality. Adrenals/Urinary Tract: Adrenal glands are unremarkable. Kidneys are normal, without renal calculi or hydronephrosis. A 4.4 cm x 3.7 cm simple cyst is seen within the lateral aspect of the mid left kidney. An additional 2.2 cm x 1.5 cm exophytic simple cyst is seen along the anterior aspect of the mid left kidney. A 0.7 cm right renal cyst is also seen. Bladder is unremarkable. Stomach/Bowel: There is a small hiatal hernia. Appendix appears normal. No evidence of bowel wall thickening, distention, or inflammatory changes. Noninflamed diverticula are seen throughout the large bowel. Vascular/Lymphatic: There is marked severity aortic calcification with 4.1 cm x 3.8 cm aneurysmal dilatation of the infrarenal abdominal aorta. There is no evidence of aortic dissection. There is mild para-aortic and aortocaval lymphadenopathy. The largest lymph node measures approximately 2.2 cm x 1.1 cm. Reproductive: Multiple prostate radiation implantation seeds are  seen. Other: Small, bilateral fat containing inguinal hernias are seen. No abdominopelvic ascites. Musculoskeletal: No acute or significant osseous findings. IMPRESSION: 1. Cholelithiasis without evidence of cholecystitis. 2. 4.1 cm x 3.8 cm infrarenal abdominal aortic aneurysm. 3. Colonic diverticulosis. 4. Small hiatal hernia. 5. Small, bilateral fat containing inguinal hernias. 6. Aortic atherosclerosis. Aortic Atherosclerosis (ICD10-I70.0). Electronically Signed   By: TVirgina NorfolkM.D.   On: 04/17/2020 01:58   DG Chest Port 1 View  Result Date: 04/23/2020 CLINICAL DATA:  PICC line placement EXAM: PORTABLE CHEST 1 VIEW COMPARISON:  April 16, 2020 FINDINGS: The heart size and mediastinal contours are mildly enlarged. A left-sided pacemaker seen with the lead tip at the right ventricle. Aortic knob calcifications are seen. A right-sided PICC is seen with the tip in the mid SVC. There is slight prominence of the central pulmonary vasculature. No acute osseous abnormality. IMPRESSION: Tip of the right-sided PICC within the mid SVC. Mild pulmonary vascular congestion. Electronically Signed   By:  Prudencio Pair M.D.   On: 04/23/2020 18:13   DG Chest Port 1 View  Result Date: 04/16/2020 CLINICAL DATA:  Upper abdominal pain, fatigue, sepsis EXAM: PORTABLE CHEST 1 VIEW COMPARISON:  03/11/2020 FINDINGS: Single frontal view of the chest demonstrates stable single lead pacer. Cardiac silhouette is enlarged but stable. Continued central vascular congestion without airspace disease, effusion, or pneumothorax. IMPRESSION: 1. Chronic central vascular congestion.  No acute airspace disease. Electronically Signed   By: Randa Ngo M.D.   On: 04/16/2020 21:35   DG Foot 2 Views Left  Result Date: 04/18/2020 CLINICAL DATA:  Diabetic foot ulcer. EXAM: LEFT FOOT - 2 VIEW COMPARISON:  None. FINDINGS: There is no evidence of fracture or dislocation. There is no evidence of arthropathy or other focal bone abnormality.  Soft tissues are unremarkable. IMPRESSION: Negative. Electronically Signed   By: Marijo Conception M.D.   On: 04/18/2020 15:09   ECHOCARDIOGRAM LIMITED  Result Date: 04/17/2020    ECHOCARDIOGRAM LIMITED REPORT   Patient Name:   AMAURI KEEFE Date of Exam: 04/17/2020 Medical Rec #:  [id number]     Height:       70.0 in Accession #:    [id number]    Weight:       238.3 lb Date of Birth:  Feb 08, 1934    BSA:          2.249 m Patient Age:    66 years      BP:           141/66 mmHg Patient Gender: M             HR:           60 bpm. Exam Location:  Inpatient Procedure: 2D Echo Indications:    acute systolic chf.  History:        Patient has no prior history of Echocardiogram examinations.                 Pacemaker, Covid. Sepsis; Risk Factors:Hypertension.  Sonographer:    Johny Chess Referring Phys: [id number] Vernelle Emerald  Sonographer Comments: Image acquisition challenging due to uncooperative patient. IMPRESSIONS  1. Left ventricular ejection fraction, by estimation, is 60 to 65%. The left ventricle has normal function. The left ventricle has no regional wall motion abnormalities. There is mild left ventricular hypertrophy.  2. Right ventricular systolic function is normal. The right ventricular size is normal.  3. Left atrial size was moderately dilated.  4. The mitral valve is normal in structure. Trivial mitral valve regurgitation. No evidence of mitral stenosis.  5. The aortic valve is normal in structure. There is moderate calcification of the aortic valve. There is moderate thickening of the aortic valve. Aortic valve regurgitation is not visualized. Mild to moderate aortic valve sclerosis/calcification is present, without any evidence of aortic stenosis.  6. The inferior vena cava is dilated in size with <50% respiratory variability, suggesting right atrial pressure of 15 mmHg. FINDINGS  Left Ventricle: Left ventricular ejection fraction, by estimation, is 60 to 65%. The left ventricle has normal  function. The left ventricle has no regional wall motion abnormalities. The left ventricular internal cavity size was normal in size. There is  mild left ventricular hypertrophy. Right Ventricle: The right ventricular size is normal. No increase in right ventricular wall thickness. Right ventricular systolic function is normal. Left Atrium: Left atrial size was moderately dilated. Right Atrium: Right atrial size was normal in size. Pericardium: There is no evidence of pericardial  effusion. Mitral Valve: The mitral valve is normal in structure. Trivial mitral valve regurgitation. No evidence of mitral valve stenosis. Tricuspid Valve: The tricuspid valve is normal in structure. Tricuspid valve regurgitation is not demonstrated. No evidence of tricuspid stenosis. Aortic Valve: The aortic valve is normal in structure. There is moderate calcification of the aortic valve. There is moderate thickening of the aortic valve. Aortic valve regurgitation is not visualized. Mild to moderate aortic valve sclerosis/calcification is present, without any evidence of aortic stenosis. Pulmonic Valve: The pulmonic valve was normal in structure. Pulmonic valve regurgitation is not visualized. No evidence of pulmonic stenosis. Aorta: The aortic root is normal in size and structure. Venous: The inferior vena cava is dilated in size with less than 50% respiratory variability, suggesting right atrial pressure of 15 mmHg. IAS/Shunts: No atrial level shunt detected by color flow Doppler. Additional Comments: A pacer wire is visualized. LEFT VENTRICLE PLAX 2D LVIDd:         4.60 cm  Diastology LVIDs:         2.60 cm  LV e' medial:    8.70 cm/s LV PW:         1.10 cm  LV E/e' medial:  14.3 LV IVS:        1.10 cm  LV e' lateral:   9.68 cm/s LVOT diam:     2.30 cm  LV E/e' lateral: 12.8 LV SV:         88 LV SV Index:   39 LVOT Area:     4.15 cm  IVC IVC diam: 2.30 cm LEFT ATRIUM         Index LA diam:    5.40 cm 2.40 cm/m  AORTIC VALVE LVOT  Vmax:   102.00 cm/s LVOT Vmean:  70.000 cm/s LVOT VTI:    0.211 m  AORTA Ao Asc diam: 3.60 cm MITRAL VALVE MV Area (PHT): 5.27 cm     SHUNTS MV Decel Time: 144 msec     Systemic VTI:  0.21 m MV E velocity: 124.00 cm/s  Systemic Diam: 2.30 cm MV A velocity: 43.50 cm/s MV E/A ratio:  2.85 Candee Furbish MD Electronically signed by Candee Furbish MD Signature Date/Time: 04/17/2020/11:51:16 AM    Final    [place] EKG SITE RITE  Result Date: 04/23/2020 If Site Rite image not attached, placement could not be confirmed due to current cardiac rhythm.    TODAY-DAY OF DISCHARGE:  Subjective:   Szymon Foiles was lying comfortably in bed-he is very pleasantly confused-able to follow simple commands. Objective:   Blood pressure (!) 155/66, pulse 62, temperature 97.7 F (36.5 C), temperature source Oral, resp. rate 18, height _0  (1.778 m), weight 108.1 kg, SpO2 97 %.  Intake/Output Summary (Last 24 hours) at 04/24/2020 0952 Last data filed at 04/24/2020 0500 Gross per 24 hour  Intake 330 ml  Output 800 ml  Net -470 ml   Filed Weights   04/16/20 2056 04/17/20 0422  Weight: 110 kg 108.1 kg    Exam: Pleasantly confused, No new F.N deficits, Normal affect [place].AT,PERRAL Supple Neck,No JVD, No cervical lymphadenopathy appriciated.  Symmetrical Chest wall movement, Good air movement bilaterally, CTAB RRR,No Gallops,Rubs or new Murmurs, No Parasternal Heave +ve B.Sounds, Abd Soft, Non tender, No organomegaly appriciated, No rebound -guarding or rigidity. No Cyanosis, Clubbing or edema, No new Rash or bruise   PERTINENT RADIOLOGIC STUDIES: DG Chest Port 1 View  Result Date: 04/23/2020 CLINICAL DATA:  PICC line placement EXAM: PORTABLE CHEST 1  VIEW COMPARISON:  April 16, 2020 FINDINGS: The heart size and mediastinal contours are mildly enlarged. A left-sided pacemaker seen with the lead tip at the right ventricle. Aortic knob calcifications are seen. A right-sided PICC is seen with the tip in the mid SVC.  There is slight prominence of the central pulmonary vasculature. No acute osseous abnormality. IMPRESSION: Tip of the right-sided PICC within the mid SVC. Mild pulmonary vascular congestion. Electronically Signed   By: Prudencio Pair M.D.   On: 04/23/2020 18:13   [place] EKG SITE RITE  Result Date: 04/23/2020 If Site Rite image not attached, placement could not be confirmed due to current cardiac rhythm.    PERTINENT LAB RESULTS: CBC: Recent Labs    04/22/20 0156  WBC 17.2*  HGB 13.1  HCT 40.5  PLT 285   CMET CMP     Component Value Date/Time   NA 137 04/22/2020 0156   K 3.9 04/22/2020 0156   CL 103 04/22/2020 0156   CO2 23 04/22/2020 0156   GLUCOSE 125 (H) 04/22/2020 0156   BUN 19 04/22/2020 0156   CREATININE 0.82 04/22/2020 0156   CALCIUM 9.0 04/22/2020 0156   PROT 6.1 (L) 04/22/2020 0156   ALBUMIN 2.5 (L) 04/22/2020 0156   AST 26 04/22/2020 0156   ALT 27 04/22/2020 0156   ALKPHOS 67 04/22/2020 0156   BILITOT 1.3 (H) 04/22/2020 0156   GFRNONAA >60 04/22/2020 0156   GFRAA  12/16/2008 0430    >60        The eGFR has been calculated using the MDRD equation. This calculation has not been validated in all clinical situations. eGFR's persistently <60 mL/min signify possible Chronic Kidney Disease.    GFR Estimated Creatinine Clearance: 82.5 mL/min (by C-G formula based on SCr of 0.82 mg/dL). No results for input(s): LIPASE, AMYLASE in the last 72 hours. No results for input(s): CKTOTAL, CKMB, CKMBINDEX, TROPONINI in the last 72 hours. Invalid input(s): POCBNP No results for input(s): DDIMER in the last 72 hours. No results for input(s): HGBA1C in the last 72 hours. No results for input(s): CHOL, HDL, LDLCALC, TRIG, CHOLHDL, LDLDIRECT in the last 72 hours. No results for input(s): TSH, T4TOTAL, T3FREE, THYROIDAB in the last 72 hours.  Invalid input(s): FREET3 No results for input(s): VITAMINB12, FOLATE, FERRITIN, TIBC, IRON, RETICCTPCT in the last 72 hours. Coags: No  results for input(s): INR in the last 72 hours.  Invalid input(s): PT Microbiology: Recent Results (from the past 240 hour(s))  Blood culture (routine single)     Status: Abnormal   Collection Time: 04/16/20  9:29 PM   Specimen: BLOOD  Result Value Ref Range Status   Specimen Description BLOOD LEFT ANTECUBITAL  Final   Special Requests   Final    BOTTLES DRAWN AEROBIC AND ANAEROBIC Blood Culture adequate volume   Culture  Setup Time   Final    GRAM POSITIVE COCCI IN CLUSTERS IN BOTH AEROBIC AND ANAEROBIC BOTTLES CRITICAL RESULT CALLED TO, READ BACK BY AND VERIFIED WITHHyman Bible PHARMD [id number] 04/17/20 A BROWNING Performed at [facility], [place] [address] [place], [place] [address]    Culture METHICILLIN RESISTANT STAPHYLOCOCCUS AUREUS (A)  Final   Report Status 04/19/2020 FINAL  Final   Organism ID, Bacteria METHICILLIN RESISTANT STAPHYLOCOCCUS AUREUS  Final      Susceptibility   Methicillin resistant staphylococcus aureus - MIC*    CIPROFLOXACIN >=8 RESISTANT Resistant     ERYTHROMYCIN >=8 RESISTANT Resistant     GENTAMICIN <=0.5  SENSITIVE Sensitive     OXACILLIN >=4 RESISTANT Resistant     TETRACYCLINE <=1 SENSITIVE Sensitive     VANCOMYCIN 1 SENSITIVE Sensitive     TRIMETH/SULFA <=10 SENSITIVE Sensitive     CLINDAMYCIN >=8 RESISTANT Resistant     RIFAMPIN <=0.5 SENSITIVE Sensitive     Inducible Clindamycin NEGATIVE Sensitive     * METHICILLIN RESISTANT STAPHYLOCOCCUS AUREUS  Blood Culture ID Panel (Reflexed)     Status: Abnormal   Collection Time: 04/16/20  9:29 PM  Result Value Ref Range Status   Enterococcus faecalis NOT DETECTED NOT DETECTED Final   Enterococcus Faecium NOT DETECTED NOT DETECTED Final   Listeria monocytogenes NOT DETECTED NOT DETECTED Final   Staphylococcus species DETECTED (A) NOT DETECTED Final    Comment: CRITICAL RESULT CALLED TO, READ BACK BY AND VERIFIED WITHHyman Bible PHARMD 1645 04/17/20 A BROWNING    Staphylococcus aureus (BCID) DETECTED  (A) NOT DETECTED Final    Comment: Methicillin (oxacillin)-resistant Staphylococcus aureus (MRSA). MRSA is predictably resistant to beta-lactam antibiotics (except ceftaroline). Preferred therapy is vancomycin unless clinically contraindicated. Patient requires contact precautions if  hospitalized. CRITICAL RESULT CALLED TO, READ BACK BY AND VERIFIED WITH: Hyman Bible PHARMD 1610 04/17/20 A BROWNING    Staphylococcus epidermidis NOT DETECTED NOT DETECTED Final   Staphylococcus lugdunensis NOT DETECTED NOT DETECTED Final   Streptococcus species NOT DETECTED NOT DETECTED Final   Streptococcus agalactiae NOT DETECTED NOT DETECTED Final   Streptococcus pneumoniae NOT DETECTED NOT DETECTED Final   Streptococcus pyogenes NOT DETECTED NOT DETECTED Final   A.calcoaceticus-baumannii NOT DETECTED NOT DETECTED Final   Bacteroides fragilis NOT DETECTED NOT DETECTED Final   Enterobacterales NOT DETECTED NOT DETECTED Final   Enterobacter cloacae complex NOT DETECTED NOT DETECTED Final   Escherichia coli NOT DETECTED NOT DETECTED Final   Klebsiella aerogenes NOT DETECTED NOT DETECTED Final   Klebsiella oxytoca NOT DETECTED NOT DETECTED Final   Klebsiella pneumoniae NOT DETECTED NOT DETECTED Final   Proteus species NOT DETECTED NOT DETECTED Final   Salmonella species NOT DETECTED NOT DETECTED Final   Serratia marcescens NOT DETECTED NOT DETECTED Final   Haemophilus influenzae NOT DETECTED NOT DETECTED Final   Neisseria meningitidis NOT DETECTED NOT DETECTED Final   Pseudomonas aeruginosa NOT DETECTED NOT DETECTED Final   Stenotrophomonas maltophilia NOT DETECTED NOT DETECTED Final   Candida albicans NOT DETECTED NOT DETECTED Final   Candida auris NOT DETECTED NOT DETECTED Final   Candida glabrata NOT DETECTED NOT DETECTED Final   Candida krusei NOT DETECTED NOT DETECTED Final   Candida parapsilosis NOT DETECTED NOT DETECTED Final   Candida tropicalis NOT DETECTED NOT DETECTED Final   Cryptococcus  neoformans/gattii NOT DETECTED NOT DETECTED Final   Meth resistant mecA/C and MREJ DETECTED (A) NOT DETECTED Final    Comment: CRITICAL RESULT CALLED TO, READ BACK BY AND VERIFIED WITHHyman Bible PHARMD 1645 04/17/20 A BROWNING Performed at [facility], [address] [place], [place] [address]   Urine culture     Status: Abnormal   Collection Time: 04/16/20 10:30 PM   Specimen: In/Out Cath Urine  Result Value Ref Range Status   Specimen Description IN/OUT CATH URINE  Final   Special Requests   Final    NONE Performed at [facility], [place] [address] [place], [place] [address]    Culture (A)  Final    >=100,000 COLONIES/mL PROTEUS MIRABILIS >=100,000 COLONIES/mL ESCHERICHIA COLI    Report Status 04/20/2020 FINAL  Final  Organism ID, Bacteria PROTEUS MIRABILIS (A)  Final   Organism ID, Bacteria ESCHERICHIA COLI (A)  Final      Susceptibility   Escherichia coli - MIC*    AMPICILLIN >=32 RESISTANT Resistant     CEFAZOLIN 16 SENSITIVE Sensitive     CEFEPIME <=0.12 SENSITIVE Sensitive     CEFTRIAXONE <=0.25 SENSITIVE Sensitive     CIPROFLOXACIN 1 SENSITIVE Sensitive     GENTAMICIN <=1 SENSITIVE Sensitive     IMIPENEM <=0.25 SENSITIVE Sensitive     NITROFURANTOIN <=16 SENSITIVE Sensitive     TRIMETH/SULFA >=320 RESISTANT Resistant     AMPICILLIN/SULBACTAM >=32 RESISTANT Resistant     PIP/TAZO 8 SENSITIVE Sensitive     * >=100,000 COLONIES/mL ESCHERICHIA COLI   Proteus mirabilis - MIC*    AMPICILLIN <=2 SENSITIVE Sensitive     CEFAZOLIN <=4 SENSITIVE Sensitive     CEFEPIME <=0.12 SENSITIVE Sensitive     CEFTRIAXONE <=0.25 SENSITIVE Sensitive     CIPROFLOXACIN <=0.25 SENSITIVE Sensitive     GENTAMICIN <=1 SENSITIVE Sensitive     IMIPENEM 2 SENSITIVE Sensitive     NITROFURANTOIN 128 RESISTANT Resistant     TRIMETH/SULFA <=20 SENSITIVE Sensitive     AMPICILLIN/SULBACTAM <=2 SENSITIVE Sensitive     PIP/TAZO <=4 SENSITIVE Sensitive     * >=100,000 COLONIES/mL PROTEUS  MIRABILIS  Resp Panel by RT-PCR (Flu A&B, Covid) Nasopharyngeal Swab     Status: Abnormal   Collection Time: 04/16/20 10:35 PM   Specimen: Nasopharyngeal Swab; Nasopharyngeal(NP) swabs in vial transport medium  Result Value Ref Range Status   SARS Coronavirus 2 by RT PCR POSITIVE (A) NEGATIVE Final    Comment: RESULT CALLED TO, READ BACK BY AND VERIFIED WITH: B SANGALANG RN 04/17/20  0022 JDW (NOTE) SARS-CoV-2 target nucleic acids are DETECTED.  The SARS-CoV-2 RNA is generally detectable in upper respiratory specimens during the acute phase of infection. Positive results are indicative of the presence of the identified virus, but do not rule out bacterial infection or co-infection with other pathogens not detected by the test. Clinical correlation with patient history and other diagnostic information is necessary to determine patient infection status. The expected result is Negative.  Fact Sheet for Patients: EntrepreneurPulse.com.au  Fact Sheet for Healthcare Providers: IncredibleEmployment.be  This test is not yet approved or cleared by the [place] FDA and  has been authorized for detection and/or diagnosis of SARS-CoV-2 by FDA under an Emergency Use Authorization (EUA).  This EUA will remain in effect (meaning this test can be  used) for the duration of  the COVID-19 declaration under Section 564(b)(1) of the Act, 21 U.S.C. section 360bbb-3(b)(1), unless the authorization is terminated or revoked sooner.     Influenza A by PCR NEGATIVE NEGATIVE Final   Influenza B by PCR NEGATIVE NEGATIVE Final    Comment: (NOTE) The Xpert Xpress SARS-CoV-2/FLU/RSV plus assay is intended as an aid in the diagnosis of influenza from Nasopharyngeal swab specimens and should not be used as a sole basis for treatment. Nasal washings and aspirates are unacceptable for Xpert Xpress SARS-CoV-2/FLU/RSV testing.  Fact Sheet for  Patients: EntrepreneurPulse.com.au  Fact Sheet for Healthcare Providers: IncredibleEmployment.be  This test is not yet approved or cleared by the [place] FDA and has been authorized for detection and/or diagnosis of SARS-CoV-2 by FDA under an Emergency Use Authorization (EUA). This EUA will remain in effect (meaning this test can be used) for the duration of the COVID-19 declaration under Section 564(b)(1) of the Act, 21 U.S.C.  section 360bbb-3(b)(1), unless the authorization is terminated or revoked.  Performed at [facility], [place] [address], [place], [place] [address]   Culture, blood (Routine X 2) w Reflex to ID Panel     Status: None   Collection Time: 04/19/20  3:26 AM   Specimen: BLOOD  Result Value Ref Range Status   Specimen Description BLOOD RIGHT ANTECUBITAL  Final   Special Requests   Final    BOTTLES DRAWN AEROBIC AND ANAEROBIC Blood Culture adequate volume   Culture   Final    NO GROWTH 5 DAYS Performed at [facility], [place] [address] [place], [place] [address]    Report Status 04/24/2020 FINAL  Final  Culture, blood (Routine X 2) w Reflex to ID Panel     Status: None   Collection Time: 04/19/20  3:31 AM   Specimen: BLOOD LEFT HAND  Result Value Ref Range Status   Specimen Description BLOOD LEFT HAND  Final   Special Requests   Final    BOTTLES DRAWN AEROBIC AND ANAEROBIC Blood Culture adequate volume   Culture   Final    NO GROWTH 5 DAYS Performed at [facility], [place] [address], [place],  [address]    Report Status 04/24/2020 FINAL  Final    FURTHER DISCHARGE INSTRUCTIONS:  Get Medicines reviewed and adjusted: Please take all your medications with you for your next visit with your Primary MD  Laboratory/radiological data: Please request your Primary MD to go over all hospital tests and procedure/radiological results at the follow up, please ask your Primary MD to get all Hospital  records sent to his/her office.  In some cases, they will be blood work, cultures and biopsy results pending at the time of your discharge. Please request that your primary care M.D. goes through all the records of your hospital data and follows up on these results.  Also Note the following: If you experience worsening of your admission symptoms, develop shortness of breath, life threatening emergency, suicidal or homicidal thoughts you must seek medical attention immediately by calling 911 or calling your MD immediately  if symptoms less severe.  You must read complete instructions/literature along with all the possible adverse reactions/side effects for all the Medicines you take and that have been prescribed to you. Take any new Medicines after you have completely understood and accpet all the possible adverse reactions/side effects.   Do not drive when taking Pain medications or sleeping medications (Benzodaizepines)  Do not take more than prescribed Pain, Sleep and Anxiety Medications. It is not advisable to combine anxiety,sleep and pain medications without talking with your primary care practitioner  Special Instructions: If you have smoked or chewed Tobacco  in the last 2 yrs please stop smoking, stop any regular Alcohol  and or any Recreational drug use.  Wear Seat belts while driving.  Please note: You were cared for by a hospitalist during your hospital stay. Once you are discharged, your primary care physician will handle any further medical issues. Please note that NO REFILLS for any discharge medications will be authorized once you are discharged, as it is imperative that you return to your primary care physician (or establish a relationship with a primary care physician if you do not have one) for your post hospital discharge needs so that they can reassess your need for medications and monitor your lab values.  Total Time spent coordinating discharge including counseling,  education and face to face time equals 35  minutes.  SignedOren Binet 04/24/2020 9:52 AM

## 2020-04-24 NOTE — TOC Transition Note (Signed)
Transition of Care [facility]) - CM/SW Discharge Note   Patient Details  Name: Adam Conner MRN: [id number] Date of Birth: 1933/03/12  [facility] CM/SW Contact:  Lynett Grimes Phone Number: 04/24/2020, 11:23 AM   Clinical Narrative:     Patient will DC to: [facility] Anticipated DC date: 04/24/2020 Family notified: Adam Conner Transport by: Sharin Mons   Per MD patient ready for DC to [facility] . RN to call report prior to discharge [phone]). RN, patient, patient's family, and facility notified of DC. Discharge Summary and FL2 sent to facility. DC packet on chart. Ambulance transport requested for patient.   CSW will sign off for now as social work intervention is no longer needed. Please consult [place] again if new needs arise.    Final next level of care: Skilled Nursing Facility Barriers to Discharge: Barriers Resolved   Patient Goals and CMS Choice Patient states their goals for this hospitalization and ongoing recovery are:: Rehab CMS Medicare.gov Compare Post Acute Care list provided to:: Patient Choice offered to / list presented to : Patient  Discharge Placement              Patient chooses bed at: [facility] Patient to be transferred to facility by: [facility] Name of family member notified: Adam Conner (daughter) [phone] Patient and family notified of of transfer: 04/24/20  Discharge Plan and Services   Discharge Planning Services: CM Consult                                 Social Determinants of Health (SDOH) Interventions     Readmission Risk Interventions No flowsheet data found.

## 2020-04-24 NOTE — Discharge Instructions (Signed)

## 2020-04-24 NOTE — Care Management Important Message (Signed)
Important Message  Patient Details  Name: Adam Conner MRN: [id number] Date of Birth: May 27, 1933   Medicare Important Message Given:  Yes - Important Message mailed due to current National Emergency   Verbal consent obtained due to current National Emergency  Relationship to patient: Self Contact Name: Mcenery Call Date: 04/24/20  Time: 1409 Phone: [phone] Outcome: No Answer/Busy Important Message mailed to: Patient address on file    Orson Aloe 04/24/2020, 2:09 PM

## 2020-04-24 NOTE — Plan of Care (Signed)
  Problem: Education: Goal: Knowledge of General Education information will improve Description: Including pain rating scale, medication(s)/side effects and non-pharmacologic comfort measures Outcome: Progressing   Problem: Health Behavior/Discharge Planning: Goal: Ability to manage health-related needs will improve Outcome: Progressing   Problem: Clinical Measurements: Goal: Ability to maintain clinical measurements within normal limits will improve Outcome: Progressing Goal: Will remain free from infection Outcome: Progressing   Problem: Activity: Goal: Risk for activity intolerance will decrease Outcome: Progressing   Problem: Nutrition: Goal: Adequate nutrition will be maintained Outcome: Progressing   No acute events took place overnight, vital signs remained stable. Will continue to monitor.

## 2020-04-24 NOTE — Progress Notes (Signed)
PHARMACY CONSULT NOTE FOR:  OUTPATIENT  PARENTERAL ANTIBIOTIC THERAPY (OPAT)  Indication: MRSA bacteremia and possible pacemaker infection  Regimen: Vancomycin 1500 mg IV q24h  End date: 05/31/2020  IV antibiotic discharge orders are pended. To discharging provider:  please sign these orders via discharge navigator,  Select New Orders & click on the button choice - Manage This Unsigned Work.     Thank you for allowing pharmacy to be a part of this patient's care.  Jeannetta Nap 04/24/2020, 11:39 AM

## 2020-05-05 ENCOUNTER — Inpatient Hospital Stay: Payer: Medicare PPO | Admitting: Infectious Diseases

## 2020-05-08 ENCOUNTER — Telehealth: Payer: Self-pay | Admitting: Infectious Diseases

## 2020-05-08 NOTE — Telephone Encounter (Signed)
Spoke with Adam Conner, Charity fundraiser at [facility].  Vanc troughs this week 8.9 and now 9.7 on 1500mg  IV qd. Will split to BID dosing 750mg  Q12 to see if that bumps him into the trough of 15-20 without having to increase the dose.   Will see what levels are next week and increase to 1000 mg BID from there if still sub therapeutic.   Orders accepted.    , MSN, NP-C [facility]  [email or web address] Pager: [phone] Office: [phone] [facility] Main Line: [phone]

## 2020-05-20 ENCOUNTER — Telehealth: Payer: Self-pay | Admitting: Infectious Diseases

## 2020-05-20 NOTE — Telephone Encounter (Signed)
Thank you - it was on the face sheet he refused labs 3/24 (which is the missing date for 2x weekly labs listed)  Glad that was a 1 time thing apparently.

## 2020-05-20 NOTE — Telephone Encounter (Signed)
Received labs from 3/24 - patient is apparently refusing labs.   Can we call the nursing home to see if he is still refusing labs?   With a vanc trough of 12 I would like to increase to 1 gm BID for the vanc dose,  but if he is not agreeable to safety labs we should stop the vanc and switch to pills to continue treatment for him.   Thank you for any updates you are able to get.

## 2020-05-20 NOTE — Telephone Encounter (Signed)
Spoke with Clinical biochemist at [facility]. She has no documentation that patient has been refusing labs and reports previous vanc troughs 3/28-12.8 3/17-9.7 3/10-9.9  Advised nurse to increase vanc dose to 1gm BID per Rexene Alberts, NP and fax labs to [phone] and make [place] aware if patient refuses labs.  Valarie Cones

## 2020-05-25 ENCOUNTER — Inpatient Hospital Stay (HOSPITAL_COMMUNITY): Payer: Medicare PPO

## 2020-05-25 ENCOUNTER — Other Ambulatory Visit: Payer: Self-pay

## 2020-05-25 ENCOUNTER — Encounter (HOSPITAL_COMMUNITY): Payer: Self-pay

## 2020-05-25 ENCOUNTER — Emergency Department (HOSPITAL_COMMUNITY): Payer: Medicare PPO

## 2020-05-25 ENCOUNTER — Inpatient Hospital Stay (HOSPITAL_COMMUNITY)
Admission: EM | Admit: 2020-05-25 | Discharge: 2020-06-05 | DRG: 189 | Disposition: A | Discharge status: Skilled Nursing Facility | Payer: Medicare PPO | Attending: Internal Medicine | Admitting: Internal Medicine

## 2020-05-25 DIAGNOSIS — I110 Hypertensive heart disease with heart failure: Secondary | ICD-10-CM | POA: Diagnosis present

## 2020-05-25 DIAGNOSIS — I5031 Acute diastolic (congestive) heart failure: Secondary | ICD-10-CM | POA: Diagnosis present

## 2020-05-25 DIAGNOSIS — Z950 Presence of cardiac pacemaker: Secondary | ICD-10-CM

## 2020-05-25 DIAGNOSIS — J90 Pleural effusion, not elsewhere classified: Secondary | ICD-10-CM

## 2020-05-25 DIAGNOSIS — J398 Other specified diseases of upper respiratory tract: Secondary | ICD-10-CM | POA: Diagnosis present

## 2020-05-25 DIAGNOSIS — B9562 Methicillin resistant Staphylococcus aureus infection as the cause of diseases classified elsewhere: Secondary | ICD-10-CM | POA: Diagnosis present

## 2020-05-25 DIAGNOSIS — I10 Essential (primary) hypertension: Secondary | ICD-10-CM | POA: Diagnosis present

## 2020-05-25 DIAGNOSIS — R0902 Hypoxemia: Secondary | ICD-10-CM

## 2020-05-25 DIAGNOSIS — J189 Pneumonia, unspecified organism: Secondary | ICD-10-CM | POA: Diagnosis present

## 2020-05-25 DIAGNOSIS — N400 Enlarged prostate without lower urinary tract symptoms: Secondary | ICD-10-CM | POA: Diagnosis present

## 2020-05-25 DIAGNOSIS — J9601 Acute respiratory failure with hypoxia: Principal | ICD-10-CM | POA: Diagnosis present

## 2020-05-25 DIAGNOSIS — Z515 Encounter for palliative care: Secondary | ICD-10-CM | POA: Diagnosis not present

## 2020-05-25 DIAGNOSIS — J9811 Atelectasis: Secondary | ICD-10-CM | POA: Diagnosis present

## 2020-05-25 DIAGNOSIS — F1027 Alcohol dependence with alcohol-induced persisting dementia: Secondary | ICD-10-CM | POA: Diagnosis present

## 2020-05-25 DIAGNOSIS — F0390 Unspecified dementia without behavioral disturbance: Secondary | ICD-10-CM | POA: Diagnosis present

## 2020-05-25 DIAGNOSIS — E876 Hypokalemia: Secondary | ICD-10-CM | POA: Diagnosis present

## 2020-05-25 DIAGNOSIS — F10230 Alcohol dependence with withdrawal, uncomplicated: Secondary | ICD-10-CM | POA: Diagnosis present

## 2020-05-25 DIAGNOSIS — Z7901 Long term (current) use of anticoagulants: Secondary | ICD-10-CM

## 2020-05-25 DIAGNOSIS — Y95 Nosocomial condition: Secondary | ICD-10-CM | POA: Diagnosis present

## 2020-05-25 DIAGNOSIS — Z79899 Other long term (current) drug therapy: Secondary | ICD-10-CM

## 2020-05-25 DIAGNOSIS — R0602 Shortness of breath: Secondary | ICD-10-CM

## 2020-05-25 DIAGNOSIS — Z6835 Body mass index (BMI) 35.0-35.9, adult: Secondary | ICD-10-CM

## 2020-05-25 DIAGNOSIS — R061 Stridor: Secondary | ICD-10-CM | POA: Diagnosis present

## 2020-05-25 DIAGNOSIS — E872 Acidosis: Secondary | ICD-10-CM | POA: Diagnosis present

## 2020-05-25 DIAGNOSIS — R41841 Cognitive communication deficit: Secondary | ICD-10-CM | POA: Diagnosis present

## 2020-05-25 DIAGNOSIS — Z87891 Personal history of nicotine dependence: Secondary | ICD-10-CM

## 2020-05-25 DIAGNOSIS — R7881 Bacteremia: Secondary | ICD-10-CM | POA: Diagnosis present

## 2020-05-25 DIAGNOSIS — Z66 Do not resuscitate: Secondary | ICD-10-CM | POA: Diagnosis not present

## 2020-05-25 DIAGNOSIS — R32 Unspecified urinary incontinence: Secondary | ICD-10-CM | POA: Diagnosis present

## 2020-05-25 DIAGNOSIS — M869 Osteomyelitis, unspecified: Secondary | ICD-10-CM | POA: Diagnosis present

## 2020-05-25 DIAGNOSIS — G9341 Metabolic encephalopathy: Secondary | ICD-10-CM | POA: Diagnosis present

## 2020-05-25 DIAGNOSIS — Z20822 Contact with and (suspected) exposure to covid-19: Secondary | ICD-10-CM | POA: Diagnosis present

## 2020-05-25 DIAGNOSIS — I480 Paroxysmal atrial fibrillation: Secondary | ICD-10-CM | POA: Diagnosis present

## 2020-05-25 DIAGNOSIS — E1169 Type 2 diabetes mellitus with other specified complication: Secondary | ICD-10-CM | POA: Diagnosis present

## 2020-05-25 DIAGNOSIS — E11621 Type 2 diabetes mellitus with foot ulcer: Secondary | ICD-10-CM | POA: Diagnosis present

## 2020-05-25 DIAGNOSIS — I509 Heart failure, unspecified: Secondary | ICD-10-CM

## 2020-05-25 DIAGNOSIS — E785 Hyperlipidemia, unspecified: Secondary | ICD-10-CM | POA: Diagnosis present

## 2020-05-25 DIAGNOSIS — L97509 Non-pressure chronic ulcer of other part of unspecified foot with unspecified severity: Secondary | ICD-10-CM | POA: Diagnosis present

## 2020-05-25 DIAGNOSIS — D638 Anemia in other chronic diseases classified elsewhere: Secondary | ICD-10-CM | POA: Diagnosis present

## 2020-05-25 DIAGNOSIS — I358 Other nonrheumatic aortic valve disorders: Secondary | ICD-10-CM | POA: Diagnosis present

## 2020-05-25 DIAGNOSIS — Z7189 Other specified counseling: Secondary | ICD-10-CM | POA: Diagnosis not present

## 2020-05-25 HISTORY — DX: Methicillin resistant Staphylococcus aureus infection as the cause of diseases classified elsewhere: B95.62

## 2020-05-25 HISTORY — DX: Cognitive communication deficit: R41.841

## 2020-05-25 HISTORY — DX: Other abnormalities of gait and mobility: R26.89

## 2020-05-25 HISTORY — DX: Benign prostatic hyperplasia without lower urinary tract symptoms: N40.0

## 2020-05-25 HISTORY — DX: Muscle weakness (generalized): M62.81

## 2020-05-25 HISTORY — DX: Metabolic encephalopathy: G93.41

## 2020-05-25 HISTORY — DX: Unspecified dementia, unspecified severity, without behavioral disturbance, psychotic disturbance, mood disturbance, and anxiety: F03.90

## 2020-05-25 HISTORY — DX: COVID-19: U07.1

## 2020-05-25 HISTORY — DX: Unsteadiness on feet: R26.81

## 2020-05-25 HISTORY — DX: Bacteremia: R78.81

## 2020-05-25 LAB — CBC WITH DIFFERENTIAL/PLATELET
Abs Immature Granulocytes: 0.14 10*3/uL — ABNORMAL HIGH (ref 0.00–0.07)
Basophils Absolute: 0 10*3/uL (ref 0.0–0.1)
Basophils Relative: 0 %
Eosinophils Absolute: 0 10*3/uL (ref 0.0–0.5)
Eosinophils Relative: 0 %
HCT: 37.7 % — ABNORMAL LOW (ref 39.0–52.0)
Hemoglobin: 11.8 g/dL — ABNORMAL LOW (ref 13.0–17.0)
Immature Granulocytes: 1 %
Lymphocytes Relative: 3 %
Lymphs Abs: 0.5 10*3/uL — ABNORMAL LOW (ref 0.7–4.0)
MCH: 28.7 pg (ref 26.0–34.0)
MCHC: 31.3 g/dL (ref 30.0–36.0)
MCV: 91.7 fL (ref 80.0–100.0)
Monocytes Absolute: 1.6 10*3/uL — ABNORMAL HIGH (ref 0.1–1.0)
Monocytes Relative: 9 %
Neutro Abs: 16.3 10*3/uL — ABNORMAL HIGH (ref 1.7–7.7)
Neutrophils Relative %: 87 %
Platelets: 250 10*3/uL (ref 150–400)
RBC: 4.11 MIL/uL — ABNORMAL LOW (ref 4.22–5.81)
RDW: 14.1 % (ref 11.5–15.5)
WBC: 18.6 10*3/uL — ABNORMAL HIGH (ref 4.0–10.5)
nRBC: 0 % (ref 0.0–0.2)

## 2020-05-25 LAB — LACTIC ACID, PLASMA
Lactic Acid, Venous: 3.6 mmol/L (ref 0.5–1.9)
Lactic Acid, Venous: 3.1 mmol/L (ref 0.5–1.9)

## 2020-05-25 LAB — COMPREHENSIVE METABOLIC PANEL
ALT: 17 U/L (ref 0–44)
AST: 25 U/L (ref 15–41)
Albumin: 3.1 g/dL — ABNORMAL LOW (ref 3.5–5.0)
Alkaline Phosphatase: 78 U/L (ref 38–126)
Anion gap: 15 (ref 5–15)
BUN: 14 mg/dL (ref 8–23)
CO2: 20 mmol/L — ABNORMAL LOW (ref 22–32)
Calcium: 9.1 mg/dL (ref 8.9–10.3)
Chloride: 106 mmol/L (ref 98–111)
Creatinine, Ser: 1.01 mg/dL (ref 0.61–1.24)
GFR, Estimated: >60 mL/min (ref >60)
Glucose, Bld: 153 mg/dL — ABNORMAL HIGH (ref 70–99)
Potassium: 3 mmol/L — ABNORMAL LOW (ref 3.5–5.1)
Sodium: 141 mmol/L (ref 135–145)
Total Bilirubin: 1.8 mg/dL — ABNORMAL HIGH (ref 0.3–1.2)
Total Protein: 6.7 g/dL (ref 6.5–8.1)

## 2020-05-25 LAB — BRAIN NATRIURETIC PEPTIDE: B Natriuretic Peptide: 361.2 pg/mL — ABNORMAL HIGH (ref 0.0–100.0)

## 2020-05-25 LAB — TROPONIN I (HIGH SENSITIVITY)
Troponin I (High Sensitivity): 46 ng/L — ABNORMAL HIGH (ref <18)
Troponin I (High Sensitivity): 95 ng/L — ABNORMAL HIGH (ref <18)

## 2020-05-25 LAB — VANCOMYCIN, RANDOM: Vancomycin Rm: 17

## 2020-05-25 MED ORDER — CHLORHEXIDINE GLUCONATE CLOTH 2 % EX PADS
6.0000 | MEDICATED_PAD | Freq: Every day | CUTANEOUS | Status: DC
  Administered 2020-05-26 – 2020-06-04 (×10): 6 via TOPICAL

## 2020-05-25 MED ORDER — BISACODYL 5 MG PO TBEC: 5.0000 mg | DELAYED_RELEASE_TABLET | Freq: Every day | ORAL | Status: DC | PRN

## 2020-05-25 MED ORDER — ALTEPLASE 2 MG IJ SOLR
2.0000 mg | Freq: Once | INTRAMUSCULAR | Status: AC
  Administered 2020-05-25: 2 mg
  Filled 2020-05-25: qty 2

## 2020-05-25 MED ORDER — ACETAMINOPHEN 325 MG PO TABS
650.0000 mg | ORAL_TABLET | Freq: Four times a day (QID) | ORAL | Status: DC | PRN
  Administered 2020-05-25 – 2020-06-02 (×3): 650 mg via ORAL
  Filled 2020-05-25 (×3): qty 2

## 2020-05-25 MED ORDER — POLYETHYLENE GLYCOL 3350 17 G PO PACK: 17.0000 g | PACK | Freq: Every day | ORAL | Status: DC | PRN

## 2020-05-25 MED ORDER — SODIUM CHLORIDE 0.9 % IV SOLN: 250.0000 mL | INTRAVENOUS | Status: DC | PRN

## 2020-05-25 MED ORDER — IOHEXOL 300 MG/ML  SOLN
75.0000 mL | Freq: Once | INTRAMUSCULAR | Status: AC | PRN
  Administered 2020-05-25: 75 mL via INTRAVENOUS

## 2020-05-25 MED ORDER — ACETAMINOPHEN 650 MG RE SUPP: 650.0000 mg | Freq: Four times a day (QID) | RECTAL | Status: DC | PRN

## 2020-05-25 MED ORDER — POTASSIUM CHLORIDE CRYS ER 10 MEQ PO TBCR
30.0000 meq | EXTENDED_RELEASE_TABLET | ORAL | Status: AC
Start: 2020-05-25 — End: 2020-05-25
  Administered 2020-05-25 (×2): 30 meq via ORAL
  Filled 2020-05-25 (×2): qty 1

## 2020-05-25 MED ORDER — TAMSULOSIN HCL 0.4 MG PO CAPS
0.4000 mg | ORAL_CAPSULE | Freq: Every day | ORAL | Status: DC
  Administered 2020-05-26 – 2020-06-05 (×10): 0.4 mg via ORAL
  Filled 2020-05-25 (×13): qty 1

## 2020-05-25 MED ORDER — SODIUM CHLORIDE 0.9 % IV SOLN
2.0000 g | Freq: Three times a day (TID) | INTRAVENOUS | Status: DC
  Administered 2020-05-25 – 2020-05-28 (×8): 2 g via INTRAVENOUS
  Filled 2020-05-25 (×8): qty 2

## 2020-05-25 MED ORDER — SODIUM CHLORIDE 0.9% FLUSH
10.0000 mL | Freq: Two times a day (BID) | INTRAVENOUS | Status: DC
  Administered 2020-05-25 – 2020-06-04 (×5): 10 mL

## 2020-05-25 MED ORDER — SODIUM CHLORIDE 0.9 % IV SOLN
2.0000 g | Freq: Once | INTRAVENOUS | Status: AC
  Administered 2020-05-25: 2 g via INTRAVENOUS
  Filled 2020-05-25: qty 2

## 2020-05-25 MED ORDER — VANCOMYCIN HCL 1000 MG/200ML IV SOLN
1000.0000 mg | Freq: Two times a day (BID) | INTRAVENOUS | Status: AC
  Administered 2020-05-26 – 2020-05-31 (×12): 1000 mg via INTRAVENOUS
  Filled 2020-05-25 (×14): qty 200

## 2020-05-25 MED ORDER — SODIUM CHLORIDE 0.9% FLUSH
10.0000 mL | INTRAVENOUS | Status: DC | PRN
  Administered 2020-05-28 (×2): 10 mL

## 2020-05-25 MED ORDER — MAGNESIUM OXIDE 400 (241.3 MG) MG PO TABS
400.0000 mg | ORAL_TABLET | Freq: Every day | ORAL | Status: DC
  Administered 2020-05-25 – 2020-06-05 (×11): 400 mg via ORAL
  Filled 2020-05-25 (×13): qty 1

## 2020-05-25 MED ORDER — THIAMINE HCL 100 MG PO TABS
100.0000 mg | ORAL_TABLET | Freq: Every morning | ORAL | Status: DC
  Administered 2020-05-26 – 2020-06-05 (×9): 100 mg via ORAL
  Filled 2020-05-25 (×11): qty 1

## 2020-05-25 MED ORDER — SODIUM CHLORIDE 0.9% FLUSH
3.0000 mL | Freq: Two times a day (BID) | INTRAVENOUS | Status: DC
  Administered 2020-05-25 – 2020-06-03 (×8): 3 mL via INTRAVENOUS

## 2020-05-25 MED ORDER — LACTATED RINGERS IV BOLUS
1000.0000 mL | Freq: Once | INTRAVENOUS | Status: AC
  Administered 2020-05-25: 1000 mL via INTRAVENOUS

## 2020-05-25 MED ORDER — SODIUM CHLORIDE 0.9% FLUSH: 3.0000 mL | INTRAVENOUS | Status: DC | PRN

## 2020-05-25 MED ORDER — DOCUSATE SODIUM 100 MG PO CAPS
100.0000 mg | ORAL_CAPSULE | Freq: Two times a day (BID) | ORAL | Status: DC
  Administered 2020-05-25 – 2020-05-26 (×3): 100 mg via ORAL
  Filled 2020-05-25 (×4): qty 1

## 2020-05-25 MED ORDER — OXYBUTYNIN CHLORIDE ER 5 MG PO TB24
5.0000 mg | ORAL_TABLET | Freq: Every day | ORAL | Status: DC
  Administered 2020-05-25 – 2020-06-04 (×11): 5 mg via ORAL
  Filled 2020-05-25 (×13): qty 1

## 2020-05-25 MED ORDER — LISINOPRIL 20 MG PO TABS
20.0000 mg | ORAL_TABLET | Freq: Every day | ORAL | Status: DC
  Administered 2020-05-25 – 2020-06-01 (×8): 20 mg via ORAL
  Filled 2020-05-25 (×8): qty 1

## 2020-05-25 MED ORDER — HYDROCHLOROTHIAZIDE 25 MG PO TABS: 12.5000 mg | ORAL_TABLET | Freq: Every day | ORAL | Status: DC

## 2020-05-25 MED ORDER — RIVAROXABAN 10 MG PO TABS
20.0000 mg | ORAL_TABLET | Freq: Every day | ORAL | Status: DC
  Administered 2020-05-25 – 2020-06-04 (×11): 20 mg via ORAL
  Filled 2020-05-25 (×6): qty 2
  Filled 2020-05-25: qty 1
  Filled 2020-05-25 (×5): qty 2

## 2020-05-25 NOTE — ED Provider Notes (Signed)
[facility] EMERGENCY DEPARTMENT Provider Note   CSN: [id number] Arrival date & time: 05/25/20  1000     History Chief Complaint  Patient presents with  . Respiratory Distress    BRANDO TAVES is a 87 y.o. male. Level 5 caveat due to altered mental status. HPI Patient brought in from [facility].  Reportedly had acute onset of shortness of breath and acute onset of edema of bilateral lower legs.  Reportedly had cyanosis and sats in the 60s although EMS was unsure of the accuracy of this.  Reportedly had stridor.  Given DuoNeb epinephrine IM and racemic epi.  Sats reportedly improved and was on CPAP upon arrival.  Patient really cannot provide much history.  States he is at [facility]. 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  Reportedly had MRSA bacteremia also.    Past Medical History:  Diagnosis Date  . Bacteremia   . Benign prostatic hyperplasia without lower urinary tract symptoms   . Cognitive communication deficit   . COVID-19   . Dementia (HCC)   . Hypertension   . Metabolic encephalopathy   . Methicillin resistant Staphylococcus aureus infection as the cause of diseases classified elsewhere   . Muscle weakness (generalized)   . Other abnormalities of gait and mobility   . Unsteadiness on feet     Patient Active Problem List   Diagnosis Date Noted  . Diabetic foot ulcer (HCC)   . MRSA bacteremia 04/20/2020  . Alcohol dependence with uncomplicated withdrawal (HCC) 04/20/2020  . Pacemaker infection (HCC)   . Lactic acidosis 04/17/2020  . Essential hypertension 04/17/2020  . Benign prostatic hyperplasia 04/17/2020  . Dementia without behavioral disturbance (HCC) 04/17/2020  . Presence of cardiac pacemaker 04/17/2020  . COVID-19 virus infection 04/17/2020  . Hyperglycemia 04/17/2020  . Acute cystitis without hematuria 04/17/2020  . Sepsis (HCC) 04/16/2020    Past Surgical History:   Procedure Laterality Date  . PACEMAKER INSERTION         No family history on file.  Social History   Tobacco Use  . Smoking status: Former Games developer  . Smokeless tobacco: Never Used  Substance Use Topics  . Alcohol use: No  . Drug use: No    Home Medications Prior to Admission medications   Medication Sig Start Date End Date Taking? Authorizing Provider  Cholecalciferol (VITAMIN D-3) 25 MCG (1000 UT) CAPS Take 1,000 Units by mouth in the morning.   Yes [provider]  Docusate Sodium 100 MG capsule Take 100 mg by mouth 2 (two) times daily.   Yes [provider]  folic acid (FOLVITE) 1 MG tablet Take 1 mg by mouth in the morning.   Yes [provider]  hydrochlorothiazide (HYDRODIURIL) 12.5 MG tablet Take 12.5 mg by mouth daily. 05/07/20  Yes [provider]  Lactobacillus (PROBIOTIC ACIDOPHILUS) CAPS Take 1 capsule by mouth daily.   Yes [provider]  lisinopril (ZESTRIL) 20 MG tablet Take 20 mg by mouth at bedtime. 05/20/20  Yes [provider]  magnesium oxide (MAG-OX) 400 MG tablet Take 400 mg by mouth daily.   Yes [provider]  Multiple Vitamins-Minerals (SENIOR VITES PO) Take 1 tablet by mouth daily with breakfast.   Yes [provider]  oxybutynin (DITROPAN-XL) 5 MG 24 hr tablet Take 5 mg by mouth at bedtime. 04/29/20  Yes [provider]  rivaroxaban (XARELTO) 20 MG TABS tablet Take  20 mg by mouth at bedtime.   Yes [provider]  tamsulosin (FLOMAX) 0.4 MG CAPS Take 0.4 mg by mouth daily after breakfast.   Yes [provider]  thiamine 100 MG tablet Take 100 mg by mouth in the morning.   Yes [provider]  vancomycin HCl (VANCOREADY) 1000 MG/200ML SOLN Inject 1,000 mg into the vein 2 (two) times daily. 05/22/20  Yes [provider]    Allergies    Patient has no known allergies.  Review of Systems   Review of Systems  Unable to perform ROS: Mental  status change    Physical Exam Updated Vital Signs BP (!) 153/97   Pulse (!) 59   Temp (!) 100.4 F (38 C) (Rectal)   Resp (!) 28   Ht 5\' 10"  (1.778 m)   Wt 108.1 kg   SpO2 99%   BMI 34.20 kg/m   Physical Exam Vitals reviewed.  HENT:     Head: Atraumatic.  Eyes:     General: No scleral icterus. Cardiovascular:     Rate and Rhythm: Regular rhythm.  Pulmonary:     Comments: Harsh breath sounds, particularly left base. Abdominal:     Tenderness: There is no abdominal tenderness.  Musculoskeletal:     Cervical back: Neck supple.     Comments: Pitting edema bilateral lower extremities.  Does have wound on left great toe.  Skin:    General: Skin is warm.  Neurological:     Mental Status: He is alert.     Comments: Awake and answers questions but is confused.     ED Results / Procedures / Treatments   Labs (all labs ordered are listed, but only abnormal results are displayed) Labs Reviewed  COMPREHENSIVE METABOLIC PANEL - Abnormal; Notable for the following components:      Result Value   Potassium 3.0 (*)    CO2 20 (*)    Glucose, Bld 153 (*)    Albumin 3.1 (*)    Total Bilirubin 1.8 (*)    All other components within normal limits  BRAIN NATRIURETIC PEPTIDE - Abnormal; Notable for the following components:   B Natriuretic Peptide 361.2 (*)    All other components within normal limits  CBC WITH DIFFERENTIAL/PLATELET - Abnormal; Notable for the following components:   WBC 18.6 (*)    RBC 4.11 (*)    Hemoglobin 11.8 (*)    HCT 37.7 (*)    Neutro Abs 16.3 (*)    Lymphs Abs 0.5 (*)    Monocytes Absolute 1.6 (*)    Abs Immature Granulocytes 0.14 (*)    All other components within normal limits  LACTIC ACID, PLASMA - Abnormal; Notable for the following components:   Lactic Acid, Venous 3.6 (*)    All other components within normal limits  LACTIC ACID, PLASMA - Abnormal; Notable for the following components:   Lactic Acid, Venous 3.1 (*)    All other components  within normal limits  TROPONIN I (HIGH SENSITIVITY) - Abnormal; Notable for the following components:   Troponin I (High Sensitivity) 46 (*)    All other components within normal limits  TROPONIN I (HIGH SENSITIVITY) - Abnormal; Notable for the following components:   Troponin I (High Sensitivity) 95 (*)    All other components within normal limits  CULTURE, BLOOD (ROUTINE X 2)  CULTURE, BLOOD (ROUTINE X 2)  VANCOMYCIN, RANDOM    EKG EKG Interpretation  Date/Time:  Monday May 25 2020 10:10:12 EDT Ventricular  Rate:  86 PR Interval:    QRS Duration: 156 QT Interval:  457 QTC Calculation: 547 R Axis:   -67 Text Interpretation: ventricular-paced complexes Multiple ventricular premature complexes Left bundle branch block Confirmed by Benjiman Core [phone]) on 05/25/2020 10:16:21 AM   Radiology DG Chest Portable 1 View  Result Date: 05/25/2020 CLINICAL DATA:  Shortness of breath. EXAM: PORTABLE CHEST 1 VIEW COMPARISON:  April 23, 2020. FINDINGS: Stable cardiomegaly. Left-sided pacemaker is unchanged in position. Right-sided PICC line is unchanged. No pneumothorax is noted. Mild bibasilar subsegmental atelectasis or possibly pulmonary edema is noted. No significant pleural effusion is noted. Bony thorax is unremarkable. IMPRESSION: Mild bibasilar subsegmental atelectasis or possibly pulmonary edema is noted. Electronically Signed   By: Lupita Raider M.D.   On: 05/25/2020 10:35    Procedures Procedures   Medications Ordered in ED Medications  sodium chloride flush (NS) 0.9 % injection 10-40 mL (has no administration in time range)  sodium chloride flush (NS) 0.9 % injection 10-40 mL (has no administration in time range)  Chlorhexidine Gluconate Cloth 2 % PADS 6 each (has no administration in time range)  alteplase (CATHFLO ACTIVASE) injection 2 mg (has no administration in time range)  vancomycin (VANCOREADY) IVPB 1000 mg/200 mL (has no administration in time range)  ceFEPIme  (MAXIPIME) 2 g in sodium chloride 0.9 % 100 mL IVPB (has no administration in time range)  potassium chloride SA (KLOR-CON) CR tablet 30 mEq (has no administration in time range)  ceFEPIme (MAXIPIME) 2 g in sodium chloride 0.9 % 100 mL IVPB (0 g Intravenous Stopped 05/25/20 1158)  lactated ringers bolus 1,000 mL (0 mLs Intravenous Stopped 05/25/20 1318)    ED Course  I have reviewed the triage vital signs and the nursing notes.  Pertinent labs & imaging results that were available during my care of the patient were reviewed by me and considered in my medical decision making (see chart for details).    MDM Rules/Calculators/A&P                          Patient brought in for hypoxia from nursing home.  Reportedly sats in the 60s.  Improved upon arrival.  Now only on nasal cannula.  Appears to have fair amount of edema on his legs.  Appears to be somewhat volume overloaded also.  BNP is elevated.  Is on IV antibiotics for MRSA bacteremia.  Although it appears as if they may have been having some difficulty getting trough levels. Chest x-ray done and showed what may be some volume overload.  Has potential edema on x-ray.  However rectal temperature is 100.4 and white count has increased from prior.  Initial lactic acid somewhat elevated but this could be secondary to the hypoxia he had been experiencing and not necessarily a sepsis/severe sepsis.  Nevertheless I did give the first dose of broader spectrum antibiotics to cover potential lung disease and foot infection.  Initial lactic acid elevated but decreased on recheck.  Foot still looks reassuring. Troponin mildly elevated and has increased from 40-90.  Could also be secondary to the hypoxia.  Patient has a paced rhythm However with the hypoxia worsening edema and elevating white count I feels patient benefit from admission to the hospital.  Will discuss with hospitalist since he got off the service just a month ago.  CRITICAL CARE Performed by:  Benjiman Core Total critical care time: 30 minutes Critical care time was exclusive of separately  billable procedures and treating other patients. Critical care was necessary to treat or prevent imminent or life-threatening deterioration. Critical care was time spent personally by me on the following activities: development of treatment plan with patient and/or surrogate as well as nursing, discussions with consultants, evaluation of patient's response to treatment, examination of patient, obtaining history from patient or surrogate, ordering and performing treatments and interventions, ordering and review of laboratory studies, ordering and review of radiographic studies, pulse oximetry and re-evaluation of patient's condition.  Final Clinical Impression(s) / ED Diagnoses Final diagnoses:  Hypoxia  Congestive heart failure, unspecified HF chronicity, unspecified heart failure type [facility])    Rx / DC Orders ED Discharge Orders    None       Benjiman Core, MD 05/25/20 1401

## 2020-05-25 NOTE — ED Notes (Signed)
Patient transported to CT 

## 2020-05-25 NOTE — ED Triage Notes (Addendum)
Pt arrived via [facility] from [facility] for respiratory distress. Per EMS pt suddenly became SOB and lower extremity edema bilat. Per EMS NH staff gave duoneb. EMS gave duoneb, epi IM and racemic 11.2 mg. Per ems Pt's Sa02 was in the 60's on RA. Pt arrived on c-pap. Per EMS pt has stridor lung sounds. Pt is A&Ox1 to self only. Pt's color WNL. Pt is tachypneic.

## 2020-05-25 NOTE — Progress Notes (Signed)
VAST RN to patient's bedside to remove Cath-Flo from patient's PICC. Patient was being returned to room from CT. Upon assessing PICC, "Do not use" sticker had been removed from PICC and needleless cap had been placed.  New needless cap placed and good blood return obtained before flushing line with NS and clamping. Reported results to off going and on coming ER nurses. Also advised that PICC dressing needs to be changed and Biopatch placed.

## 2020-05-25 NOTE — ED Notes (Signed)
Pt unable to sign MSE form due to dementia

## 2020-05-25 NOTE — H&P (Signed)
History and Physical:    Adam Conner   FMB:[id number] DOB: 02-18-1934 DOA: 05/25/2020  Referring MD/provider: Dr. Rubin Payor PCP: Patient, No Pcp Per (Inactive)   Patient coming from: [facility]  Chief Complaint: Stridor   History of Present Illness:   Adam Conner is an 87 y.o. male with PMH significant for recent discharge last month after treatment of MRSA bacteremia secondary to OM, presently on IV vancomycin, DM2, HTN, atrial fibrillation, dementia, diabetic foot ulcer who was brought to the ED by EMS when they were called for stridor.  Patient is unable to provide a history due to his dementia.  Per ED documentation, patient was found to be stridorous by the nursing home and when EMS got there he needed to be placed on CPAP with need for racemic epi and IM epi as well as DuoNeb.  O2 sats were apparently in the mid 60s although accuracy was unclear per EMS report.  By the time the patient came to the ED, he was breathing much more comfortably and was able to be taken off of CPAP.  He is presently comfortable on 2 L nasal cannula.  Patient is unable to provide me with an accurate history or a review of systems.   ED Course:  The patient was noted to have a fever 100.7, was satting at 96% on 4 L nasal cannula, was hypertensive and confused.  Laboratory data was notable for potassium of 3.0 and leukocytosis at 18 with left shift.  Chest x-ray was suggestive of some pulmonary edema and mild bibasilar atelectasis.  Patient was started on vancomycin and cefepime and called in for admission.  ROS:   ROS   Review of Systems: Patient unable to provide  Past Medical History:   Past Medical History:  Diagnosis Date  . Bacteremia   . Benign prostatic hyperplasia without lower urinary tract symptoms   . Cognitive communication deficit   . COVID-19   . Dementia (HCC)   . Hypertension   . Metabolic encephalopathy   . Methicillin resistant Staphylococcus aureus infection as the  cause of diseases classified elsewhere   . Muscle weakness (generalized)   . Other abnormalities of gait and mobility   . Unsteadiness on feet     Past Surgical History:   Past Surgical History:  Procedure Laterality Date  . PACEMAKER INSERTION      Social History:   Social History   Socioeconomic History  . Marital status: Single    Spouse name: Not on file  . Number of children: Not on file  . Years of education: Not on file  . Highest education level: Not on file  Occupational History  . Not on file  Tobacco Use  . Smoking status: Former Games developer  . Smokeless tobacco: Never Used  Substance and Sexual Activity  . Alcohol use: No  . Drug use: No  . Sexual activity: Not on file  Other Topics Concern  . Not on file  Social History Narrative  . Not on file   Social Determinants of Health   Financial Resource Strain: Not on file  Food Insecurity: Not on file  Transportation Needs: Not on file  Physical Activity: Not on file  Stress: Not on file  Social Connections: Not on file  Intimate Partner Violence: Not on file    Allergies   Patient has no known allergies.  Family history:   No family history on file.  Current Medications:   Prior to Admission medications  Medication Sig Start Date End Date Taking? Authorizing Provider  Cholecalciferol (VITAMIN D-3) 25 MCG (1000 UT) CAPS Take 1,000 Units by mouth in the morning.   Yes [provider]  Docusate Sodium 100 MG capsule Take 100 mg by mouth 2 (two) times daily.   Yes [provider]  folic acid (FOLVITE) 1 MG tablet Take 1 mg by mouth in the morning.   Yes [provider]  hydrochlorothiazide (HYDRODIURIL) 12.5 MG tablet Take 12.5 mg by mouth daily. 05/07/20  Yes [provider]  Lactobacillus (PROBIOTIC ACIDOPHILUS) CAPS Take 1 capsule by mouth daily.   Yes [provider]  lisinopril (ZESTRIL) 20 MG tablet Take 20 mg by mouth at bedtime. 05/20/20  Yes [provider]  magnesium oxide (MAG-OX) 400 MG tablet Take 400 mg by mouth daily.   Yes [provider]  Multiple Vitamins-Minerals (SENIOR VITES PO) Take 1 tablet by mouth daily with breakfast.   Yes [provider]  oxybutynin (DITROPAN-XL) 5 MG 24 hr tablet Take 5 mg by mouth at bedtime. 04/29/20  Yes [provider]  rivaroxaban (XARELTO) 20 MG TABS tablet Take 20 mg by mouth at bedtime.   Yes [provider]  tamsulosin (FLOMAX) 0.4 MG CAPS Take 0.4 mg by mouth daily after breakfast.   Yes [provider]  thiamine 100 MG tablet Take 100 mg by mouth in the morning.   Yes [provider]  vancomycin HCl (VANCOREADY) 1000 MG/200ML SOLN Inject 1,000 mg into the vein 2 (two) times daily. 05/22/20  Yes [provider]    Physical Exam:   Vitals:   05/25/20 1230 05/25/20 1300 05/25/20 1330 05/25/20 1449  BP: (!) 152/64 (!) 160/67 (!) 153/97 (!) 145/69  Pulse: (!) 59 (!) 59 (!) 59 76  Resp: (!) 22 (!) 26 (!) 28 (!) 24  Temp:      TempSrc:      SpO2: 96% 96% 99% 93%  Weight:      Height:         Physical Exam: Blood pressure (!) 145/69, pulse 76, temperature (!) 100.4 F (38 C), temperature source Rectal, resp. rate (!) 24, height 5\' 10"  (1.778 m), weight 108.1 kg, SpO2 93 %. Gen: Obese confused gentleman lying in bed flat with unlabored tachypnea.  Patient keeps asking me when my husband died. Eyes: sclera anicteric, conjuctiva mildly injected bilaterally CVS: S1-S2, regulary, no gallops Respiratory: Patient has reasonable air entry bilaterally with fine high-pitched wheezes in upper lung fields which may or may not be transmitted from upper respiratory sounds. GI: Obese, NABS, soft, NT  LE: Bilateral edema, right greater than left, 2+ Neuro: Grossly nonfocal although he does have some garbled speech.  Moving all extremities.  Answering questions appropriately. Psych: Significant dementia  Data Review:    Labs: Basic  Metabolic Panel: Recent Labs  Lab 05/25/20 1009  NA 141  K 3.0*  CL 106  CO2 20*  GLUCOSE 153*  BUN 14  CREATININE 1.01  CALCIUM 9.1   Liver Function Tests: Recent Labs  Lab 05/25/20 1009  AST 25  ALT 17  ALKPHOS 78  BILITOT 1.8*  PROT 6.7  ALBUMIN 3.1*   No results for input(s): LIPASE, AMYLASE in the last 168 hours. No results for input(s): AMMONIA in the last 168 hours. CBC: Recent Labs  Lab 05/25/20 1009  WBC 18.6*  NEUTROABS 16.3*  HGB 11.8*  HCT 37.7*  MCV 91.7  PLT 250   Cardiac Enzymes: No  results for input(s): CKTOTAL, CKMB, CKMBINDEX, TROPONINI in the last 168 hours.  BNP (last 3 results) No results for input(s): PROBNP in the last 8760 hours. CBG: No results for input(s): GLUCAP in the last 168 hours.  Urinalysis    Component Value Date/Time   COLORURINE AMBER (A) 04/16/2020 2230   APPEARANCEUR CLOUDY (A) 04/16/2020 2230   LABSPEC 1.017 04/16/2020 2230   PHURINE 8.0 04/16/2020 2230   GLUCOSEU NEGATIVE 04/16/2020 2230   HGBUR SMALL (A) 04/16/2020 2230   BILIRUBINUR NEGATIVE 04/16/2020 2230   KETONESUR NEGATIVE 04/16/2020 2230   PROTEINUR 30 (A) 04/16/2020 2230   UROBILINOGEN 4.0 (H) 12/15/2008 0600   NITRITE NEGATIVE 04/16/2020 2230   LEUKOCYTESUR LARGE (A) 04/16/2020 2230      Radiographic Studies: DG Chest Portable 1 View  Result Date: 05/25/2020 CLINICAL DATA:  Shortness of breath. EXAM: PORTABLE CHEST 1 VIEW COMPARISON:  April 23, 2020. FINDINGS: Stable cardiomegaly. Left-sided pacemaker is unchanged in position. Right-sided PICC line is unchanged. No pneumothorax is noted. Mild bibasilar subsegmental atelectasis or possibly pulmonary edema is noted. No significant pleural effusion is noted. Bony thorax is unremarkable. IMPRESSION: Mild bibasilar subsegmental atelectasis or possibly pulmonary edema is noted. Electronically Signed   By: Lupita Raider M.D.   On: 05/25/2020 10:35    EKG: Independently reviewed.  Paced rhythm.   LBBB.   Assessment/Plan:   Principal Problem:   Stridor Active Problems:   Essential hypertension   Benign prostatic hyperplasia   Dementia without behavioral disturbance (HCC)   Presence of cardiac pacemaker   MRSA bacteremia   Alcohol dependence with uncomplicated withdrawal (HCC)   Diabetic foot ulcer (HCC)  87 year old man with dementia atrial fibrillation is admitted with hypoxia and reported stridor now doing well on 2 L nasal cannula.  Hypoxia with reported stridor Now doing well on 2 L nasal cannula He does have fine high-pitched wheezes on exam which may or may not be from upper respiratory area Patient does seem to have some pulmonary edema on chest x-ray. However given fever and leukocytosis, I am concerned for pneumonia Will order noncontrast chest CT We will also order neck CT to evaluate for physical causes of stridor. Continue vancomycin and cefepime for presumed HAP pending results of chest CT.  Possible pulmonary edema Patient is only on HCTZ at home as a diuretic I suspect patient's hypoxia is likely secondary to pneumonia so will not start diuresis at present. If chest CT is negative for pneumonia, could consider a dose of Lasix.  Hypokalemia Replete and recheck  MRSA bacteremia with diabetic foot ulcer Continue treatment with vancomycin as he has been getting as an outpatient  HTN Continue HCTZ and lisinopril  BPH Continue Flomax and oxybutynin  Atrial fibrillation Continue Xarelto Patient is paced with PPM.     Other information:   DVT prophylaxis: Xarelto ordered. Code Status: Full Family Communication: None Disposition Plan: [place] Consults called: None Admission status: Inpatient  Kirti Carl Tublu Juliza Machnik [facility]  If 7PM-7AM, please contact night-coverage [email or web address] Password [facility] 05/25/2020, 5:58 PM

## 2020-05-25 NOTE — Progress Notes (Addendum)
Pharmacy Antibiotic Note  Adam Conner is a 87 y.o. male admitted on 05/25/2020 with sepsis.  Pharmacy has been consulted for vancomycin and cefepime dosing. Scr 1.01. Baseline Scr ~ 0.9. Tmax 100.7. WBC 18.6.   Patient was on vancomycin 1000mg  q12h for MRSA bacteremia and pacemaker in place. Vancomycin intended to be given until 05/31/2020. Patient was increased to vancomycin 1000mg  q12h on 3/30 based on subtherapeutic trough. Last dose of vancomycin 1000mg  documented as given at 0243 on 4/4 via facility [facility] but spoke with facility and doses intended to be given at 0800 and 2000 so unclear if last dose was 4/3 around 2000 or truly at 0243 based on conversation with facility nurse.   Plan: Continue vancomycin 1000mg  q12h  Obtain vancomycin random to help determine when last dose was given and ensure not supratherapeutic and still obtain vancomycin random at steady state Start cefepime 2g IV q8h  Monitor renal function, cultures, and clinical progression  Height: 5\' 10"  (177.8 cm) Weight: 108.1 kg (238 lb 5.1 oz) IBW/kg (Calculated) : 73  Temp (24hrs), Avg:100.6 F (38.1 C), Min:100.4 F (38 C), Max:100.7 F (38.2 C)  Recent Labs  Lab 05/25/20 1009 05/25/20 1028  WBC 18.6*  --   CREATININE 1.01  --   LATICACIDVEN  --  3.6*    Estimated Creatinine Clearance: 67 mL/min (by C-G formula based on SCr of 1.01 mg/dL).    No Known Allergies  Antimicrobials this admission: Vancomycin prior to admission >> Cefepime 4/4 >>  Dose adjustments this admission: N/a  Microbiology results: 4/4 Bcx:   Thank you for allowing pharmacy to be a part of this patient's care.  , PharmD Clinical Pharmacist  05/25/2020 12:55 PM   Addendum: Vancomycin random level 17. Difficult to assess based on when last dose was but indicates patient is not supratherapeutic. Continue current regimen based on unknown exact last dose in order to determine if vancomycin random level is at goal and recheck  true levels.   07/25/20, PharmD Clinical Pharmacist

## 2020-05-25 NOTE — ED Notes (Signed)
Notified Dr. Rubin Payor regarding pt's late vancomycin. Pt has no IV access; PICC line is not available to be used until 1800 tonight. Messaged Dr. Rubin Payor that pt has wheezing, no PRN meds to give. Waiting for call back.

## 2020-05-25 NOTE — ED Notes (Signed)
Per IV Team, PICC line can not be used until 1800.

## 2020-05-25 NOTE — ED Notes (Signed)
Changed pt and applied a clean brief.

## 2020-05-25 NOTE — ED Notes (Signed)
Pt back from CT

## 2020-05-26 DIAGNOSIS — R7881 Bacteremia: Secondary | ICD-10-CM | POA: Diagnosis not present

## 2020-05-26 DIAGNOSIS — B9562 Methicillin resistant Staphylococcus aureus infection as the cause of diseases classified elsewhere: Secondary | ICD-10-CM

## 2020-05-26 DIAGNOSIS — J90 Pleural effusion, not elsewhere classified: Secondary | ICD-10-CM

## 2020-05-26 DIAGNOSIS — J9601 Acute respiratory failure with hypoxia: Secondary | ICD-10-CM

## 2020-05-26 DIAGNOSIS — R061 Stridor: Secondary | ICD-10-CM | POA: Diagnosis not present

## 2020-05-26 DIAGNOSIS — F0390 Unspecified dementia without behavioral disturbance: Secondary | ICD-10-CM | POA: Diagnosis not present

## 2020-05-26 DIAGNOSIS — I480 Paroxysmal atrial fibrillation: Secondary | ICD-10-CM

## 2020-05-26 DIAGNOSIS — E876 Hypokalemia: Secondary | ICD-10-CM

## 2020-05-26 DIAGNOSIS — J398 Other specified diseases of upper respiratory tract: Secondary | ICD-10-CM

## 2020-05-26 LAB — RESPIRATORY PANEL BY PCR

## 2020-05-26 LAB — BASIC METABOLIC PANEL
Anion gap: 9 (ref 5–15)
BUN: 16 mg/dL (ref 8–23)
CO2: 20 mmol/L — ABNORMAL LOW (ref 22–32)
Calcium: 8.7 mg/dL — ABNORMAL LOW (ref 8.9–10.3)
Chloride: 110 mmol/L (ref 98–111)
Creatinine, Ser: 1 mg/dL (ref 0.61–1.24)
GFR, Estimated: >60 mL/min (ref >60)
Glucose, Bld: 116 mg/dL — ABNORMAL HIGH (ref 70–99)
Potassium: 3.5 mmol/L (ref 3.5–5.1)
Sodium: 139 mmol/L (ref 135–145)

## 2020-05-26 LAB — CBC
HCT: 35 % — ABNORMAL LOW (ref 39.0–52.0)
Hemoglobin: 11.1 g/dL — ABNORMAL LOW (ref 13.0–17.0)
MCH: 29.2 pg (ref 26.0–34.0)
MCHC: 31.7 g/dL (ref 30.0–36.0)
MCV: 92.1 fL (ref 80.0–100.0)
Platelets: 207 10*3/uL (ref 150–400)
RBC: 3.8 MIL/uL — ABNORMAL LOW (ref 4.22–5.81)
RDW: 14.3 % (ref 11.5–15.5)
WBC: 13.7 10*3/uL — ABNORMAL HIGH (ref 4.0–10.5)
nRBC: 0 % (ref 0.0–0.2)

## 2020-05-26 LAB — SARS CORONAVIRUS 2 (TAT 6-24 HRS): SARS Coronavirus 2: NEGATIVE

## 2020-05-26 LAB — PROCALCITONIN: Procalcitonin: 0.3 ng/mL

## 2020-05-26 MED ORDER — FUROSEMIDE 10 MG/ML IJ SOLN
40.0000 mg | Freq: Every day | INTRAMUSCULAR | Status: DC
  Administered 2020-05-26 – 2020-05-30 (×5): 40 mg via INTRAVENOUS
  Filled 2020-05-26 (×6): qty 4

## 2020-05-26 MED ORDER — IPRATROPIUM-ALBUTEROL 0.5-2.5 (3) MG/3ML IN SOLN
3.0000 mL | Freq: Four times a day (QID) | RESPIRATORY_TRACT | Status: DC
  Administered 2020-05-26 – 2020-05-28 (×8): 3 mL via RESPIRATORY_TRACT
  Filled 2020-05-26 (×8): qty 3

## 2020-05-26 MED ORDER — ORAL CARE MOUTH RINSE
15.0000 mL | Freq: Two times a day (BID) | OROMUCOSAL | Status: DC
  Administered 2020-05-26 – 2020-06-04 (×19): 15 mL via OROMUCOSAL

## 2020-05-26 MED ORDER — METRONIDAZOLE IN NACL 5-0.79 MG/ML-% IV SOLN
500.0000 mg | Freq: Three times a day (TID) | INTRAVENOUS | Status: DC
  Administered 2020-05-26 – 2020-05-27 (×3): 500 mg via INTRAVENOUS
  Filled 2020-05-26 (×3): qty 100

## 2020-05-26 NOTE — Assessment & Plan Note (Signed)
-  No known prior history of similar.  Given his global deconditioned state, CT is showing tracheobronchomalacia which is not surprising.  Likely poor treatment candidate and will need aggressive physical therapy for help with improvement.  Other treatment option would be a stent which will be deferred to pulmonology at this time; also discussed with ENT per pulm rec to rule out any upper airway disease contributing  - continue scheduled duonebs for now - follow up ENT and pulm evaluations - PT/OT/SLP consulted

## 2020-05-26 NOTE — Assessment & Plan Note (Addendum)
-   continue flomax and oxybutynin

## 2020-05-26 NOTE — Evaluation (Signed)
Clinical/Bedside Swallow Evaluation Patient Details  Name: Adam Conner MRN: [id number] Date of Birth: 05-Apr-1933  Today's Date: 05/26/2020 Time: SLP Start Time (ACUTE ONLY): 1539 SLP Stop Time (ACUTE ONLY): 1555 SLP Time Calculation (min) (ACUTE ONLY): 16 min  Past Medical History:  Past Medical History:  Diagnosis Date  . Bacteremia   . Benign prostatic hyperplasia without lower urinary tract symptoms   . Cognitive communication deficit   . COVID-19   . Dementia (HCC)   . Hypertension   . Metabolic encephalopathy   . Methicillin resistant Staphylococcus aureus infection as the cause of diseases classified elsewhere   . Muscle weakness (generalized)   . Other abnormalities of gait and mobility   . Unsteadiness on feet    Past Surgical History:  Past Surgical History:  Procedure Laterality Date  . PACEMAKER INSERTION     HPI:  Adam Conner is an 87 y.o. male with PMH significant for recent discharge last month after treatment of MRSA bacteremia secondary to OM, DM2, HTN, atrial fibrillation, dementia, Covid 03/2020, admitted for stridor. Chest CT revealed narrowing of the trachea just proximal to the carina and narrowing of the mainstem bronchi consistent with tracheobronchomalacia   Assessment / Plan / Recommendation Clinical Impression  Pt's baseline respiratory pattern on arrival was stable without dyspnea. He exhibited adequate reciprocity of swallow sequential sips water and breathing. No s/s aspiration with liquid, puree or solid texture. He has partially missing dentition diffusely in oral cavity. RN reported he exhibited mild hesitancy swallowing larger pill. Oral-motor assessment unremarkable. Do not suspect he is silently aspirating at this time. Continue regular texture, thin liquids, pills whole in puree if large. No further ST needed. SLP Visit Diagnosis: Dysphagia, unspecified (R13.10)    Aspiration Risk  Mild aspiration risk    Diet Recommendation Regular;Thin  liquid   Liquid Administration via: Cup;Straw Medication Administration: Whole meds with liquid Supervision: Patient able to self feed;Staff to assist with self feeding Compensations: Minimize environmental distractions Postural Changes: Seated upright at 90 degrees    Other  Recommendations Oral Care Recommendations: Oral care BID   Follow up Recommendations None      Frequency and Duration            Prognosis        Swallow Study   General Date of Onset: 05/25/20 HPI: Adam Conner is an 87 y.o. male with PMH significant for recent discharge last month after treatment of MRSA bacteremia secondary to OM, DM2, HTN, atrial fibrillation, dementia, Covid 03/2020, admitted for stridor. Chest CT revealed narrowing of the trachea just proximal to the carina and narrowing of the mainstem bronchi consistent with tracheobronchomalacia Type of Study: Bedside Swallow Evaluation Previous Swallow Assessment:  (none) Diet Prior to this Study: Regular;Thin liquids Temperature Spikes Noted: No Respiratory Status: Nasal cannula History of Recent Intubation: No Behavior/Cognition: Alert;Cooperative;Pleasant mood;Distractible;Requires cueing Oral Cavity Assessment: Dry Oral Care Completed by SLP: No Vision: Functional for self-feeding Self-Feeding Abilities: Needs assist Patient Positioning: Upright in bed Baseline Vocal Quality: Normal Volitional Cough: Cognitively unable to elicit    Oral/Motor/Sensory Function Overall Oral Motor/Sensory Function: Within functional limits   Ice Chips Ice chips: Not tested   Thin Liquid Thin Liquid: Within functional limits Presentation: Straw    Nectar Thick Nectar Thick Liquid: Not tested   Honey Thick Honey Thick Liquid: Not tested   Puree Puree: Within functional limits   Solid     Solid: Within functional limits      Adam Conner,  Breck Coons 05/26/2020,4:23 PM Breck Coons Adam Conner.Ed Nurse, children's [phone] Office  [phone]

## 2020-05-26 NOTE — Assessment & Plan Note (Signed)
-  Previously hospitalized with MRSA bacteremia due to ulcer -Continue vancomycin

## 2020-05-26 NOTE — Assessment & Plan Note (Signed)
-  Replete and recheck as needed 

## 2020-05-26 NOTE — Evaluation (Signed)
Physical Therapy Evaluation Patient Details Name: Adam Conner MRN: [id number] DOB: [birth year] Today's Date: 05/26/2020   History of Present Illness  87 y.o. male presents to [facility] ED [facility] with progressive SOB, stridor, and acute BLE edema. Pt with recent admission for osteomyelitis L foot. Chest x-ray was suggestive of some pulmonary edema and mild bibasilar atelectasis. 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  Clinical Impression  Pt presents to PT with deficits in cognition, gait, balance, functional mobility, strength, power, and endurance. Pt requires significant physical assistance to perform all functional mobility at this time and demonstrates reduced awareness of all deficits. Pt is unable to stand due to LE strength and power deficits. Pt will benefit from continued acute PT POC to improve mobility quality and to reduce falls risk. PT recommends return to SNF at the time of discharge.    Follow Up Recommendations SNF    Equipment Recommendations  Wheelchair (measurements PT);Wheelchair cushion (measurements PT);Hospital bed (mechanical lift if D/C home)    Recommendations for Other Services       Precautions / Restrictions Precautions Precautions: Fall Precaution Comments: monitor SpO2 Restrictions Weight Bearing Restrictions: No      Mobility  Bed Mobility Overal bed mobility: Needs Assistance Bed Mobility: Supine to Sit;Sit to Supine     Supine to sit: Max assist;HOB elevated Sit to supine: Max assist;+2 for physical assistance        Transfers Overall transfer level: Needs assistance Equipment used: 1 person hand held assist;2 person hand held assist Transfers: Sit to/from Stand Sit to Stand: Max assist;+2 physical assistance (unable to  clear buttocks from bed)            Ambulation/Gait                Stairs            Wheelchair Mobility    Modified Rankin (Stroke Patients Only)       Balance Overall balance assessment: Needs assistance Sitting-balance support: No upper extremity supported;Feet supported Sitting balance-Leahy Scale: Fair                                       Pertinent Vitals/Pain Pain Assessment: Faces Faces Pain Scale: Hurts little more Pain Location: arms with transfer attempts Pain Descriptors / Indicators: Grimacing Pain Intervention(s): Monitored during session    Home Living Family/patient expects to be discharged to:: Skilled nursing facility                 Additional Comments: pt admitted from SNF, lives home alone with assistance from PCA or family at baseline. One level house with level entry    Prior Function Level of Independence: Needs assistance   Gait / Transfers Assistance Needed: pt ambulating with cane prior to recent admission in february, pt unable to accurately report progress at SNF 2/2 dementia  ADL's / Homemaking Assistance Needed: assist as needed from PCA and family        Hand Dominance   Dominant Hand: Right    Extremity/Trunk Assessment   Upper Extremity Assessment Upper Extremity Assessment: Generalized weakness    Lower Extremity Assessment Lower Extremity Assessment: Generalized weakness    Cervical / Trunk Assessment Cervical / Trunk  Assessment: Kyphotic  Communication   Communication: No difficulties  Cognition Arousal/Alertness: Awake/alert (maintains eyes closed for much of session) Behavior During Therapy: Flat affect Overall Cognitive Status: History of cognitive impairments - at baseline                                 General Comments: pt is disoriented to place, situation, time. Pt with impaired awareness of deficits and safety. Slowed processing      General Comments  General comments (skin integrity, edema, etc.): pt on 3L [place], VSS sats in mid 90s    Exercises     Assessment/Plan    PT Assessment Patient needs continued PT services  PT Problem List Decreased strength;Decreased activity tolerance;Decreased balance;Decreased mobility;Decreased cognition;Decreased knowledge of use of DME;Decreased safety awareness;Decreased knowledge of precautions;Cardiopulmonary status limiting activity       PT Treatment Interventions DME instruction;Gait training;Functional mobility training;Therapeutic activities;Therapeutic exercise;Balance training;Neuromuscular re-education;Cognitive remediation;Patient/family education;Wheelchair mobility training    PT Goals (Current goals can be found in the Care Plan section)  Acute Rehab PT Goals Patient Stated Goal: to get out of bed PT Goal Formulation: With patient Time For Goal Achievement: 06/09/20 Potential to Achieve Goals: Fair    Frequency Min 2X/week   Barriers to discharge        Co-evaluation               AM-PAC PT "6 Clicks" Mobility  Outcome Measure Help needed turning from your back to your side while in a flat bed without using bedrails?: A Lot Help needed moving from lying on your back to sitting on the side of a flat bed without using bedrails?: A Lot Help needed moving to and from a bed to a chair (including a wheelchair)?: Total Help needed standing up from a chair using your arms (e.g., wheelchair or bedside chair)?: Total Help needed to walk in hospital room?: Total Help needed climbing 3-5 steps with a railing? : Total 6 Click Score: 8    End of Session Equipment Utilized During Treatment: Oxygen Activity Tolerance: Patient limited by fatigue Patient left: in bed;with call bell/phone within reach;with bed alarm set Nurse Communication: Mobility status;Need for lift equipment PT Visit Diagnosis: Other abnormalities of gait and mobility (R26.89);Muscle weakness (generalized)  (M62.81)    Time: 1610-9604 PT Time Calculation (min) (ACUTE ONLY): 24 min   Charges:   PT Evaluation $PT Eval Low Complexity: 1 Low          Arlyss Gandy, PT, DPT Acute Rehabilitation Pager: [phone]   Arlyss Gandy 05/26/2020, 5:44 PM

## 2020-05-26 NOTE — Assessment & Plan Note (Signed)
Continue lisinopril

## 2020-05-26 NOTE — Consult Note (Signed)
Reason for Consult: Stridor Referring Physician: Lewie Chamber MD  Adam Conner is an 87 y.o. male.  HPI: I was asked to come see the patient today because of an intrathoracic tracheobronchial mass and associated difficulties breathing.  Pulmonary was initially consulted for this pulmonary mass and they recommended I see the patient.  On entry into the room, the patient is lying supine and appears comfortable.  There is no audible stridor or any difficulties moving air.  He is able to communicate clearly without shortness of breath and states, " You cannot examine me".  Past Medical History:  Diagnosis Date  . Bacteremia   . Benign prostatic hyperplasia without lower urinary tract symptoms   . Cognitive communication deficit   . COVID-19   . Dementia (HCC)   . Hypertension   . Metabolic encephalopathy   . Methicillin resistant Staphylococcus aureus infection as the cause of diseases classified elsewhere   . Muscle weakness (generalized)   . Other abnormalities of gait and mobility   . Unsteadiness on feet     Past Surgical History:  Procedure Laterality Date  . PACEMAKER INSERTION      No family history on file.  Social History:  reports that he has quit smoking. He has never used smokeless tobacco. He reports that he does not drink alcohol and does not use drugs.  Allergies: No Known Allergies  Medications: I have reviewed the patient's current medications.  CT SOFT TISSUE NECK IMPRESSION: 1. No evidence of mucosal or submucosal mass lesion. No tonsillitis or epiglottitis. No airway compromise seen to explain stridor. 2. Atherosclerotic calcification at both carotid bifurcations, not evaluated in detail using this technique.  CT CHEST IMPRESSION: 1. Bilateral pleural effusions layering dependently. Dependent pulmonary atelectasis. 2. Cardiomegaly. Extensive coronary artery calcification. Pacemaker in place. 3. Narrowing of the trachea just proximal to the carina  and narrowing of the mainstem bronchi consistent with tracheobronchomalacia. 4. Old appearing compression deformities at T1 and T3. 5. Aortic atherosclerosis.  Review of Systems Blood pressure (!) 155/62, pulse (!) 59, temperature 98.6 F (37 C), temperature source Axillary, resp. rate 20, height 5\' 10"  (1.778 m), weight 108.1 kg, SpO2 98 %.   Physical Exam   My exam is limited due to patient's lack of cooperation and unwillingness to allow me to touch him.  From the bedside, it is apparent he is in no acute distress.  He is able to speak in complete sentences and exhibits no audible stridor on expiration or inspiration.  He appears well oxygenated.  FFOL - refused by patient  Assessment/Plan:  Tracheobronchial narrowing  There is nothing to suggest impending airway compromise at the laryngeal level.  Review of the chest and soft tissue neck CT reveals no abnormality in the neck or subglottis.  Abnormalities are seen at the level of the carina and mainstem bronchi only.  At this point, I am not able to evaluate the true vocal cords due to patient's lack of cooperation and willingness to consent to the exam.  With that being said, there is nothing to indicate impending airway compromise.  I would recommend a pulmonary consult for work-up of this apparent pulmonary stenosis on CT scan.  Please reconsult prn.  05/26/2020, 3:20 PM

## 2020-05-26 NOTE — Progress Notes (Signed)
Progress Note    Adam Conner   JYN:[id number]  DOB: 1933/05/26  DOA: 05/25/2020     1  PCP: Patient, No Pcp Per (Inactive)  CC: stridor  Hospital Course: Adam Conner is an 87 year old male with PMH dementia, hypertension, BPH, generalized muscle weakness and deconditioning, PAF (on Xarelto), HTN, HLD, who presents to the hospital after being found to have stridor at his nursing home.  He was recently hospitalized 2/24 - 3/4 for MRSA bacteremia 2/2 diabetic foot ulcer.  He was to complete a 6-week course of vancomycin with completion to be done on 05/31/2020. Of note he has a pacemaker in place and per discharge summary he was to continue with antibiotics rather than removing pacemaker.  He was treated with CPAP, racemic epi, and epinephrine as well as duo nebs on transport to the hospital.  He was able to be transitioned from CPAP to 2 L oxygen.  Due to his dementia he was unable to provide much meaningful information.  He underwent further work-up on admission including CXR and CT neck and chest. Findings revealed bilateral pleural effusions, pulmonary atelectasis, and narrowing of the trachea just proximal to the carina and narrowing of the mainstem bronchi consistent with tracheobronchomalacia. Due to some concern for underlying pneumonia, he was started on vancomycin and cefepime. He was also continued on vancomycin for his ongoing treatment of MRSA bacteremia and prior diabetic foot ulcer.   Interval History:  Seen bedside this morning very deconditioned appearing and lethargic in bed.  At rest no stridor appreciated however even upon having patient sit up and leaning forward, stridor became more apparent.  He takes a few minutes to catch his breath and recover once laying back in bed. Due to CT findings, consults placed to ENT and pulmonology however patient also needs significant physical therapy at this point as well.  ROS: Constitutional: positive for fatigue and malaise,  negative for chills and fevers, Respiratory: positive for cough, dyspnea on exertion and stridor, negative for hemoptysis and pleurisy/chest pain, Cardiovascular: negative for chest pain and Gastrointestinal: negative for abdominal pain  Assessment & Plan: * Stridor -No known prior history of similar.  Given his global deconditioned state, CT is showing tracheobronchomalacia which is not surprising.  Likely poor treatment candidate and will need aggressive physical therapy for help with improvement.  Other treatment option would be a stent which will be deferred to pulmonology at this time; also discussed with ENT per pulm rec to rule out any upper airway disease contributing  - continue scheduled duonebs for now - follow up ENT and pulm evaluations - PT/OT/SLP consulted   Acute respiratory failure with hypoxia (HCC) -Considered multifactorial but largely in part due to underlying pleural effusions, some pulmonary edema, and possible pneumonia -Continue oxygen and wean as able - may have also aspirated given deconditioning and underlying tracheobronchomalacia -Check procalcitonin and trend -Continue vancomycin, cefepime, and add Flagyl.  De-escalate as able -Follow-up pulmonology and ENT consults, appreciate assistance - continue working with IS with patient   Pleural effusion -Bilateral effusions noted on CT -Recent echo on 04/17/2020 - for CHF.  EF 60 to 65%, mild LVH, no noted diastolic dysfunction.  Mild LVH. Mild to moderate aortic valve sclerosis, no AS -DC HCTZ -Start on daily Lasix; repeat CXR to re-eval as needed  MRSA bacteremia -Currently on 6-week course for prior diabetic foot ulcer with MRSA bacteremia.  End date is 05/31/2020 per last discharge summary -Continue vancomycin per pharmacy  Dementia without behavioral disturbance (  HCC) -Continue fall precautions and reorient as needed  Hypokalemia -Replete and recheck as needed  PAF (paroxysmal atrial fibrillation) (HCC) -  continue xarelto  -Patient has pacemaker in place  Diabetic foot ulcer (HCC) -Previously hospitalized with MRSA bacteremia due to ulcer -Continue vancomycin  Alcohol dependence with uncomplicated withdrawal (HCC) No s/s w/d at this time  Presence of cardiac pacemaker -Not recommended for removal per last discharge summary -Currently on vancomycin for prior MRSA bacteremia  Benign prostatic hyperplasia - continue flomax and oxybutynin  Essential hypertension -Continue lisinopril    Old records reviewed in assessment of this patient  Antimicrobials: Vancomycin 04/19/2020 (6 week course planned) >> current Cefepime 05/25/20 >> current  Flagyl 05/26/20 > current   DVT prophylaxis: rivaroxaban (XARELTO) tablet 20 mg Start: 05/25/20 2300 rivaroxaban (XARELTO) tablet 20 mg   Code Status:   Code Status: Full Code Family Communication:   Disposition Plan: Status is: Inpatient  Remains inpatient appropriate because:IV treatments appropriate due to intensity of illness or inability to take PO and Inpatient level of care appropriate due to severity of illness   Dispo: The patient is from: SNF              Anticipated d/c is to: SNF              Patient currently is not medically stable to d/c.   Difficult to place patient No Risk of unplanned readmission score: Unplanned Admission- Pilot do not use: 19.97   Objective: Blood pressure (!) 155/62, pulse (!) 59, temperature 98.6 F (37 C), temperature source Axillary, resp. rate 20, height 5\' 10"  (1.778 m), weight 108.1 kg, SpO2 98 %.  Examination: General appearance: Lethargic but awake and able to answer questions appropriately and follow commands.  Very deconditioned appearing Head: Normocephalic, without obvious abnormality, atraumatic Eyes: EOMI  Neck: no stridor at rest; trachea midline; does have some stridor with exertion in bed Lungs: decreased bibasilar BS which are coarse throughout with scattered wheezing Heart: regular  rate and rhythm and S1, S2 normal Abdomen: normal findings: bowel sounds normal and soft, non-tender Extremities: trace LE edema Skin: mobility and turgor normal Neurologic: some cognitive impairment appreciated but follows commands and moves all 4 extremities   Consultants:   Pulm  ENT  Procedures:     Data Reviewed: I have personally reviewed following labs and imaging studies Results for orders placed or performed during the hospital encounter of 05/25/20 (from the past 24 hour(s))  SARS CORONAVIRUS 2 (TAT 6-24 HRS) Nasopharyngeal Nasopharyngeal Swab     Status: None   Collection Time: 05/25/20  9:00 PM   Specimen: Nasopharyngeal Swab  Result Value Ref Range   SARS Coronavirus 2 NEGATIVE NEGATIVE  Basic metabolic panel     Status: Abnormal   Collection Time: 05/26/20  3:40 AM  Result Value Ref Range   Sodium 139 135 - 145 mmol/L   Potassium 3.5 3.5 - 5.1 mmol/L   Chloride 110 98 - 111 mmol/L   CO2 20 (L) 22 - 32 mmol/L   Glucose, Bld 116 (H) 70 - 99 mg/dL   BUN 16 8 - 23 mg/dL   Creatinine, Ser 1.911.00 0.61 - 1.24 mg/dL   Calcium 8.7 (L) 8.9 - 10.3 mg/dL   GFR, Estimated >47>60 >82>60 mL/min   Anion gap 9 5 - 15  CBC     Status: Abnormal   Collection Time: 05/26/20  3:40 AM  Result Value Ref Range   WBC 13.7 (H) 4.0 - 10.5  K/uL   RBC 3.80 (L) 4.22 - 5.81 MIL/uL   Hemoglobin 11.1 (L) 13.0 - 17.0 g/dL   HCT 36.1 (L) 44.3 - 15.4 %   MCV 92.1 80.0 - 100.0 fL   MCH 29.2 26.0 - 34.0 pg   MCHC 31.7 30.0 - 36.0 g/dL   RDW 00.8 67.6 - 19.5 %   Platelets 207 150 - 400 K/uL   nRBC 0.0 0.0 - 0.2 %  Respiratory (~20 pathogens) panel by PCR     Status: None   Collection Time: 05/26/20  8:54 AM   Specimen: Nasopharyngeal Swab; Respiratory  Result Value Ref Range   Adenovirus NOT DETECTED NOT DETECTED   Coronavirus 229E NOT DETECTED NOT DETECTED   Coronavirus HKU1 NOT DETECTED NOT DETECTED   Coronavirus NL63 NOT DETECTED NOT DETECTED   Coronavirus OC43 NOT DETECTED NOT DETECTED    Metapneumovirus NOT DETECTED NOT DETECTED   Rhinovirus / Enterovirus NOT DETECTED NOT DETECTED   Influenza A NOT DETECTED NOT DETECTED   Influenza B NOT DETECTED NOT DETECTED   Parainfluenza Virus 1 NOT DETECTED NOT DETECTED   Parainfluenza Virus 2 NOT DETECTED NOT DETECTED   Parainfluenza Virus 3 NOT DETECTED NOT DETECTED   Parainfluenza Virus 4 NOT DETECTED NOT DETECTED   Respiratory Syncytial Virus NOT DETECTED NOT DETECTED   Bordetella pertussis NOT DETECTED NOT DETECTED   Bordetella Parapertussis NOT DETECTED NOT DETECTED   Chlamydophila pneumoniae NOT DETECTED NOT DETECTED   Mycoplasma pneumoniae NOT DETECTED NOT DETECTED    Recent Results (from the past 240 hour(s))  Culture, blood (routine x 2)     Status: None (Preliminary result)   Collection Time: 05/25/20 10:50 AM   Specimen: BLOOD RIGHT ARM  Result Value Ref Range Status   Specimen Description BLOOD RIGHT ARM  Final   Special Requests   Final    BOTTLES DRAWN AEROBIC AND ANAEROBIC Blood Culture results may not be optimal due to an inadequate volume of blood received in culture bottles   Culture   Final    NO GROWTH < 24 HOURS Performed at [facility], [address] [place], [place] [address]    Report Status PENDING  Incomplete  Culture, blood (routine x 2)     Status: None (Preliminary result)   Collection Time: 05/25/20 10:56 AM   Specimen: BLOOD RIGHT HAND  Result Value Ref Range Status   Specimen Description BLOOD RIGHT HAND  Final   Special Requests   Final    BOTTLES DRAWN AEROBIC AND ANAEROBIC Blood Culture results may not be optimal due to an inadequate volume of blood received in culture bottles   Culture   Final    NO GROWTH < 24 HOURS Performed at [facility], [address], [place], [place] [address]    Report Status PENDING  Incomplete  SARS CORONAVIRUS 2 (TAT 6-24 HRS) Nasopharyngeal Nasopharyngeal Swab     Status: None   Collection Time: 05/25/20  9:00 PM   Specimen: Nasopharyngeal  Swab  Result Value Ref Range Status   SARS Coronavirus 2 NEGATIVE NEGATIVE Final    Comment: (NOTE) SARS-CoV-2 target nucleic acids are NOT DETECTED.  The SARS-CoV-2 RNA is generally detectable in upper and lower respiratory specimens during the acute phase of infection. Negative results do not preclude SARS-CoV-2 infection, do not rule out co-infections with other pathogens, and should not be used as the sole basis for treatment or other patient management decisions. Negative results must be combined with clinical  observations, patient history, and epidemiological information. The expected result is Negative.  Fact Sheet for Patients: HairSlick.no  Fact Sheet for Healthcare Providers: quierodirigir.com  This test is not yet approved or cleared by the [place] FDA and  has been authorized for detection and/or diagnosis of SARS-CoV-2 by FDA under an Emergency Use Authorization (EUA). This EUA will remain  in effect (meaning this test can be used) for the duration of the COVID-19 declaration under Se ction 564(b)(1) of the Act, 21 U.S.C. section 360bbb-3(b)(1), unless the authorization is terminated or revoked sooner.  Performed at [facility], [address], [place], [place] [address]   Respiratory (~20 pathogens) panel by PCR     Status: None   Collection Time: 05/26/20  8:54 AM   Specimen: Nasopharyngeal Swab; Respiratory  Result Value Ref Range Status   Adenovirus NOT DETECTED NOT DETECTED Final   Coronavirus 229E NOT DETECTED NOT DETECTED Final    Comment: (NOTE) The Coronavirus on the Respiratory Panel, DOES NOT test for the novel  Coronavirus (2019 nCoV)    Coronavirus HKU1 NOT DETECTED NOT DETECTED Final   Coronavirus NL63 NOT DETECTED NOT DETECTED Final   Coronavirus OC43 NOT DETECTED NOT DETECTED Final   Metapneumovirus NOT DETECTED NOT DETECTED Final   Rhinovirus / Enterovirus NOT DETECTED NOT  DETECTED Final   Influenza A NOT DETECTED NOT DETECTED Final   Influenza B NOT DETECTED NOT DETECTED Final   Parainfluenza Virus 1 NOT DETECTED NOT DETECTED Final   Parainfluenza Virus 2 NOT DETECTED NOT DETECTED Final   Parainfluenza Virus 3 NOT DETECTED NOT DETECTED Final   Parainfluenza Virus 4 NOT DETECTED NOT DETECTED Final   Respiratory Syncytial Virus NOT DETECTED NOT DETECTED Final   Bordetella pertussis NOT DETECTED NOT DETECTED Final   Bordetella Parapertussis NOT DETECTED NOT DETECTED Final   Chlamydophila pneumoniae NOT DETECTED NOT DETECTED Final   Mycoplasma pneumoniae NOT DETECTED NOT DETECTED Final    Comment: Performed at [facility], [address] [place], [place] [address]     Radiology Studies: CT Soft Tissue Neck W Contrast  Result Date: 05/25/2020 CLINICAL DATA:  Epiglottitis or tonsillitis. Stridor. Leukocytosis. Fever. EXAM: CT NECK WITH CONTRAST TECHNIQUE: Multidetector CT imaging of the neck was performed using the standard protocol following the bolus administration of intravenous contrast. CONTRAST:  75mL OMNIPAQUE IOHEXOL 300 MG/ML  SOLN COMPARISON:  None. FINDINGS: Pharynx and larynx: No evidence of mucosal or submucosal mass lesion. No airway compromise seen to explain stridor. Tonsils are normal. Epiglottis is normal. Glottic region is normal. Subglottic trachea appears to be patent. See results of chest study. Salivary glands: Parotid and submandibular glands are normal. Thyroid: Normal Lymph nodes: No enlarged or low-density nodes on either side of the neck. Vascular: Atherosclerotic calcification at both carotid bifurcations, not evaluated in detail using this technique. Limited intracranial: Normal Visualized orbits: Normal Mastoids and visualized paranasal sinuses: Clear Skeleton: Chronic degenerative spondylosis. Some chronic loss of height of the T1 and T3 vertebral bodies. Upper chest: See results of chest CT. Other: None IMPRESSION: 1. No evidence of  mucosal or submucosal mass lesion. No tonsillitis or epiglottitis. No airway compromise seen to explain stridor. 2. Atherosclerotic calcification at both carotid bifurcations, not evaluated in detail using this technique. Electronically Signed   By: Paulina Fusi M.D.   On: 05/25/2020 19:15   CT Chest Wo Contrast  Result Date: 05/25/2020 CLINICAL DATA:  Fever.  The facet doses.  Stridor. EXAM: CT CHEST WITHOUT  CONTRAST TECHNIQUE: Multidetector CT imaging of the chest was performed following the standard protocol without IV contrast. COMPARISON:  Neck CT same day FINDINGS: Cardiovascular: Cardiomegaly. Pacemaker in place. Extensive coronary artery calcification. Aortic atherosclerotic calcification. Mediastinum/Nodes: No mediastinal or hilar mass or lymphadenopathy. Lungs/Pleura: Bilateral pleural effusions layering dependently. Dependent pulmonary atelectasis. Non dependent lungs are well aerated without evidence of active process. There is narrowing of the trachea just proximal to the carina and narrowing of the mainstem bronchi consistent with tracheobronchomalacia. Upper Abdomen: Negative Musculoskeletal: Old appearing compression deformities at T1 and T3. No acute thoracic bone finding. IMPRESSION: 1. Bilateral pleural effusions layering dependently. Dependent pulmonary atelectasis. 2. Cardiomegaly. Extensive coronary artery calcification. Pacemaker in place. 3. Narrowing of the trachea just proximal to the carina and narrowing of the mainstem bronchi consistent with tracheobronchomalacia. 4. Old appearing compression deformities at T1 and T3. 5. Aortic atherosclerosis. Aortic Atherosclerosis (ICD10-I70.0). Electronically Signed   By: Paulina Fusi M.D.   On: 05/25/2020 19:21   DG Chest Portable 1 View  Result Date: 05/25/2020 CLINICAL DATA:  Shortness of breath. EXAM: PORTABLE CHEST 1 VIEW COMPARISON:  April 23, 2020. FINDINGS: Stable cardiomegaly. Left-sided pacemaker is unchanged in position. Right-sided  PICC line is unchanged. No pneumothorax is noted. Mild bibasilar subsegmental atelectasis or possibly pulmonary edema is noted. No significant pleural effusion is noted. Bony thorax is unremarkable. IMPRESSION: Mild bibasilar subsegmental atelectasis or possibly pulmonary edema is noted. Electronically Signed   By: Lupita Raider M.D.   On: 05/25/2020 10:35   CT Soft Tissue Neck W Contrast  Final Result    CT Chest Wo Contrast  Final Result    DG Chest Portable 1 View  Final Result      Scheduled Meds: . Chlorhexidine Gluconate Cloth  6 each Topical Daily  . docusate sodium  100 mg Oral BID  . furosemide  40 mg Intravenous Daily  . ipratropium-albuterol  3 mL Nebulization Q6H  . lisinopril  20 mg Oral QHS  . magnesium oxide  400 mg Oral Daily  . mouth rinse  15 mL Mouth Rinse BID  . oxybutynin  5 mg Oral QHS  . rivaroxaban  20 mg Oral QHS  . sodium chloride flush  10-40 mL Intracatheter Q12H  . sodium chloride flush  3 mL Intravenous Q12H  . tamsulosin  0.4 mg Oral QPC breakfast  . thiamine  100 mg Oral q AM   PRN Meds: sodium chloride, acetaminophen **OR** acetaminophen, bisacodyl, polyethylene glycol, sodium chloride flush, sodium chloride flush Continuous Infusions: . sodium chloride    . ceFEPime (MAXIPIME) IV 2 g (05/26/20 1225)  . metronidazole    . vancomycin Stopped (05/26/20 0241)     LOS: 1 day  Time spent: Greater than 50% of the 35 minute visit was spent in counseling/coordination of care for the patient as laid out in the A&P.   Lewie Chamber, MD [facility] 05/26/2020, 2:27 PM

## 2020-05-26 NOTE — Assessment & Plan Note (Addendum)
-  Considered multifactorial but largely in part due to underlying pleural effusions, some pulmonary edema, and possible pneumonia -Continue oxygen and wean as able - may have also aspirated given deconditioning and underlying tracheobronchomalacia -Check procalcitonin and trend -Continue vancomycin, cefepime, and add Flagyl.  De-escalate as able -Follow-up pulmonology and ENT consults, appreciate assistance - continue working with IS with patient

## 2020-05-26 NOTE — Hospital Course (Signed)
Mr. Leaming is an 88 year old male with PMH dementia, hypertension, BPH, generalized muscle weakness and deconditioning, PAF (on Xarelto), HTN, HLD, who presents to the hospital after being found to have stridor at his nursing home.  He was recently hospitalized 2/24 - 3/4 for MRSA bacteremia 2/2 diabetic foot ulcer.  He was to complete a 6-week course of vancomycin with completion to be done on 05/31/2020. Of note he has a pacemaker in place and per discharge summary he was to continue with antibiotics rather than removing pacemaker.  He was treated with CPAP, racemic epi, and epinephrine as well as duo nebs on transport to the hospital.  He was able to be transitioned from CPAP to 2 L oxygen.  Due to his dementia he was unable to provide much meaningful information.  He underwent further work-up on admission including CXR and CT neck and chest. Findings revealed bilateral pleural effusions, pulmonary atelectasis, and narrowing of the trachea just proximal to the carina and narrowing of the mainstem bronchi consistent with tracheobronchomalacia. Due to some concern for underlying pneumonia, he was started on vancomycin and cefepime. He was also continued on vancomycin for his ongoing treatment of MRSA bacteremia and prior diabetic foot ulcer.

## 2020-05-26 NOTE — Plan of Care (Signed)
  Problem: Clinical Measurements: Goal: Respiratory complications will improve Outcome: Progressing   Problem: Nutrition: Goal: Adequate nutrition will be maintained Outcome: Progressing   Problem: Safety: Goal: Ability to remain free from injury will improve Outcome: Progressing   

## 2020-05-26 NOTE — Consult Note (Signed)
NAME:  Adam Conner, MRN:  [id number], DOB:  1933/06/30, LOS: 1 ADMISSION DATE:  05/25/2020, CONSULTATION DATE:  4/5 REFERRING MD:  Girguis TRH, CHIEF COMPLAINT:  Stridor   History of Present Illness:  History is complicated by dementia. Therefore history has been obtained from patient interview, but largely chart review. 87 year old male with PMH as below, which is significant for dementia, HTN, BPU, COVID-19, and atrial fibrillation on xarelto. He recently admitted for sepsis secondary to MRSA bacteremia. HE was discharged on 3/4 to SNF with plans for 6 weeks IV vancomycin, which he was still receiving at the time of admission.   He presented to [facility] ED on 4/4 from [facility] with complaints of hypoxia, respiratory distress, and stridor. Sats were reportedly in the 60s upon EMS arrival. EMS noted wheeze. CPAP was placed and he was treated with Duoneb, IM epinephrine, and racemic epinephrine. He was improved upon arrival to the ED and he was admitted to the hospitalist service. He was felt to be volume overloaded and was treated with diuretics. CT was done to evaluate stridor. Trachea was narrowed just proximal to the carina. PCCM was consulted for further evaluation.   Pertinent  Medical History   has a past medical history of Bacteremia, Benign prostatic hyperplasia without lower urinary tract symptoms, Cognitive communication deficit, COVID-19, Dementia (HCC), Hypertension, Metabolic encephalopathy, Methicillin resistant Staphylococcus aureus infection as the cause of diseases classified elsewhere, Muscle weakness (generalized), Other abnormalities of gait and mobility, and Unsteadiness on feet.   Significant Hospital Events: Including procedures, antibiotic start and stop dates in addition to other pertinent events   . 4/4 admit for stridor/hypoxia  Interim History / Subjective:    Objective   Blood pressure (!) 155/62, pulse (!) 59, temperature 98.6 F (37 C), temperature source  Axillary, resp. rate 20, height 5\' 10"  (1.778 m), weight 108.1 kg, SpO2 98 %.        Intake/Output Summary (Last 24 hours) at 05/26/2020 1409 Last data filed at 05/26/2020 0800 Gross per 24 hour  Intake 519.6 ml  Output --  Net 519.6 ml   Filed Weights   05/25/20 1018  Weight: 108.1 kg    Examination: General: Elderly appearing male in NAD HENT: [place]/AT, PERRL, no JVD Lungs: Diminished in the bases.  No stridor appreciated Cardiovascular: RRR, no MRG Abdomen: Soft, non-tender, normoactive Extremities: No acute deformity. Trace non-pitting edema.  Neuro: Awake, alert, oriented to self only.   Labs/imaging that I have personally reviewed  (right click and "Reselect all SmartList Selections" daily)  Hemoglobin 11.1 CO2 20 WBC 13.7  CT chest 4/5 > Pleural effusions bilaterally. Dependent atelectasis. Tracheal narrowing just proximal to the carina and narrowing of the mainstem bronchi consistent with tracheobronchomalacia.   CT soft tissues neck 4/5 > No evidence of mucosal or submucosal mass lesion. No tonsillitis or epiglottitis. No airway compromise seen to explain stridor.  Resolved Hospital Problem list     Assessment & Plan:   Stridor: narrowing of the airway just proximal to the carina tracking down to both mainstem bronchi. No stridor at the time of my evaluation. Apparently this is only when he gets dyspneic.  - ENT to evaluate. Suspect the location of airway narrowing is too distal for ENT intervention.  - In a debilitated 87 year old gentleman with what seems like advanced dementia, perhaps the most appropriate course of action is supportive care. He does not seem like a great candidate for non-invasive due to AMS.  Bronchoscopy would not be of benefit.   Hypoxia/dyspnea: bilateral effusions and concern for pulmonary edema on imaging.  - Continue diuresing as you are - Effusions are new since February, but if they do not quickly improve with diuresis he will need his  Xarelto held for thoracentesis.  - I&O difficult as the patient is incontinent. Perhaps external catheter or foley would be of value.  - Follow CXR - Reasonable to treat for HCAP, although suspicion is low. Procalcitonin may be helpful.   Other issues per primary - MRSA bacteremia.  - Osteomyelitis - Atrial fibrillation    Best practice (right click and "Reselect all SmartList Selections" daily)  Diet: per primary Pain/Anxiety/Delirium protocol (if indicated): No VAP protocol (if indicated): Not indicated DVT prophylaxis: Systemic AC GI prophylaxis: PPI Glucose control:  SSI No Central venous access:  N/A Arterial line:  N/A Foley:  N/A Mobility:  bed rest  PT consulted: N/A Last date of multidisciplinary goals of care discussion []  Code Status:  full code Disposition: ICU  Labs   CBC: Recent Labs  Lab 05/25/20 1009 05/26/20 0340  WBC 18.6* 13.7*  NEUTROABS 16.3*  --   HGB 11.8* 11.1*  HCT 37.7* 35.0*  MCV 91.7 92.1  PLT 250 207    Basic Metabolic Panel: Recent Labs  Lab 05/25/20 1009 05/26/20 0340  NA 141 139  K 3.0* 3.5  CL 106 110  CO2 20* 20*  GLUCOSE 153* 116*  BUN 14 16  CREATININE 1.01 1.00  CALCIUM 9.1 8.7*   GFR: Estimated Creatinine Clearance: 67.7 mL/min (by C-G formula based on SCr of 1 mg/dL). Recent Labs  Lab 05/25/20 1009 05/25/20 1028 05/25/20 1228 05/26/20 0340  WBC 18.6*  --   --  13.7*  LATICACIDVEN  --  3.6* 3.1*  --     Liver Function Tests: Recent Labs  Lab 05/25/20 1009  AST 25  ALT 17  ALKPHOS 78  BILITOT 1.8*  PROT 6.7  ALBUMIN 3.1*   No results for input(s): LIPASE, AMYLASE in the last 168 hours. No results for input(s): AMMONIA in the last 168 hours.  ABG No results found for: PHART, PCO2ART, PO2ART, HCO3, TCO2, ACIDBASEDEF, O2SAT   Coagulation Profile: No results for input(s): INR, PROTIME in the last 168 hours.  Cardiac Enzymes: No results for input(s): CKTOTAL, CKMB, CKMBINDEX, TROPONINI in the last  168 hours.  HbA1C: Hgb A1c MFr Bld  Date/Time Value Ref Range Status  04/16/2020 09:29 PM 6.6 (H) 4.8 - 5.6 % Final    Comment:    (NOTE) Pre diabetes:          5.7%-6.4%  Diabetes:              >6.4%  Glycemic control for   <7.0% adults with diabetes     CBG: No results for input(s): GLUCAP in the last 168 hours.  Review of Systems:   Patient is encephalopathic and/or intubated. Therefore history has been obtained from chart review.   Past Medical History:  He,  has a past medical history of Bacteremia, Benign prostatic hyperplasia without lower urinary tract symptoms, Cognitive communication deficit, COVID-19, Dementia (HCC), Hypertension, Metabolic encephalopathy, Methicillin resistant Staphylococcus aureus infection as the cause of diseases classified elsewhere, Muscle weakness (generalized), Other abnormalities of gait and mobility, and Unsteadiness on feet.   Surgical History:   Past Surgical History:  Procedure Laterality Date  . PACEMAKER INSERTION       Social History:   reports that he has quit  smoking. He has never used smokeless tobacco. He reports that he does not drink alcohol and does not use drugs.   Family History:  His family history is not on file.   Allergies No Known Allergies   Home Medications  Prior to Admission medications   Medication Sig Start Date End Date Taking? Authorizing Provider  Cholecalciferol (VITAMIN D-3) 25 MCG (1000 UT) CAPS Take 1,000 Units by mouth in the morning.   Yes [provider]  Docusate Sodium 100 MG capsule Take 100 mg by mouth 2 (two) times daily.   Yes [provider]  folic acid (FOLVITE) 1 MG tablet Take 1 mg by mouth in the morning.   Yes [provider]  hydrochlorothiazide (HYDRODIURIL) 12.5 MG tablet Take 12.5 mg by mouth daily. 05/07/20  Yes [provider]  Lactobacillus (PROBIOTIC ACIDOPHILUS) CAPS Take 1 capsule by mouth daily.   Yes [provider]  lisinopril  (ZESTRIL) 20 MG tablet Take 20 mg by mouth at bedtime. 05/20/20  Yes [provider]  magnesium oxide (MAG-OX) 400 MG tablet Take 400 mg by mouth daily.   Yes [provider]  Multiple Vitamins-Minerals (SENIOR VITES PO) Take 1 tablet by mouth daily with breakfast.   Yes [provider]  oxybutynin (DITROPAN-XL) 5 MG 24 hr tablet Take 5 mg by mouth at bedtime. 04/29/20  Yes [provider]  rivaroxaban (XARELTO) 20 MG TABS tablet Take 20 mg by mouth at bedtime.   Yes [provider]  tamsulosin (FLOMAX) 0.4 MG CAPS Take 0.4 mg by mouth daily after breakfast.   Yes [provider]  thiamine 100 MG tablet Take 100 mg by mouth in the morning.   Yes [provider]  vancomycin HCl (VANCOREADY) 1000 MG/200ML SOLN Inject 1,000 mg into the vein 2 (two) times daily. 05/22/20  Yes [provider]     Critical care time: NA     Joneen Roach, AGACNP-BC [facility]  See Amion for personal pager PCCM on call pager [phone] until 7pm. Please call Elink 7p-7a. [phone]  05/26/2020 3:06 PM

## 2020-05-26 NOTE — Assessment & Plan Note (Addendum)
-   continue xarelto  -Patient has pacemaker in place

## 2020-05-26 NOTE — Assessment & Plan Note (Signed)
-  Continue fall precautions and reorient as needed

## 2020-05-26 NOTE — Plan of Care (Signed)
Pt admitted from ED to 5MW-19. Pt is A&Ox1 (baseline dementia) with a calm, pleasant demeanor. When patient exerts himself he develops tachypnea and audible stridor. Currently on 3LNC, So2: 94% w/ normal WOB at rest. Pitting edema noted in upper and lower extremities. Pt noted to be incontinent of bladder, primofit cath applied. Pt not a reliable historian, therefore unable to complete entire admission interview. Pt oriented to unit and provided with education on fall risk prevention strategies and updated on plan of care. Bed alarm is on and bed is in lowest position. Will continue to monitor.   Problem: Education: Goal: Knowledge of General Education information will improve Description: Including pain rating scale, medication(s)/side effects and non-pharmacologic comfort measures Outcome: Progressing   Problem: Health Behavior/Discharge Planning: Goal: Ability to manage health-related needs will improve Outcome: Progressing   Problem: Clinical Measurements: Goal: Ability to maintain clinical measurements within normal limits will improve Outcome: Progressing Goal: Diagnostic test results will improve Outcome: Progressing Goal: Respiratory complications will improve Outcome: Progressing Goal: Cardiovascular complication will be avoided Outcome: Progressing   Problem: Activity: Goal: Risk for activity intolerance will decrease Outcome: Progressing   Problem: Nutrition: Goal: Adequate nutrition will be maintained Outcome: Progressing   Problem: Coping: Goal: Level of anxiety will decrease Outcome: Progressing   Problem: Elimination: Goal: Will not experience complications related to bowel motility Outcome: Progressing   Problem: Safety: Goal: Ability to remain free from injury will improve Outcome: Progressing   Problem: Skin Integrity: Goal: Risk for impaired skin integrity will decrease Outcome: Progressing

## 2020-05-26 NOTE — Assessment & Plan Note (Signed)
-  Not recommended for removal per last discharge summary -Currently on vancomycin for prior MRSA bacteremia

## 2020-05-26 NOTE — Assessment & Plan Note (Signed)
No s/s w/d at this time

## 2020-05-26 NOTE — Assessment & Plan Note (Signed)
-  Currently on 6-week course for prior diabetic foot ulcer with MRSA bacteremia.  End date is 05/31/2020 per last discharge summary -Continue vancomycin per pharmacy

## 2020-05-26 NOTE — Assessment & Plan Note (Addendum)
-  Bilateral effusions noted on CT -Recent echo on 04/17/2020 - for CHF.  EF 60 to 65%, mild LVH, no noted diastolic dysfunction.  Mild LVH. Mild to moderate aortic valve sclerosis, no AS -DC HCTZ -Start on daily Lasix; repeat CXR to re-eval as needed

## 2020-05-27 ENCOUNTER — Inpatient Hospital Stay (HOSPITAL_COMMUNITY): Payer: Medicare PPO

## 2020-05-27 ENCOUNTER — Inpatient Hospital Stay: Payer: Medicare PPO | Admitting: Infectious Disease

## 2020-05-27 DIAGNOSIS — R061 Stridor: Secondary | ICD-10-CM | POA: Diagnosis not present

## 2020-05-27 DIAGNOSIS — J9811 Atelectasis: Secondary | ICD-10-CM | POA: Diagnosis not present

## 2020-05-27 LAB — VANCOMYCIN, TROUGH: Vancomycin Tr: 18 ug/mL (ref 15–20)

## 2020-05-27 LAB — BASIC METABOLIC PANEL
Anion gap: 6 (ref 5–15)
BUN: 20 mg/dL (ref 8–23)
CO2: 24 mmol/L (ref 22–32)
Calcium: 8.7 mg/dL — ABNORMAL LOW (ref 8.9–10.3)
Chloride: 112 mmol/L — ABNORMAL HIGH (ref 98–111)
Creatinine, Ser: 0.95 mg/dL (ref 0.61–1.24)
GFR, Estimated: >60 mL/min (ref >60)
Glucose, Bld: 116 mg/dL — ABNORMAL HIGH (ref 70–99)
Potassium: 3 mmol/L — ABNORMAL LOW (ref 3.5–5.1)
Sodium: 142 mmol/L (ref 135–145)

## 2020-05-27 LAB — CBC WITH DIFFERENTIAL/PLATELET
Abs Immature Granulocytes: 0.08 10*3/uL — ABNORMAL HIGH (ref 0.00–0.07)
Basophils Absolute: 0.1 10*3/uL (ref 0.0–0.1)
Basophils Relative: 1 %
Eosinophils Absolute: 0.7 10*3/uL — ABNORMAL HIGH (ref 0.0–0.5)
Eosinophils Relative: 6 %
HCT: 32.1 % — ABNORMAL LOW (ref 39.0–52.0)
Hemoglobin: 10.1 g/dL — ABNORMAL LOW (ref 13.0–17.0)
Immature Granulocytes: 1 %
Lymphocytes Relative: 6 %
Lymphs Abs: 0.8 10*3/uL (ref 0.7–4.0)
MCH: 28.6 pg (ref 26.0–34.0)
MCHC: 31.5 g/dL (ref 30.0–36.0)
MCV: 90.9 fL (ref 80.0–100.0)
Monocytes Absolute: 1.5 10*3/uL — ABNORMAL HIGH (ref 0.1–1.0)
Monocytes Relative: 12 %
Neutro Abs: 9.2 10*3/uL — ABNORMAL HIGH (ref 1.7–7.7)
Neutrophils Relative %: 74 %
Platelets: 178 10*3/uL (ref 150–400)
RBC: 3.53 MIL/uL — ABNORMAL LOW (ref 4.22–5.81)
RDW: 14.3 % (ref 11.5–15.5)
WBC: 12.4 10*3/uL — ABNORMAL HIGH (ref 4.0–10.5)
nRBC: 0 % (ref 0.0–0.2)

## 2020-05-27 LAB — BLOOD GAS, ARTERIAL
Acid-base deficit: 0.7 mmol/L (ref 0.0–2.0)
Bicarbonate: 22.6 mmol/L (ref 20.0–28.0)
Drawn by: 336832
FIO2: 28
O2 Saturation: 97.9 %
Patient temperature: 37
pCO2 arterial: 32.3 mmHg (ref 32.0–48.0)
pH, Arterial: 7.46 — ABNORMAL HIGH (ref 7.350–7.450)
pO2, Arterial: 99 mmHg (ref 83.0–108.0)

## 2020-05-27 LAB — MAGNESIUM: Magnesium: 1.7 mg/dL (ref 1.7–2.4)

## 2020-05-27 LAB — VITAMIN B12: Vitamin B-12: 1186 pg/mL — ABNORMAL HIGH (ref 180–914)

## 2020-05-27 LAB — TSH: TSH: 0.907 u[IU]/mL (ref 0.350–4.500)

## 2020-05-27 LAB — AMMONIA: Ammonia: 11 umol/L (ref 9–35)

## 2020-05-27 LAB — PROCALCITONIN: Procalcitonin: 0.24 ng/mL

## 2020-05-27 MED ORDER — PANTOPRAZOLE SODIUM 40 MG IV SOLR
40.0000 mg | INTRAVENOUS | Status: DC
  Administered 2020-05-27 – 2020-05-29 (×3): 40 mg via INTRAVENOUS
  Filled 2020-05-27 (×3): qty 40

## 2020-05-27 MED ORDER — FAMOTIDINE 20 MG PO TABS
20.0000 mg | ORAL_TABLET | Freq: Two times a day (BID) | ORAL | Status: DC
  Filled 2020-05-27: qty 1

## 2020-05-27 MED ORDER — POTASSIUM CHLORIDE 10 MEQ/100ML IV SOLN
10.0000 meq | INTRAVENOUS | Status: AC
  Administered 2020-05-27 (×2): 10 meq via INTRAVENOUS
  Filled 2020-05-27 (×2): qty 100

## 2020-05-27 MED ORDER — POTASSIUM CHLORIDE CRYS ER 20 MEQ PO TBCR
40.0000 meq | EXTENDED_RELEASE_TABLET | Freq: Once | ORAL | Status: AC
  Administered 2020-05-27: 40 meq via ORAL
  Filled 2020-05-27: qty 2

## 2020-05-27 MED ORDER — POTASSIUM CHLORIDE CRYS ER 20 MEQ PO TBCR
20.0000 meq | EXTENDED_RELEASE_TABLET | Freq: Every day | ORAL | Status: DC
  Filled 2020-05-27: qty 1

## 2020-05-27 NOTE — Progress Notes (Signed)
Pharmacy Antibiotic Note  Adam Conner is a 87 y.o. male admitted on 05/25/2020 with MRSA bacteremia.  Pharmacy has been consulted for vancomycin dosing. H/O MRSA bacteremia in the setting of PMK. PMK in place - not a candidate for PMK removal per Cardiology.  Vancomycin trough this afternoon is therapeutic at 18  Plan: Continue Vancomycin 1000 mg IV every 12 hours through 05/31/20.  Goal trough 15-20 mcg/mL.  Continue Cefepime 2 g IV every 8 hours Follow up cultures Monitor clinical progress, renal function, Vancomycin levels as indicated    Height: 5\' 10"  (177.8 cm) Weight: 108.1 kg (238 lb 5.1 oz) IBW/kg (Calculated) : 73  Temp (24hrs), Avg:98.2 F (36.8 C), Min:97.8 F (36.6 C), Max:98.5 F (36.9 C)  Recent Labs  Lab 05/25/20 1009 05/25/20 1028 05/25/20 1228 05/25/20 1309 05/26/20 0340 05/27/20 0329 05/27/20 1354  WBC 18.6*  --   --   --  13.7* 12.4*  --   CREATININE 1.01  --   --   --  1.00 0.95  --   LATICACIDVEN  --  3.6* 3.1*  --   --   --   --   VANCOTROUGH  --   --   --   --   --   --  18  VANCORANDOM  --   --   --  17  --   --   --     Estimated Creatinine Clearance: 71.2 mL/min (by C-G formula based on SCr of 0.95 mg/dL).    No Known Allergies  Antimicrobials this admission: 2/25 vancomycin >> (4/10) 4/4 Cefepime >>  4/5 Flagyl >>4/6   Dose adjustments this admission:   Microbiology results: 4/4 Bcx: ng <24h 4/4 RVP: negative 4/4 Cov: negative    Thank you for allowing 6/4 to participate in this patients care. [place], PharmD 05/27/2020 3:12 PM  Please check [email or web address] for unit-specific pharmacy phone numbers.

## 2020-05-27 NOTE — Consult Note (Signed)
WOC Nurse Consult Note: Patient receiving care in [facility] 5M19. Reason for Consult: foot wound; both feet evaluated. Wound type: dried dark spot on tip of left great toe Pressure Injury POA: Yes/No/NA Measurement: na Wound bed: dry, dark spot Drainage (amount, consistency, odor) none Periwound: intact Dressing procedure/placement/frequency: Apply iodine from the swabsticks or swab pads from clean utility to the dark spot on the left great toe.  Allow to air dry.   I have also added an order for bilateral Prevalon heel lift boots, and a standard size bed with air mattress. Patient is very lethargic and does not move self in bed. Thank you for the consult.  Discussed plan of care with the patient and bedside nurse.  WOC nurse will not follow at this time.  Please re-consult the WOC team if needed.  Helmut Muster, RN, MSN, CWOCN, CNS-BC, pager [phone]

## 2020-05-27 NOTE — Progress Notes (Addendum)
PROGRESS NOTE    Adam Conner  YIR:[id number] DOB: 07/18/1933 DOA: 05/25/2020 PCP: Patient, No Pcp Per (Inactive)   Chief Complaint  Patient presents with  . Respiratory Distress  Brief Narrative:  87 year old male with dementia, hypertension, BPH, generalized muscle weakness/deconditioning, PAF on Xarelto, hypertension, hyperlipidemia, morbid obesity, brought to the ED from cannula placed on 4/4 for respiratory distress.  Recently admitted 2/24-3/4 MRSA bacteremia and was seen by ID, EP, he is a poor surgical candidate and EP did not recommend removal of pacemaker and recommended oral suppressive therapy, ID advised 6 weeks of vancomycin followed by oral suppressive therapy with doxycycline lifelong.  He was discharged to [facility] a skilled nursing facility. In the ED patient was febrile 101.3, with leukocytosis, lactic acidosis chest x-ray with mild bibasilar subsegmental atelectasis or possible pulmonary edema, started on broad-spectrum antibiotics vancomycin cefepime Flagyl due to concern for pneumonia. Given stridor distress patient was consulted by ENT and pulmonary CCM team-?  Steroid likely from tracheobronchial narrowing.   Subjective: Seen and examined this morning.  He was slightly drowsy but able to wake up to arouse name and that he is in the hospital unable to tell me his date of birth which hospital he is in.  As I walked in he was not in distress but breathing comfortably as patient is more alert awake and mobilizing able to hear audible wheezing/stridor. Patient tolerating nasal cannula blood pressure in 140s to 160s,afebrile overnight potassium low 3.0 this morning, procalcitonin and WBC count downtrending  Assessment & Plan:  Stridor/acute respiratory distress-acute respiratory failure with hypoxia /Tracheobronchomalacia:CT chest and soft tissue neck no abnormality in the neck or subglottis, ENT failed no impending airway compromise,ENT was not able to evaluate the true  vocal cords due to patient's lack of cooperation and unwillingness to consent.Was seen by pulmonary-felt that it is related to tracheobronchomalacia upper airway which could be function of aging and dementia related to loss of muscle tone and likely acute on chronic diastolic dysfunction as evidenced by pleural effusion advised steroids for 7 to 12 days twice daily diuresis.  MRSA bacteremia on last admission 2/24-3/4 in the setting of PM/PM in place-not a candidate for PMK removal as per cardiology:Patient has been on vancomycin pharmacy dosing and ID following.Follow-up blood cultures.  Hypokalemia replacement ordered.  Anemia likely from chronic disease.  No signs of acute bleeding.  Monitor H&H. Recent Labs  Lab 05/25/20 1009 05/26/20 0340 05/27/20 0329  HGB 11.8* 11.1* 10.1*  HCT 37.7* 35.0* 32.1*   Pleural effusion bilateral, recent echo 2/24 EF 60 to 65%, off HCTZ, started on daily diuresis, follow-up chest x-ray to eval.  Monitor intake output and daily weight Filed Weights   05/25/20 1018  Weight: 108.1 kg  Net IO Since Admission: 2,222.99 mL [05/27/20 1151]  Dementia without behavioral disturbance:alert awake oriented to self.  Continue fall precaution supportive care delirium precaution less alert awake.  Consult speech for swallow evaluation.  Essential hypertension: BP is well controlled.  Continue lisinopril  PAF on Eliquis.  PACEMKAER in place  BPH on Flomax oxybutynin Haldol p.o. once cleared by speech  History of alcohol dependence no signs symptoms symptoms of withdrawal.  He has been hospitalized recently and has been living in a skilled nursing facility since then.  Morbid obesity BMI 35:Will benefit with weight loss.  Goals of care:patient is elderly debilitated multiple comorbidities remains full code.  Overall prognosis remains guarded frequent hospitalization.  We will consult palliative care. Spoke W/ POA Daughter.  If his heart stops-If he is having trouble  breathing wants resuscitation but does not want to be sustained on the machine.  Daughter has agreed for palliative care evaluation Addendum Patient seen again daughter at the bedside around 5 pm today. He has been more lethargic today compared to yesterday per staffs. He easily sleeps but wakes up and interacts well.- oriented to self, appears more or less same like this am.Denies any complaints.  No obesity stridor breathing complete anterior nasal cannula he was able to move his all extremities.  Suspect delirium in the setting of dementia with lethargy.Daughter reports patient does not sleep well during night and hsi sleep cycle is likley altered here,ABG was done no CO2 retention.  Check TSH B12 and ammonia level, chest x-ray, UA. Monitor closely and continue supportive measures fall precaution. Again prognosis remains to be seen,discussed extensively the daughter at the bedside.  Diet Order            Diet heart healthy/carb modified Room service appropriate? Yes; Fluid consistency: Thin  Diet effective now                 DVT prophylaxis: rivaroxaban (XARELTO) tablet 20 mg Start: 05/25/20 2300 Code Status:   Code Status: Full Code  Family Communication: plan of care discussed with patient at bedside. Updated daughter POA.  Status is: Inpatient Remains inpatient appropriate because:Ongoing diagnostic testing needed not appropriate for outpatient work up, IV treatments appropriate due to intensity of illness or inability to take PO and Inpatient level of care appropriate due to severity of illness  Dispo: The patient is from: SNF              Anticipated d/c is to: SNF              Patient currently is not medically stable to d/c.   Difficult to place patient No  Unresulted Labs (From admission, onward)          Start     Ordered   05/27/20 1300  Vancomycin, trough  Once-Timed,   TIMED       Comments: Draw prior to dose being hung. If dose hung, do not draw, call pharmacy to  retime.   Question:  Specimen collection method  Answer:  IV Team=IV Team collect  "And" Linked Group Details   05/27/20 1021   05/27/20 0500  Procalcitonin  Daily,   R     Question:  Specimen collection method  Answer:  Unit=Unit collect   05/26/20 1425   05/27/20 0500  Basic metabolic panel  Daily,   R     Question:  Specimen collection method  Answer:  Unit=Unit collect   05/26/20 1436   05/27/20 0500  CBC with Differential/Platelet  Daily,   R     Question:  Specimen collection method  Answer:  Unit=Unit collect   05/26/20 1436   05/27/20 0500  Magnesium  Daily,   R     Question:  Specimen collection method  Answer:  Unit=Unit collect   05/26/20 1436        Medications reviewed:  Scheduled Meds: . Chlorhexidine Gluconate Cloth  6 each Topical Daily  . furosemide  40 mg Intravenous Daily  . ipratropium-albuterol  3 mL Nebulization Q6H  . lisinopril  20 mg Oral QHS  . magnesium oxide  400 mg Oral Daily  . mouth rinse  15 mL Mouth Rinse BID  . oxybutynin  5 mg Oral QHS  . pantoprazole (PROTONIX)  IV  40 mg Intravenous Q24H  . rivaroxaban  20 mg Oral QHS  . sodium chloride flush  10-40 mL Intracatheter Q12H  . sodium chloride flush  3 mL Intravenous Q12H  . tamsulosin  0.4 mg Oral QPC breakfast  . thiamine  100 mg Oral q AM   Continuous Infusions: . sodium chloride    . ceFEPime (MAXIPIME) IV 2 g (05/27/20 0358)  . potassium chloride 10 mEq (05/27/20 1123)  . vancomycin Stopped (05/27/20 0226)    Consultants:see note  Procedures:see note  Antimicrobials: Anti-infectives (From admission, onward)   Start     Dose/Rate Route Frequency Ordered Stop   05/26/20 1500  metroNIDAZOLE (FLAGYL) IVPB 500 mg  Status:  Discontinued        500 mg 100 mL/hr over 60 Minutes Intravenous Every 8 hours 05/26/20 1413 05/27/20 0842   05/25/20 1930  ceFEPIme (MAXIPIME) 2 g in sodium chloride 0.9 % 100 mL IVPB        2 g 200 mL/hr over 30 Minutes Intravenous Every 8 hours 05/25/20 1310      05/25/20 1430  vancomycin (VANCOREADY) IVPB 1000 mg/200 mL        1,000 mg 200 mL/hr over 60 Minutes Intravenous Every 12 hours 05/25/20 1309     05/25/20 1115  ceFEPIme (MAXIPIME) 2 g in sodium chloride 0.9 % 100 mL IVPB        2 g 200 mL/hr over 30 Minutes Intravenous  Once 05/25/20 1106 05/25/20 1158     Culture/Microbiology    Component Value Date/Time   SDES BLOOD RIGHT HAND 05/25/2020 1056   SPECREQUEST  05/25/2020 1056    BOTTLES DRAWN AEROBIC AND ANAEROBIC Blood Culture results may not be optimal due to an inadequate volume of blood received in culture bottles   CULT  05/25/2020 1056    NO GROWTH < 24 HOURS Performed at [facility], [address] [place], [place] [address]    REPTSTATUS PENDING 05/25/2020 1056    Other culture-see note  Objective: Vitals: Today's Vitals   05/27/20 0129 05/27/20 0452 05/27/20 0851 05/27/20 0933  BP:  (!) 152/65  (!) 168/72  Pulse:  66  60  Resp:  20  20  Temp:  98.5 F (36.9 C)  98.2 F (36.8 C)  TempSrc:  Oral    SpO2: 96% 97% 96% 95%  Weight:      Height:      PainSc:        Intake/Output Summary (Last 24 hours) at 05/27/2020 1143 Last data filed at 05/27/2020 0929 Gross per 24 hour  Intake 1103.39 ml  Output 500 ml  Net 603.39 ml   Filed Weights   05/25/20 1018  Weight: 108.1 kg   Weight change:   Intake/Output from previous day: 04/05 0701 - 04/06 0700 In: 903.4 [P.O.:600; IV Piggyback:303.4] Out: 500 [Urine:500] Intake/Output this shift: Total I/O In: 320 [P.O.:120; IV Piggyback:200] Out: -  Filed Weights   05/25/20 1018  Weight: 108.1 kg    Examination: General exam: Alert awake oriented x1 but easily going back to sleep but able to wake up and interact.   HEENT:Oral mucosa moist, Ear/Nose WNL grossly,dentition normal. Respiratory system: bilaterally air entry present, audible wheezing/stridor moderate activity ,no use of accessory muscle, non tender. Cardiovascular system: S1 & S2 +, regular,  No JVD. Gastrointestinal system: Abdomen soft, NT,ND, BS+. Nervous System:Alert, awake, moving extremities and grossly nonfocal Extremities: No edema, distal peripheral pulses palpable.  Skin: No rashes,no  icterus. MSK: Normal muscle bulk,tone, power  Data Reviewed: I have personally reviewed following labs and imaging studies CBC: Recent Labs  Lab 05/25/20 1009 05/26/20 0340 05/27/20 0329  WBC 18.6* 13.7* 12.4*  NEUTROABS 16.3*  --  9.2*  HGB 11.8* 11.1* 10.1*  HCT 37.7* 35.0* 32.1*  MCV 91.7 92.1 90.9  PLT 250 207 178   Basic Metabolic Panel: Recent Labs  Lab 05/25/20 1009 05/26/20 0340 05/27/20 0329  NA 141 139 142  K 3.0* 3.5 3.0*  CL 106 110 112*  CO2 20* 20* 24  GLUCOSE 153* 116* 116*  BUN 14 16 20   CREATININE 1.01 1.00 0.95  CALCIUM 9.1 8.7* 8.7*  MG  --   --  1.7   GFR: Estimated Creatinine Clearance: 71.2 mL/min (by C-G formula based on SCr of 0.95 mg/dL). Liver Function Tests: Recent Labs  Lab 05/25/20 1009  AST 25  ALT 17  ALKPHOS 78  BILITOT 1.8*  PROT 6.7  ALBUMIN 3.1*   No results for input(s): LIPASE, AMYLASE in the last 168 hours. No results for input(s): AMMONIA in the last 168 hours. Coagulation Profile: No results for input(s): INR, PROTIME in the last 168 hours. Cardiac Enzymes: No results for input(s): CKTOTAL, CKMB, CKMBINDEX, TROPONINI in the last 168 hours. BNP (last 3 results) No results for input(s): PROBNP in the last 8760 hours. HbA1C: No results for input(s): HGBA1C in the last 72 hours. CBG: No results for input(s): GLUCAP in the last 168 hours. Lipid Profile: No results for input(s): CHOL, HDL, LDLCALC, TRIG, CHOLHDL, LDLDIRECT in the last 72 hours. Thyroid Function Tests: No results for input(s): TSH, T4TOTAL, FREET4, T3FREE, THYROIDAB in the last 72 hours. Anemia Panel: No results for input(s): VITAMINB12, FOLATE, FERRITIN, TIBC, IRON, RETICCTPCT in the last 72 hours. Sepsis Labs: Recent Labs  Lab 05/25/20 1028  05/25/20 1228 05/26/20 1750 05/27/20 0329  PROCALCITON  --   --  0.30 0.24  LATICACIDVEN 3.6* 3.1*  --   --     Recent Results (from the past 240 hour(s))  Culture, blood (routine x 2)     Status: None (Preliminary result)   Collection Time: 05/25/20 10:50 AM   Specimen: BLOOD RIGHT ARM  Result Value Ref Range Status   Specimen Description BLOOD RIGHT ARM  Final   Special Requests   Final    BOTTLES DRAWN AEROBIC AND ANAEROBIC Blood Culture results may not be optimal due to an inadequate volume of blood received in culture bottles   Culture   Final    NO GROWTH < 24 HOURS Performed at [facility], [address] [place], [place]    Report Status PENDING  Incomplete  Culture, blood (routine x 2)     Status: None (Preliminary result)   Collection Time: 05/25/20 10:56 AM   Specimen: BLOOD RIGHT HAND  Result Value Ref Range Status   Specimen Description BLOOD RIGHT HAND  Final   Special Requests   Final    BOTTLES DRAWN AEROBIC AND ANAEROBIC Blood Culture results may not be optimal due to an inadequate volume of blood received in culture bottles   Culture   Final    NO GROWTH < 24 HOURS Performed at [facility], [address] [place], [place]    Report Status PENDING  Incomplete  SARS CORONAVIRUS 2 (TAT 6-24 HRS) Nasopharyngeal Nasopharyngeal Swab     Status: None   Collection Time: 05/25/20  9:00 PM   Specimen: Nasopharyngeal  Swab  Result Value Ref Range Status   SARS Coronavirus 2 NEGATIVE NEGATIVE Final    Comment: (NOTE) SARS-CoV-2 target nucleic acids are NOT DETECTED.  The SARS-CoV-2 RNA is generally detectable in upper and lower respiratory specimens during the acute phase of infection. Negative results do not preclude SARS-CoV-2 infection, do not rule out co-infections with other pathogens, and should not be used as the sole basis for treatment or other patient management decisions. Negative results must be combined with clinical  observations, patient history, and epidemiological information. The expected result is Negative.  Fact Sheet for Patients: HairSlick.no  Fact Sheet for Healthcare Providers: quierodirigir.com  This test is not yet approved or cleared by the [place] FDA and  has been authorized for detection and/or diagnosis of SARS-CoV-2 by FDA under an Emergency Use Authorization (EUA). This EUA will remain  in effect (meaning this test can be used) for the duration of the COVID-19 declaration under Se ction 564(b)(1) of the Act, 21 U.S.C. section 360bbb-3(b)(1), unless the authorization is terminated or revoked sooner.  Performed at [facility], [address] [place], [place] [address]   Respiratory (~20 pathogens) panel by PCR     Status: None   Collection Time: 05/26/20  8:54 AM   Specimen: Nasopharyngeal Swab; Respiratory  Result Value Ref Range Status   Adenovirus NOT DETECTED NOT DETECTED Final   Coronavirus 229E NOT DETECTED NOT DETECTED Final    Comment: (NOTE) The Coronavirus on the Respiratory Panel, DOES NOT test for the novel  Coronavirus (2019 nCoV)    Coronavirus HKU1 NOT DETECTED NOT DETECTED Final   Coronavirus NL63 NOT DETECTED NOT DETECTED Final   Coronavirus OC43 NOT DETECTED NOT DETECTED Final   Metapneumovirus NOT DETECTED NOT DETECTED Final   Rhinovirus / Enterovirus NOT DETECTED NOT DETECTED Final   Influenza A NOT DETECTED NOT DETECTED Final   Influenza B NOT DETECTED NOT DETECTED Final   Parainfluenza Virus 1 NOT DETECTED NOT DETECTED Final   Parainfluenza Virus 2 NOT DETECTED NOT DETECTED Final   Parainfluenza Virus 3 NOT DETECTED NOT DETECTED Final   Parainfluenza Virus 4 NOT DETECTED NOT DETECTED Final   Respiratory Syncytial Virus NOT DETECTED NOT DETECTED Final   Bordetella pertussis NOT DETECTED NOT DETECTED Final   Bordetella Parapertussis NOT DETECTED NOT DETECTED Final   Chlamydophila  pneumoniae NOT DETECTED NOT DETECTED Final   Mycoplasma pneumoniae NOT DETECTED NOT DETECTED Final    Comment: Performed at [facility], [address] [place], [place] [address]     Radiology Studies: CT Soft Tissue Neck W Contrast  Result Date: 05/25/2020 CLINICAL DATA:  Epiglottitis or tonsillitis. Stridor. Leukocytosis. Fever. EXAM: CT NECK WITH CONTRAST TECHNIQUE: Multidetector CT imaging of the neck was performed using the standard protocol following the bolus administration of intravenous contrast. CONTRAST:  75mL OMNIPAQUE IOHEXOL 300 MG/ML  SOLN COMPARISON:  None. FINDINGS: Pharynx and larynx: No evidence of mucosal or submucosal mass lesion. No airway compromise seen to explain stridor. Tonsils are normal. Epiglottis is normal. Glottic region is normal. Subglottic trachea appears to be patent. See results of chest study. Salivary glands: Parotid and submandibular glands are normal. Thyroid: Normal Lymph nodes: No enlarged or low-density nodes on either side of the neck. Vascular: Atherosclerotic calcification at both carotid bifurcations, not evaluated in detail using this technique. Limited intracranial: Normal Visualized orbits: Normal Mastoids and visualized paranasal sinuses: Clear Skeleton: Chronic degenerative spondylosis. Some chronic loss of height of the T1 and T3  vertebral bodies. Upper chest: See results of chest CT. Other: None IMPRESSION: 1. No evidence of mucosal or submucosal mass lesion. No tonsillitis or epiglottitis. No airway compromise seen to explain stridor. 2. Atherosclerotic calcification at both carotid bifurcations, not evaluated in detail using this technique. Electronically Signed   By: Paulina FusiMark  Shogry M.D.   On: 05/25/2020 19:15   CT Chest Wo Contrast  Result Date: 05/25/2020 CLINICAL DATA:  Fever.  The facet doses.  Stridor. EXAM: CT CHEST WITHOUT CONTRAST TECHNIQUE: Multidetector CT imaging of the chest was performed following the standard protocol without IV  contrast. COMPARISON:  Neck CT same day FINDINGS: Cardiovascular: Cardiomegaly. Pacemaker in place. Extensive coronary artery calcification. Aortic atherosclerotic calcification. Mediastinum/Nodes: No mediastinal or hilar mass or lymphadenopathy. Lungs/Pleura: Bilateral pleural effusions layering dependently. Dependent pulmonary atelectasis. Non dependent lungs are well aerated without evidence of active process. There is narrowing of the trachea just proximal to the carina and narrowing of the mainstem bronchi consistent with tracheobronchomalacia. Upper Abdomen: Negative Musculoskeletal: Old appearing compression deformities at T1 and T3. No acute thoracic bone finding. IMPRESSION: 1. Bilateral pleural effusions layering dependently. Dependent pulmonary atelectasis. 2. Cardiomegaly. Extensive coronary artery calcification. Pacemaker in place. 3. Narrowing of the trachea just proximal to the carina and narrowing of the mainstem bronchi consistent with tracheobronchomalacia. 4. Old appearing compression deformities at T1 and T3. 5. Aortic atherosclerosis. Aortic Atherosclerosis (ICD10-I70.0). Electronically Signed   By: Paulina FusiMark  Shogry M.D.   On: 05/25/2020 19:21     LOS: 2 days   Lanae Boastamesh Gizzelle [place], MD [facility]  05/27/2020, 11:43 AM

## 2020-05-27 NOTE — Evaluation (Signed)
Occupational Therapy Evaluation Patient Details Name: Adam Conner MRN: [id number] DOB: [birth year] Today's Date: 05/27/2020    History of Present Illness 87 y.o. male presents to [facility] ED from [facility] with progressive SOB, stridor, and acute BLE edema. Pt with recent admission for osteomyelitis L foot. Chest x-ray was suggestive of some pulmonary edema and mild bibasilar atelectasis. 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   Clinical Impression   Pt admitted with above. He demonstrates the below listed deficits and will benefit from continued OT to maximize safety and independence with BADLs.  Pt presents to OT with lethargy, generalized weakness, decreased activity tolerance, impaired balance, impaired cognition.  He currently requires max - total A for ADLs.  He has been at [facility] for rehab since last admission in 03/2020.  Prior to that admission, he was ambulating with a cane, and was able to assist with some of his ADLs.  Recommend return to SNF for continued rehab.       Follow Up Recommendations  SNF    Equipment Recommendations  None recommended by OT    Recommendations for Other Services       Precautions / Restrictions Precautions Precautions: Fall Precaution Comments: monitor SpO2      Mobility Bed Mobility Overal bed mobility: Needs Assistance Bed Mobility: Rolling Rolling: Max assist              Transfers                 General transfer comment: unable to safely attempt due to lethargy    Balance       Sitting balance - Comments: unable to attempt due to lethargy                                   ADL either performed or assessed with clinical judgement   ADL Overall ADL's : Needs assistance/impaired Eating/Feeding:  Total assistance;Bed level   Grooming: Wash/dry face;Minimal assistance;Bed level   Upper Body Bathing: Maximal assistance;Bed level   Lower Body Bathing: Total assistance;Bed level   Upper Body Dressing : Total assistance;Bed level   Lower Body Dressing: Total assistance;Bed level   Toilet Transfer: Total assistance Toilet Transfer Details (indicate cue type and reason): unable to attempt Toileting- Clothing Manipulation and Hygiene: Total assistance;Bed level       Functional mobility during ADLs: Maximal assistance General ADL Comments: Pt limited by lethargy this date     Vision   Additional Comments: pt unable to follow commands accurately for formal visual assessment     Perception     Praxis      Pertinent Vitals/Pain Pain Assessment: Faces Faces Pain Scale: Hurts little more Pain Location: generalized Pain Descriptors / Indicators: Grimacing;Moaning Pain Intervention(s): Monitored during session     Hand Dominance Right   Extremity/Trunk Assessment Upper Extremity Assessment Upper Extremity Assessment: Generalized weakness   Lower Extremity Assessment Lower Extremity Assessment: Generalized weakness   Cervical / Trunk Assessment Cervical / Trunk Assessment: Kyphotic   Communication Communication Communication: No difficulties   Cognition Arousal/Alertness: Lethargic Behavior During Therapy: Flat affect Overall Cognitive Status: No family/caregiver present to determine baseline cognitive functioning  General Comments: Pt is oriented to person only. When asked his DOB he states "[birth year]".  He will follow one step commands ~20% of the time with prompting.  He was very lethargic   General Comments  Pt on 3L supplemental 02 with VSS.  RN present during eval.  Pt very lethargic    Exercises     Shoulder Instructions      Home Living Family/patient expects to be discharged to:: Skilled nursing facility                                  Additional Comments: pt admitted from SNF, lives home alone with assistance from PCA or family at baseline. One level house with level entry      Prior Functioning/Environment Level of Independence: Needs assistance  Gait / Transfers Assistance Needed: Prior to admission in Feb, pt was ambulating with a cane, however, unsure of his status since last admisssion and recent SNF stay ADL's / Homemaking Assistance Needed: Pt required some assist prior to admission in Feb, but unsure of recent status while at SNF            OT Problem List: Decreased strength;Decreased activity tolerance;Impaired balance (sitting and/or standing);Decreased cognition;Decreased safety awareness;Decreased knowledge of use of DME or AE;Cardiopulmonary status limiting activity;Impaired UE functional use      OT Treatment/Interventions: Self-care/ADL training;Therapeutic exercise;Energy conservation;DME and/or AE instruction;Therapeutic activities;Cognitive remediation/compensation;Patient/family education;Balance training    OT Goals(Current goals can be found in the care plan section) Acute Rehab OT Goals Patient Stated Goal: pt didn't state OT Goal Formulation: Patient unable to participate in goal setting Time For Goal Achievement: 06/10/20 Potential to Achieve Goals: Good ADL Goals Pt Will Perform Eating: with min assist;sitting Pt Will Perform Grooming: with min assist;sitting Pt Will Perform Upper Body Bathing: with mod assist;sitting Additional ADL Goal #1: Pt will maintain EOB sitting x 10 mins with mod A in prep for ADLs  OT Frequency: Min 2X/week   Barriers to D/C: Decreased caregiver support  family unable to provide current level of care       Co-evaluation              AM-PAC OT "6 Clicks" Daily Activity     Outcome Measure Help from another person eating meals?: Total Help from another person taking care of personal grooming?: A Lot Help  from another person toileting, which includes using toliet, bedpan, or urinal?: Total Help from another person bathing (including washing, rinsing, drying)?: Total Help from another person to put on and taking off regular upper body clothing?: Total Help from another person to put on and taking off regular lower body clothing?: Total 6 Click Score: 7   End of Session Equipment Utilized During Treatment: Oxygen Nurse Communication: Mobility status  Activity Tolerance: Patient limited by lethargy Patient left: in bed;with call bell/phone within reach;with nursing/sitter in room  OT Visit Diagnosis: Cognitive communication deficit (R41.841);Unsteadiness on feet (R26.81)                Time: 4627-0350 OT Time Calculation (min): 39 min Charges:  OT General Charges $OT Visit: 1 Visit OT Evaluation $OT Eval Moderate Complexity: 1 Mod OT Treatments $Therapeutic Activity: 23-37 mins  Eber Jones., OTR/L Acute Rehabilitation Services Pager [phone] Office [phone]   Jeani Hawking M 05/27/2020, 4:03 PM

## 2020-05-27 NOTE — Consult Note (Signed)
[facility]                                                                                        Patient Identification: Patient Name: Adam Conner MRN: [id number] Admit Date: 05/25/2020 10:00 AM Today's Date: 05/27/2020 Reason for consult: Follow-up MRSA bacteremia Requesting provider: Lanae Boastamesh KC  Principal Problem:   Stridor Active Problems:   Essential hypertension   Benign prostatic hyperplasia   Dementia without behavioral disturbance (HCC)   Presence of cardiac pacemaker   MRSA bacteremia   Alcohol dependence with uncomplicated withdrawal (HCC)   Diabetic foot ulcer (HCC)   PAF (paroxysmal atrial fibrillation) (HCC)   Acute respiratory failure with hypoxia (HCC)   Pleural effusion   Hypokalemia   Tracheal stenosis   Antibiotics: cefepime 4/4-c                    Vancomycin 4/4-c                    Metronidazole 4/5-c  Lines/Tubes: PICC rt arm   Assessment H/O MRSA bacteremia in the setting of PMK  PMK in place - not a candidate for PMK removal per Cardiology  Volume Overload/Bilateral Pleural Effusion - on diuretics Dementia    Recommendations  Continue Vancomycin, pharmacy to dose Follow up blood cultures Respiratory distress is likely related to volume overload than PNA. IV diurectics per primary Will DC metronidazole and plan to DC cefepime if blood cx 4/4 are negative for any growth  Monitor CBC, BMP and Vancomycin trough   Rest of the management as per the primary team. Please call with questions or concerns.  Thank you for the consult __________________________________________________________________________________________________________ HPI and Hospital Course: 87 year old male with past medical history as below who was [facility] to the ED [facility] for respiratory distress.  History cannot be obtained from the patient due to dementia  and hence, history is obtained from chart review.  Reportedly he had acute onset of shortness of breath and acute onset lower extremity edema and cyanosis with sats in the 60s?  Per EMS.  He was given DuoNeb, epinephrine IM and racemic epinephrine.  Oxygen saturation was improved and was on CPAP upon arrival to the ED  At ED, he was febrile with T-max 101.3, leukocytosis with WBC 13.7.  Labs was remarkable for BNP 361.2, lactic acid 3.6.  Chest x-ray with mild bibasilar subsegmental atelectasis or possible pulmonary edema.  Patient was started on broad-spectrum antibiotics vancomycin cefepime and Flagyl for concerns of pneumonia.  Seen by ENT and CCM for ? stridor which was thought to be related to tracheobronchomalacia.  Of note, patient was admitted in the hospital 2/24-3/4 for MRSA bacteremia and was seen by ID and EP.  Given his dementia and patient being a poor surgical candidate, EP did not recommend removal of pacemaker and recommended p.o. suppressive therapy.  ID recommended 6 weeks of vancomycin followed by p.o. suppressive therapy with doxycycline lifelong.   ROS: unobtainable given patient's condition   Past Medical History:  Diagnosis Date  . Bacteremia   .  Benign prostatic hyperplasia without lower urinary tract symptoms   . Cognitive communication deficit   . COVID-19   . Dementia (HCC)   . Hypertension   . Metabolic encephalopathy   . Methicillin resistant Staphylococcus aureus infection as the cause of diseases classified elsewhere   . Muscle weakness (generalized)   . Other abnormalities of gait and mobility   . Unsteadiness on feet    Past Surgical History:  Procedure Laterality Date  . PACEMAKER INSERTION       Scheduled Meds: . Chlorhexidine Gluconate Cloth  6 each Topical Daily  . docusate sodium  100 mg Oral BID  . famotidine  20 mg Oral BID  . furosemide  40 mg Intravenous Daily  . ipratropium-albuterol  3 mL Nebulization Q6H  . lisinopril  20 mg Oral QHS   . magnesium oxide  400 mg Oral Daily  . mouth rinse  15 mL Mouth Rinse BID  . oxybutynin  5 mg Oral QHS  . potassium chloride  20 mEq Oral Daily  . potassium chloride  40 mEq Oral Once  . rivaroxaban  20 mg Oral QHS  . sodium chloride flush  10-40 mL Intracatheter Q12H  . sodium chloride flush  3 mL Intravenous Q12H  . tamsulosin  0.4 mg Oral QPC breakfast  . thiamine  100 mg Oral q AM   Continuous Infusions: . sodium chloride    . ceFEPime (MAXIPIME) IV 2 g (05/27/20 0358)  . metronidazole 500 mg (05/27/20 0004)  . vancomycin 1,000 mg (05/27/20 0118)   PRN Meds:.sodium chloride, acetaminophen **OR** acetaminophen, bisacodyl, polyethylene glycol, sodium chloride flush, sodium chloride flush  No Known Allergies  Social History   Socioeconomic History  . Marital status: Single    Spouse name: Not on file  . Number of children: Not on file  . Years of education: Not on file  . Highest education level: Not on file  Occupational History  . Not on file  Tobacco Use  . Smoking status: Former Games developer  . Smokeless tobacco: Never Used  Substance and Sexual Activity  . Alcohol use: No  . Drug use: No  . Sexual activity: Not on file  Other Topics Concern  . Not on file  Social History Narrative  . Not on file   Social Determinants of Health   Financial Resource Strain: Not on file  Food Insecurity: Not on file  Transportation Needs: Not on file  Physical Activity: Not on file  Stress: Not on file  Social Connections: Not on file  Intimate Partner Violence: Not on file    Vitals BP (!) 152/65 (BP Location: Left Arm)   Pulse 66   Temp 98.5 F (36.9 C) (Oral)   Resp 20   Ht 5\' 10"  (1.778 m)   Wt 108.1 kg   SpO2 97%   BMI 34.20 kg/m    Physical Exam Lying in bed, on oxygen [place] 2 L PERRLA Oral mucosa moist,  Chest - decreased air entry in the bases CVS- Normal s1s2, RRR Abdomen - soft, NT Ext - pedal edema +, left great toe superficial ulcer: does not seem to  look infected   Pertinent Microbiology Results for orders placed or performed during the hospital encounter of 05/25/20  Culture, blood (routine x 2)     Status: None (Preliminary result)   Collection Time: 05/25/20 10:50 AM   Specimen: BLOOD RIGHT ARM  Result Value Ref Range Status   Specimen Description BLOOD RIGHT ARM  Final  Special Requests   Final    BOTTLES DRAWN AEROBIC AND ANAEROBIC Blood Culture results may not be optimal due to an inadequate volume of blood received in culture bottles   Culture   Final    NO GROWTH < 24 HOURS Performed at [facility], [address] [place], [place] [address]    Report Status PENDING  Incomplete  Culture, blood (routine x 2)     Status: None (Preliminary result)   Collection Time: 05/25/20 10:56 AM   Specimen: BLOOD RIGHT HAND  Result Value Ref Range Status   Specimen Description BLOOD RIGHT HAND  Final   Special Requests   Final    BOTTLES DRAWN AEROBIC AND ANAEROBIC Blood Culture results may not be optimal due to an inadequate volume of blood received in culture bottles   Culture   Final    NO GROWTH < 24 HOURS Performed at [facility], [address] [place], [place] [address]    Report Status PENDING  Incomplete  SARS CORONAVIRUS 2 (TAT 6-24 HRS) Nasopharyngeal Nasopharyngeal Swab     Status: None   Collection Time: 05/25/20  9:00 PM   Specimen: Nasopharyngeal Swab  Result Value Ref Range Status   SARS Coronavirus 2 NEGATIVE NEGATIVE Final    Comment: (NOTE) SARS-CoV-2 target nucleic acids are NOT DETECTED.  The SARS-CoV-2 RNA is generally detectable in upper and lower respiratory specimens during the acute phase of infection. Negative results do not preclude SARS-CoV-2 infection, do not rule out co-infections with other pathogens, and should not be used as the sole basis for treatment or other patient management decisions. Negative results must be combined with clinical observations, patient history, and  epidemiological information. The expected result is Negative.  Fact Sheet for Patients: HairSlick.no  Fact Sheet for Healthcare Providers: quierodirigir.com  This test is not yet approved or cleared by the [place] FDA and  has been authorized for detection and/or diagnosis of SARS-CoV-2 by FDA under an Emergency Use Authorization (EUA). This EUA will remain  in effect (meaning this test can be used) for the duration of the COVID-19 declaration under Se ction 564(b)(1) of the Act, 21 U.S.C. section 360bbb-3(b)(1), unless the authorization is terminated or revoked sooner.  Performed at [facility], [address], [place], [place] [address]   Respiratory (~20 pathogens) panel by PCR     Status: None   Collection Time: 05/26/20  8:54 AM   Specimen: Nasopharyngeal Swab; Respiratory  Result Value Ref Range Status   Adenovirus NOT DETECTED NOT DETECTED Final   Coronavirus 229E NOT DETECTED NOT DETECTED Final    Comment: (NOTE) The Coronavirus on the Respiratory Panel, DOES NOT test for the novel  Coronavirus (2019 nCoV)    Coronavirus HKU1 NOT DETECTED NOT DETECTED Final   Coronavirus NL63 NOT DETECTED NOT DETECTED Final   Coronavirus OC43 NOT DETECTED NOT DETECTED Final   Metapneumovirus NOT DETECTED NOT DETECTED Final   Rhinovirus / Enterovirus NOT DETECTED NOT DETECTED Final   Influenza A NOT DETECTED NOT DETECTED Final   Influenza B NOT DETECTED NOT DETECTED Final   Parainfluenza Virus 1 NOT DETECTED NOT DETECTED Final   Parainfluenza Virus 2 NOT DETECTED NOT DETECTED Final   Parainfluenza Virus 3 NOT DETECTED NOT DETECTED Final   Parainfluenza Virus 4 NOT DETECTED NOT DETECTED Final   Respiratory Syncytial Virus NOT DETECTED NOT DETECTED Final   Bordetella pertussis NOT DETECTED NOT DETECTED Final   Bordetella Parapertussis NOT DETECTED  NOT DETECTED Final   Chlamydophila pneumoniae NOT DETECTED NOT DETECTED  Final   Mycoplasma pneumoniae NOT DETECTED NOT DETECTED Final    Comment: Performed at [facility], [address] [place], [place] [address]      Pertinent Lab seen by me: CBC Latest Ref Rng & Units 05/27/2020 05/26/2020 05/25/2020  WBC 4.0 - 10.5 K/uL 12.4(H) 13.7(H) 18.6(H)  Hemoglobin 13.0 - 17.0 g/dL 10.1(L) 11.1(L) 11.8(L)  Hematocrit 39.0 - 52.0 % 32.1(L) 35.0(L) 37.7(L)  Platelets 150 - 400 K/uL 178 207 250   CMP Latest Ref Rng & Units 05/27/2020 05/26/2020 05/25/2020  Glucose 70 - 99 mg/dL 093(A) 355(D) 322(G)  BUN 8 - 23 mg/dL 20 16 14   Creatinine 0.61 - 1.24 mg/dL 2.54 2.70  Sodium 135 - 145 mmol/L 142 139 141  Potassium 3.5 - 5.1 mmol/L 3.0(L) 3.5 3.0(L)  Chloride 98 - 111 mmol/L 112(H) 110 106  CO2 22 - 32 mmol/L 24 20(L) 20(L)  Calcium 8.9 - 10.3 mg/dL 6.23) 7.6(E) 9.1  Total Protein 6.5 - 8.1 g/dL - - 6.7  Total Bilirubin 0.3 - 1.2 mg/dL - - 1.8(H)  Alkaline Phos 38 - 126 U/L - - 78  AST 15 - 41 U/L - - 25  ALT 0 - 44 U/L - - 17    Pertinent Imagings/Other Imagings Plain films and CT images have been personally visualized and interpreted; radiology reports have been reviewed. Decision making incorporated into the Impression / Recommendations.  Chest Xray 05/25/20 IMPRESSION: Mild bibasilar subsegmental atelectasis or possibly pulmonary edema is noted.   CT soft tissue neck 05/25/20 IMPRESSION: 1. No evidence of mucosal or submucosal mass lesion. No tonsillitis or epiglottitis. No airway compromise seen to explain stridor. 2. Atherosclerotic calcification at both carotid bifurcations, not evaluated in detail using this technique.  CT Chest 05/25/20 IMPRESSION: 1. Bilateral pleural effusions layering dependently. Dependent pulmonary atelectasis. 2. Cardiomegaly. Extensive coronary artery calcification. Pacemaker in place. 3. Narrowing of the trachea just proximal to the carina and narrowing of the mainstem bronchi consistent  with tracheobronchomalacia. 4. Old appearing compression deformities at T1 and T3. 5. Aortic atherosclerosis.  Aortic Atherosclerosis (ICD10-I70.0).  TTE 04/17/20 1. Left ventricular ejection fraction, by estimation, is 60 to 65%. The left ventricle has normal function. The left ventricle has no regional wall motion abnormalities. There is mild left ventricular hypertrophy. 2. Right ventricular systolic function is normal. The right ventricular size is normal. 3. Left atrial size was moderately dilated. 4. The mitral valve is normal in structure. Trivial mitral valve regurgitation. No evidence of mitral stenosis. 5. The aortic valve is normal in structure. There is moderate calcification of the aortic valve. There is moderate thickening of the aortic valve. Aortic valve regurgitation is not visualized. Mild to moderate aortic valve sclerosis/calcification is present, without any evidence of aortic stenosis. 6. The inferior vena cava is dilated in size with <50% respiratory variability, suggesting right atrial pressure of 15 mmHg.   I have spent 60 minutes for this patient encounter including review of prior medical records with greater than 50% of time being face to face and coordination of their care.  Electronically signed by:   04/19/20, MD Infectious Disease Physician [facility] Pager: [phone]

## 2020-05-27 NOTE — Progress Notes (Signed)
SLP Cancellation Note  Patient Details Name: Adam Conner MRN: [id number] DOB: 22-Mar-1933   Cancelled treatment:       Reason Eval/Treat Not Completed: Other (comment) (Pt's case was discussed with RN who indicated that the pt is inadequately alert for p.o. intake a this time and is therefore "choking on applesauce". Pt was asymptomatic for aspiration on 4/5 when he was fully alert. SLP questions the impact of his alertness on his performance. SLP will follow up on subsequent date to assess whether a re-evaluation is still clinically indicated once his level of alertness improves.)  Glenna Brunkow I. Vear Clock, MS, CCC-SLP Acute Rehabilitation Services Office number [phone] Pager [phone]  Scheryl Marten 05/27/2020, 1:25 PM

## 2020-05-28 DIAGNOSIS — R061 Stridor: Secondary | ICD-10-CM | POA: Diagnosis not present

## 2020-05-28 DIAGNOSIS — J9811 Atelectasis: Secondary | ICD-10-CM | POA: Diagnosis not present

## 2020-05-28 LAB — CBC WITH DIFFERENTIAL/PLATELET
Abs Immature Granulocytes: 0.09 10*3/uL — ABNORMAL HIGH (ref 0.00–0.07)
Basophils Absolute: 0.1 10*3/uL (ref 0.0–0.1)
Basophils Relative: 1 %
Eosinophils Absolute: 0.8 10*3/uL — ABNORMAL HIGH (ref 0.0–0.5)
Eosinophils Relative: 6 %
HCT: 32.1 % — ABNORMAL LOW (ref 39.0–52.0)
Hemoglobin: 10.2 g/dL — ABNORMAL LOW (ref 13.0–17.0)
Immature Granulocytes: 1 %
Lymphocytes Relative: 6 %
Lymphs Abs: 0.7 10*3/uL (ref 0.7–4.0)
MCH: 29.1 pg (ref 26.0–34.0)
MCHC: 31.8 g/dL (ref 30.0–36.0)
MCV: 91.7 fL (ref 80.0–100.0)
Monocytes Absolute: 1.3 10*3/uL — ABNORMAL HIGH (ref 0.1–1.0)
Monocytes Relative: 11 %
Neutro Abs: 9.5 10*3/uL — ABNORMAL HIGH (ref 1.7–7.7)
Neutrophils Relative %: 75 %
Platelets: 199 10*3/uL (ref 150–400)
RBC: 3.5 MIL/uL — ABNORMAL LOW (ref 4.22–5.81)
RDW: 14.4 % (ref 11.5–15.5)
WBC: 12.5 10*3/uL — ABNORMAL HIGH (ref 4.0–10.5)
nRBC: 0 % (ref 0.0–0.2)

## 2020-05-28 LAB — BASIC METABOLIC PANEL
Anion gap: 7 (ref 5–15)
BUN: 18 mg/dL (ref 8–23)
CO2: 24 mmol/L (ref 22–32)
Calcium: 8.4 mg/dL — ABNORMAL LOW (ref 8.9–10.3)
Chloride: 108 mmol/L (ref 98–111)
Creatinine, Ser: 0.93 mg/dL (ref 0.61–1.24)
GFR, Estimated: >60 mL/min (ref >60)
Glucose, Bld: 114 mg/dL — ABNORMAL HIGH (ref 70–99)
Potassium: 3 mmol/L — ABNORMAL LOW (ref 3.5–5.1)
Sodium: 139 mmol/L (ref 135–145)

## 2020-05-28 LAB — URINALYSIS, ROUTINE W REFLEX MICROSCOPIC
Bacteria, UA: NONE SEEN
Bilirubin Urine: NEGATIVE
Glucose, UA: NEGATIVE mg/dL
Ketones, ur: 5 mg/dL — AB
Leukocytes,Ua: NEGATIVE
Nitrite: NEGATIVE
Protein, ur: NEGATIVE mg/dL
Specific Gravity, Urine: 1.025 (ref 1.005–1.030)
pH: 6 (ref 5.0–8.0)

## 2020-05-28 LAB — PROCALCITONIN: Procalcitonin: 0.13 ng/mL

## 2020-05-28 LAB — MAGNESIUM: Magnesium: 1.6 mg/dL — ABNORMAL LOW (ref 1.7–2.4)

## 2020-05-28 MED ORDER — MAGNESIUM SULFATE 2 GM/50ML IV SOLN
2.0000 g | Freq: Once | INTRAVENOUS | Status: AC
  Administered 2020-05-28: 2 g via INTRAVENOUS
  Filled 2020-05-28: qty 50

## 2020-05-28 MED ORDER — DOXYCYCLINE HYCLATE 100 MG PO TABS: 100.0000 mg | ORAL_TABLET | Freq: Two times a day (BID) | ORAL | Status: DC

## 2020-05-28 MED ORDER — IPRATROPIUM-ALBUTEROL 0.5-2.5 (3) MG/3ML IN SOLN
3.0000 mL | Freq: Two times a day (BID) | RESPIRATORY_TRACT | Status: DC
  Administered 2020-05-29 – 2020-05-30 (×3): 3 mL via RESPIRATORY_TRACT
  Filled 2020-05-28 (×3): qty 3

## 2020-05-28 MED ORDER — POTASSIUM CHLORIDE 10 MEQ/100ML IV SOLN
10.0000 meq | INTRAVENOUS | Status: AC
  Administered 2020-05-28 (×4): 10 meq via INTRAVENOUS
  Filled 2020-05-28 (×4): qty 100

## 2020-05-28 NOTE — Progress Notes (Signed)
[facility] Infectious Diseases Follow Up Note  Patient Identification: Patient Name: Adam Conner MRN: [id number] Admit Date: 05/25/2020 10:00 AM Age: 87 y.o.Today's Date: 05/28/2020   Reason for Visit: Follow up on MRSA bacteremia   Principal Problem:   Stridor Active Problems:   Essential hypertension   Benign prostatic hyperplasia   Dementia without behavioral disturbance (HCC)   Presence of cardiac pacemaker   MRSA bacteremia   Alcohol dependence with uncomplicated withdrawal (HCC)   Diabetic foot ulcer (HCC)   PAF (paroxysmal atrial fibrillation) (HCC)   Acute respiratory failure with hypoxia (HCC)   Pleural effusion   Hypokalemia   Tracheal stenosis  Antibiotics: cefepime 4/4-c                    Vancomycin 4/4-c                    Metronidazole 4/5-4/7  Lines/Tubes: PICC rt arm   Interval Events: afebrile, leukocytosis is stable.    Assessment History of MRSA bacteremia in the setting of pacemaker-not a candidate for pacemaker removal per cardiology per notes from prior admission Bilateral pleural effusion-on diuretics Dementia   Recommendations Continue vancomycin, pharmacy to dose until 05/31/2020 as previously recommended followed by indefinite PO suppression ( For ex doxycycline 100mg  PO BIF) if no plans for removal of PMK  Recommend to DC cefepime, blood cx are negative in 48 hrs A follow up with [facility] has been made ( 4/12 at 9:15 am at [facility] with NP 07-[birth year]-1990) Will sign off for now  Rest of the management as per the primary team. Thank you for the consult. Please page with pertinent questions or concerns.  ______________________________________________________________________ Subjective patient seen and examined at the bedside. He is mumbling and is not coherent   Vitals BP (!) 170/63   Pulse (!) 59   Temp 99.1 F (37.3 C) (Oral)   Resp 18   Ht 5\' 10"  (1.778 m)   Wt 108.1 kg   SpO2 96%    BMI 34.20 kg/m     Physical Exam Lying in bed, on [place], mumbling PERRLA Chest - occasional rales CVS- normal heart sounds Abdomen - soft, NT Ext - minimal pedal edema  Pertinent Microbiology Results for orders placed or performed during the hospital encounter of 05/25/20  Culture, blood (routine x 2)     Status: None (Preliminary result)   Collection Time: 05/25/20 10:50 AM   Specimen: BLOOD RIGHT ARM  Result Value Ref Range Status   Specimen Description BLOOD RIGHT ARM  Final   Special Requests   Final    BOTTLES DRAWN AEROBIC AND ANAEROBIC Blood Culture results may not be optimal due to an inadequate volume of blood received in culture bottles   Culture   Final    NO GROWTH 2 DAYS Performed at [facility], [address] [place], [address] [place]    Report Status PENDING  Incomplete  Culture, blood (routine x 2)     Status: None (Preliminary result)   Collection Time: 05/25/20 10:56 AM   Specimen: BLOOD RIGHT HAND  Result Value Ref Range Status   Specimen Description BLOOD RIGHT HAND  Final   Special Requests   Final    BOTTLES DRAWN AEROBIC AND ANAEROBIC Blood Culture results may not be optimal due to an inadequate volume of blood received in culture bottles   Culture   Final    NO GROWTH 2 DAYS Performed at [facility], [address] [place], [place] [address]    Report Status PENDING  Incomplete  SARS CORONAVIRUS 2 (TAT 6-24 HRS) Nasopharyngeal Nasopharyngeal Swab     Status: None   Collection Time: 05/25/20  9:00 PM   Specimen: Nasopharyngeal Swab  Result Value Ref Range Status   SARS Coronavirus 2 NEGATIVE NEGATIVE Final    Comment: (NOTE) SARS-CoV-2 target nucleic acids are NOT DETECTED.  The SARS-CoV-2 RNA is generally detectable in upper and lower respiratory specimens during the acute phase of infection. Negative results do not preclude SARS-CoV-2 infection, do not rule out co-infections with other pathogens, and should not be used as  the sole basis for treatment or other patient management decisions. Negative results must be combined with clinical observations, patient history, and epidemiological information. The expected result is Negative.  Fact Sheet for Patients: HairSlick.no  Fact Sheet for Healthcare Providers: quierodirigir.com  This test is not yet approved or cleared by the [place] FDA and  has been authorized for detection and/or diagnosis of SARS-CoV-2 by FDA under an Emergency Use Authorization (EUA). This EUA will remain  in effect (meaning this test can be used) for the duration of the COVID-19 declaration under Se ction 564(b)(1) of the Act, 21 U.S.C. section 360bbb-3(b)(1), unless the authorization is terminated or revoked sooner.  Performed at [facility], [address], [place], [place] [address]   Respiratory (~20 pathogens) panel by PCR     Status: None   Collection Time: 05/26/20  8:54 AM   Specimen: Nasopharyngeal Swab; Respiratory  Result Value Ref Range Status   Adenovirus NOT DETECTED NOT DETECTED Final   Coronavirus 229E NOT DETECTED NOT DETECTED Final    Comment: (NOTE) The Coronavirus on the Respiratory Panel, DOES NOT test for the novel  Coronavirus (2019 nCoV)    Coronavirus HKU1 NOT DETECTED NOT DETECTED Final   Coronavirus NL63 NOT DETECTED NOT DETECTED Final   Coronavirus OC43 NOT DETECTED NOT DETECTED Final   Metapneumovirus NOT DETECTED NOT DETECTED Final   Rhinovirus / Enterovirus NOT DETECTED NOT DETECTED Final   Influenza A NOT DETECTED NOT DETECTED Final   Influenza B NOT DETECTED NOT DETECTED Final   Parainfluenza Virus 1 NOT DETECTED NOT DETECTED Final   Parainfluenza Virus 2 NOT DETECTED NOT DETECTED Final   Parainfluenza Virus 3 NOT DETECTED NOT DETECTED Final   Parainfluenza Virus 4 NOT DETECTED NOT DETECTED Final   Respiratory Syncytial Virus NOT DETECTED NOT DETECTED Final   Bordetella  pertussis NOT DETECTED NOT DETECTED Final   Bordetella Parapertussis NOT DETECTED NOT DETECTED Final   Chlamydophila pneumoniae NOT DETECTED NOT DETECTED Final   Mycoplasma pneumoniae NOT DETECTED NOT DETECTED Final    Comment: Performed at [facility], [address], [place], [place] [address]    Pertinent Lab. CBC Latest Ref Rng & Units 05/28/2020 05/27/2020 05/26/2020  WBC 4.0 - 10.5 K/uL 12.5(H) 12.4(H) 13.7(H)  Hemoglobin 13.0 - 17.0 g/dL 10.2(L) 10.1(L) 11.1(L)  Hematocrit 39.0 - 52.0 % 32.1(L) 32.1(L) 35.0(L)  Platelets 150 - 400 K/uL 199 178 207   CMP Latest Ref Rng & Units 05/28/2020 05/27/2020 05/26/2020  Glucose 70 - 99 mg/dL 324(M) 010(U) 725(D)  BUN 8 - 23 mg/dL 18 20 16   Creatinine 0.61 - 1.24 mg/dL 6.64 4.03  Sodium 135 - 145 mmol/L 139 142 139  Potassium 3.5 - 5.1 mmol/L 3.0(L) 3.0(L) 3.5  Chloride 98 - 111 mmol/L 108 112(H) 110  CO2 22 - 32 mmol/L  24 24 20(L)  Calcium 8.9 - 10.3 mg/dL 3.6(U) 4.4(I) 3.4(V)  Total Protein 6.5 - 8.1 g/dL - - -  Total Bilirubin 0.3 - 1.2 mg/dL - - -  Alkaline Phos 38 - 126 U/L - - -  AST 15 - 41 U/L - - -  ALT 0 - 44 U/L - - -     Pertinent Imaging today Plain films and CT images have been personally visualized and interpreted; radiology reports have been reviewed. Decision making incorporated into the Impression / Recommendations.  Chest Xray 05/28/20 FINDINGS: Right PICC line and left pacer remain in place, unchanged. Cardiomegaly, vascular congestion. Bibasilar atelectasis. Findings similar to prior study.  IMPRESSION: Cardiomegaly with vascular congestion.  Bibasilar atelectasis.  I have spent approx 30 minutes for this patient encounter including review of prior medical records, coordination of care  with greater than 50% of time being face to face/counseling and discussing diagnostics/treatment plan with the patient/family.  Electronically signed by:   Odette Fraction, MD Infectious Disease Physician [facility] for Infectious Disease Pager: [phone]

## 2020-05-28 NOTE — Plan of Care (Signed)
  Problem: Clinical Measurements: Goal: Respiratory complications will improve Outcome: Progressing   

## 2020-05-28 NOTE — Evaluation (Signed)
Clinical/Bedside Swallow Evaluation Patient Details  Name: Adam Conner MRN: [id number] Date of Birth: 1933-08-18  Today's Date: 05/28/2020 Time: SLP Start Time (ACUTE ONLY): 1538 SLP Stop Time (ACUTE ONLY): 1553 SLP Time Calculation (min) (ACUTE ONLY): 15 min  Past Medical History:  Past Medical History:  Diagnosis Date  . Bacteremia   . Benign prostatic hyperplasia without lower urinary tract symptoms   . Cognitive communication deficit   . COVID-19   . Dementia (HCC)   . Hypertension   . Metabolic encephalopathy   . Methicillin resistant Staphylococcus aureus infection as the cause of diseases classified elsewhere   . Muscle weakness (generalized)   . Other abnormalities of gait and mobility   . Unsteadiness on feet    Past Surgical History:  Past Surgical History:  Procedure Laterality Date  . PACEMAKER INSERTION     HPI:  Adam Conner is an 87 y.o. male with PMH significant for recent discharge last month after treatment of MRSA bacteremia secondary to OM, DM2, HTN, atrial fibrillation, dementia, Covid 03/2020, admitted for stridor. Chest CT revealed narrowing of the trachea just proximal to the carina and narrowing of the mainstem bronchi consistent with tracheobronchomalacia. Pt seen 4/5 for BSE not revealing s/s aspiration and ST signed off. Pt became lethargic on 4/6 and 4/7 RN stated he was clearing his throat frequently with po's.   Assessment / Plan / Recommendation Clinical Impression  Pt has history of tracheobronchomalacia and dementia both can increase his risk of aspiration. Pt also speaks with food in oral cavity and immediately after swallow thus prematurely opening his glottis. Today he consumed multiple straw sips thin and several trials of regular solid texture. He did not cough or throat clear. Vocal quality questionably wet on once occasion. To consume safely, pt will need to have 1:1 supervision/assist for self feeding and cue for rate, no phonating (if  possible) and positioning. Recommend continue regular texture, thin liquids, pills with thin. Follow up is not recommended at this time. If he is overtly and consistently coughing, signs of developing pna, chest congestion an MBS can be performed. SLP Visit Diagnosis: Dysphagia, unspecified (R13.10)    Aspiration Risk  Mild aspiration risk    Diet Recommendation Regular;Thin liquid   Liquid Administration via: Cup;Straw Medication Administration: Whole meds with puree Supervision: Staff to assist with self feeding Compensations: Minimize environmental distractions Postural Changes: Seated upright at 90 degrees    Other  Recommendations Oral Care Recommendations: Oral care BID   Follow up Recommendations None      Frequency and Duration            Prognosis        Swallow Study   General Date of Onset: 05/25/20 HPI: Adam Conner is an 87 y.o. male with PMH significant for recent discharge last month after treatment of MRSA bacteremia secondary to OM, DM2, HTN, atrial fibrillation, dementia, Covid 03/2020, admitted for stridor. Chest CT revealed narrowing of the trachea just proximal to the carina and narrowing of the mainstem bronchi consistent with tracheobronchomalacia. Pt seen 4/5 for BSE not revealing s/s aspiration and ST signed off. Pt became lethargic on 4/6 and 4/7 RN stated he was clearing his throat frequently with po's. Type of Study: Bedside Swallow Evaluation Previous Swallow Assessment:  (see HPI) Diet Prior to this Study: Regular;Thin liquids Temperature Spikes Noted: No Respiratory Status: Nasal cannula History of Recent Intubation: No Behavior/Cognition: Alert;Cooperative;Pleasant mood;Distractible;Requires cueing Oral Cavity Assessment: Within Functional Limits Oral Care Completed  by SLP: No Oral Cavity - Dentition: Missing dentition Vision: Functional for self-feeding Self-Feeding Abilities: Needs assist;Able to feed self Patient Positioning: Upright in  bed Baseline Vocal Quality: Normal Volitional Cough: Cognitively unable to elicit    Oral/Motor/Sensory Function Overall Oral Motor/Sensory Function: Within functional limits   Ice Chips Ice chips: Not tested   Thin Liquid Thin Liquid: Within functional limits Presentation: Straw    Nectar Thick Nectar Thick Liquid: Not tested   Honey Thick Honey Thick Liquid: Not tested   Puree Puree: Not tested   Solid     Solid: Within functional limits      Royce Macadamia 05/28/2020,5:49 PM

## 2020-05-28 NOTE — Progress Notes (Addendum)
PROGRESS NOTE    Adam Conner  RUE:[id number] DOB: 10/13/1933 DOA: 05/25/2020 PCP: Patient, No Pcp Per (Inactive)   Chief Complaint  Patient presents with  . Respiratory Distress  Brief Narrative:  87 year old male with dementia, hypertension, BPH, generalized muscle weakness/deconditioning, PAF on Xarelto, hypertension, hyperlipidemia, morbid obesity, brought to the ED from cannula placed on 4/4 for respiratory distress.  Recently admitted 2/24-3/4 MRSA bacteremia and was seen by ID, EP, he is a poor surgical candidate and EP did not recommend removal of pacemaker and recommended oral suppressive therapy, ID advised 6 weeks of vancomycin followed by oral suppressive therapy with doxycycline lifelong.  He was discharged to [facility] a skilled nursing facility. In the ED patient was febrile 101.3, with leukocytosis, lactic acidosis chest x-ray with mild bibasilar subsegmental atelectasis or possible pulmonary edema, started on broad-spectrum antibiotics vancomycin cefepime Flagyl due to concern for pneumonia. Given stridor distress patient was consulted by ENT and pulmonary CCM team-?  Stridor likely from tracheobronchial narrowing.  He has been confused and more lethargic-ABG and lab work TSH ammonia level and were unremarkable  Subjective: Seen and examined this morning.  Resting comfortably.  He appears more alert awake and in fact  Bit more agitated and was angry that I was examining him.  He is able to tell me his name but he does not know why he is here  Or where he is at On 2 L nasal cannula oxygen respiratory status at baseline stable.  Assessment & Plan:  Stridor/acute respiratory distress-acute respiratory failure with hypoxia /Tracheobronchomalacia:CT chest and soft tissue neck no abnormality in the neck or subglottis, ENT failed no impending airway compromise,ENT was not able to evaluate the true vocal cords due to patient's lack of cooperation and unwillingness to consent.Was  seen by pulmonary-felt that it is related to tracheobronchomalacia upper airway which could be function of aging and dementia related to loss of muscle tone and likely acute on chronic diastolic dysfunction as evidenced by pleural effusion advised steroids for 7 to 12 days twice daily diuresis.  Seems to be responding well on diuresis. Steroid has not needed.  MRSA bacteremia on last admission 2/24-3/4 in the setting of PM/PM in place-not a candidate for PM removal as per cardiology:Patient has been on vancomycin pharmacy dosing and ID following.Follow-up blood cultures are negative so far.  Hypokalemia Hypomagnesemia: Replacing again Recent Labs  Lab 05/25/20 1009 05/26/20 0340 05/27/20 0329 05/28/20 0419  K 3.0* 3.5 3.0* 3.0*   Anemia likely from chronic disease.  No signs of acute bleeding.  Monitor H&H. Recent Labs  Lab 05/25/20 1009 05/26/20 0340 05/27/20 0329 05/28/20 0419  HGB 11.8* 11.1* 10.1* 10.2*  HCT 37.7* 35.0* 32.1* 32.1*    Pleural effusion bilateral, suspecting diastolic CHF acute:recent echo 2/24 EF 60 to 65%, off HCTZ stopped and placed on IV Lasix.  Repeat chest x-ray showed congestion 05/27/20.  Monitor intake output Daily weight as below. Has net positive balance balance.  Monitor renal function closely while on IV Lasix Filed Weights   05/25/20 1018  Weight: 108.1 kg  Net IO Since Admission: 3,202.99 mL [05/28/20 0820] Recent Labs  Lab 05/25/20 1009 05/26/20 0340 05/27/20 0329 05/28/20 0419  BUN CREATININE 1.01 1.00 0.95 0.93   Acute encephalopathy suspect metabolic in the background of dementia Dementia: He has been more lethargic BUT today slightly more agitated.  His ABG shows no CO2 retention, TSH/ammonia l level/B12 normal, B1 pending ypertension lethargic yesterday but  today will be more agitated.  He has baseline dementia oriented to self. Continue fall precaution supportive care delirium precaution less alert awake.  Consult speech for  swallow evaluation.  Essential hypertension: Blood pressure on higher side.Continue lisinopril  PAF cont on Eliquis.PM in place  ZWC:HENIDPOE Flomax oxybutynin   History of alcohol dependence no signs symptoms symptoms of withdrawal.  He has been hospitalized recently and has been living in a skilled nursing facility since then.  Morbid obesity BMI 35: Will benefit with PCP follow-up and weight loss   Goals of care:patient is elderly debilitated multiple comorbidities remains full code.  Overall prognosis remains guarded with  frequent hospitalization.  I have discussed with patient's POA over the phone, prognosis remains to be seen, palliative care has been consulted, continue on current full scope of treatment.  Diet Order            Diet heart healthy/carb modified Room service appropriate? Yes; Fluid consistency: Thin  Diet effective now                 DVT prophylaxis: rivaroxaban (XARELTO) tablet 20 mg Start: 05/25/20 2300 Code Status:   Code Status: Full Code  Family Communication: plan of care discussed with patient at bedside. Updated daughter POA.  Status is: Inpatient Remains inpatient appropriate because:Ongoing diagnostic testing needed not appropriate for outpatient work up, IV treatments appropriate due to intensity of illness or inability to take PO and Inpatient level of care appropriate due to severity of illness  Dispo: The patient is from: SNF              Anticipated d/c is to: SNF              Patient currently is not medically stable to d/c.   Difficult to place patient No  Unresulted Labs (From admission, onward)          Start     Ordered   05/27/20 1707  Vitamin B1  Add-on,   AD       Question:  Specimen collection method  Answer:  IV Team=IV Team collect   05/27/20 1706   05/27/20 0500  Basic metabolic panel  Daily,   R     Question:  Specimen collection method  Answer:  Unit=Unit collect   05/26/20 1436   05/27/20 0500  CBC with  Differential/Platelet  Daily,   R     Question:  Specimen collection method  Answer:  Unit=Unit collect   05/26/20 1436   05/27/20 0500  Magnesium  Daily,   R     Question:  Specimen collection method  Answer:  Unit=Unit collect   05/26/20 1436        Medications reviewed:  Scheduled Meds: . Chlorhexidine Gluconate Cloth  6 each Topical Daily  . furosemide  40 mg Intravenous Daily  . ipratropium-albuterol  3 mL Nebulization Q6H  . lisinopril  20 mg Oral QHS  . magnesium oxide  400 mg Oral Daily  . mouth rinse  15 mL Mouth Rinse BID  . oxybutynin  5 mg Oral QHS  . pantoprazole (PROTONIX) IV  40 mg Intravenous Q24H  . rivaroxaban  20 mg Oral QHS  . sodium chloride flush  10-40 mL Intracatheter Q12H  . sodium chloride flush  3 mL Intravenous Q12H  . tamsulosin  0.4 mg Oral QPC breakfast  . thiamine  100 mg Oral q AM   Continuous Infusions: . sodium chloride    . ceFEPime (MAXIPIME)  IV 2 g (05/28/20 0309)  . magnesium sulfate bolus IVPB    . potassium chloride    . vancomycin 1,000 mg (05/28/20 0115)    Consultants:see note  Procedures:see note  Antimicrobials: Anti-infectives (From admission, onward)   Start     Dose/Rate Route Frequency Ordered Stop   05/26/20 1500  metroNIDAZOLE (FLAGYL) IVPB 500 mg  Status:  Discontinued        500 mg 100 mL/hr over 60 Minutes Intravenous Every 8 hours 05/26/20 1413 05/27/20 0842   05/25/20 1930  ceFEPIme (MAXIPIME) 2 g in sodium chloride 0.9 % 100 mL IVPB        2 g 200 mL/hr over 30 Minutes Intravenous Every 8 hours 05/25/20 1310     05/25/20 1430  vancomycin (VANCOREADY) IVPB 1000 mg/200 mL        1,000 mg 200 mL/hr over 60 Minutes Intravenous Every 12 hours 05/25/20 1309     05/25/20 1115  ceFEPIme (MAXIPIME) 2 g in sodium chloride 0.9 % 100 mL IVPB        2 g 200 mL/hr over 30 Minutes Intravenous  Once 05/25/20 1106 05/25/20 1158     Culture/Microbiology    Component Value Date/Time   SDES BLOOD RIGHT HAND 05/25/2020 1056    SPECREQUEST  05/25/2020 1056    BOTTLES DRAWN AEROBIC AND ANAEROBIC Blood Culture results may not be optimal due to an inadequate volume of blood received in culture bottles   CULT  05/25/2020 1056    NO GROWTH 2 DAYS Performed at [facility], [address] [place], [place] [address]    REPTSTATUS PENDING 05/25/2020 1056    Other culture-see note  Objective: Vitals: Today's Vitals   05/27/20 2010 05/27/20 2031 05/28/20 0417 05/28/20 0631  BP: (!) 159/66  (!) 177/60 (!) 170/63  Pulse: (!) 59  63 (!) 59  Resp: 20  18   Temp: 98.4 F (36.9 C)  99.1 F (37.3 C)   TempSrc: Oral  Oral   SpO2: 91% 95% 96%   Weight:      Height:      PainSc:        Intake/Output Summary (Last 24 hours) at 05/28/2020 0820 Last data filed at 05/27/2020 1700 Gross per 24 hour  Intake 1180 ml  Output --  Net 1180 ml   Filed Weights   05/25/20 1018  Weight: 108.1 kg   Weight change:   Intake/Output from previous day: 04/06 0701 - 04/07 0700 In: 1300 [P.O.:600; IV Piggyback:700] Out: -  Intake/Output this shift: No intake/output data recorded. Filed Weights   05/25/20 1018  Weight: 108.1 kg    Examination: General exam: AAOx1, slightly agitated, on 2 L of cannula. HEENT:Oral mucosa moist, Ear/Nose WNL grossly, dentition normal. Respiratory system: bilaterally diminished, mild wheezing, no use of accessory muscle Cardiovascular system: S1 & S2 +, No JVD,. Gastrointestinal system: Abdomen soft, NT,ND, BS+ Nervous System:Alert, awake, moving all extremities and grossly nonfocal Extremities: No edema, distal peripheral pulses palpable.  Skin: No rashes,no icterus. MSK: Normal muscle bulk,tone, power  Data Reviewed: I have personally reviewed following labs and imaging studies CBC: Recent Labs  Lab 05/25/20 1009 05/26/20 0340 05/27/20 0329 05/28/20 0419  WBC 18.6* 13.7* 12.4* 12.5*  NEUTROABS 16.3*  --  9.2* 9.5*  HGB 11.8* 11.1* 10.1* 10.2*  HCT 37.7* 35.0* 32.1* 32.1*   MCV 91.7 92.1 90.9 91.7  PLT 250 207 178 199   Basic Metabolic Panel: Recent Labs  Lab 05/25/20  1009 05/26/20 0340 05/27/20 0329 05/28/20 0419  NA 141 139 142 139  K 3.0* 3.5 3.0* 3.0*  CL 106 110 112* 108  CO2 20* 20* 24 24  GLUCOSE 153* 116* 116* 114*  BUN 14 16 20 18   CREATININE 1.01 1.00 0.95 0.93  CALCIUM 9.1 8.7* 8.7* 8.4*  MG  --   --  1.7 1.6*   GFR: Estimated Creatinine Clearance: 72.8 mL/min (by C-G formula based on SCr of 0.93 mg/dL). Liver Function Tests: Recent Labs  Lab 05/25/20 1009  AST 25  ALT 17  ALKPHOS 78  BILITOT 1.8*  PROT 6.7  ALBUMIN 3.1*   No results for input(s): LIPASE, AMYLASE in the last 168 hours. Recent Labs  Lab 05/27/20 2222  AMMONIA 11   Coagulation Profile: No results for input(s): INR, PROTIME in the last 168 hours. Cardiac Enzymes: No results for input(s): CKTOTAL, CKMB, CKMBINDEX, TROPONINI in the last 168 hours. BNP (last 3 results) No results for input(s): PROBNP in the last 8760 hours. HbA1C: No results for input(s): HGBA1C in the last 72 hours. CBG: No results for input(s): GLUCAP in the last 168 hours. Lipid Profile: No results for input(s): CHOL, HDL, LDLCALC, TRIG, CHOLHDL, LDLDIRECT in the last 72 hours. Thyroid Function Tests: Recent Labs    05/27/20 2220  TSH 0.907   Anemia Panel: Recent Labs    05/27/20 2220  VITAMINB12 1,186*   Sepsis Labs: Recent Labs  Lab 05/25/20 1028 05/25/20 1228 05/26/20 1750 05/27/20 0329 05/28/20 0419  PROCALCITON  --   --  0.30 0.24 0.13  LATICACIDVEN 3.6* 3.1*  --   --   --     Recent Results (from the past 240 hour(s))  Culture, blood (routine x 2)     Status: None (Preliminary result)   Collection Time: 05/25/20 10:50 AM   Specimen: BLOOD RIGHT ARM  Result Value Ref Range Status   Specimen Description BLOOD RIGHT ARM  Final   Special Requests   Final    BOTTLES DRAWN AEROBIC AND ANAEROBIC Blood Culture results may not be optimal due to an inadequate volume  of blood received in culture bottles   Culture   Final    NO GROWTH 2 DAYS Performed at [facility], [address] [place], [place] [address]    Report Status PENDING  Incomplete  Culture, blood (routine x 2)     Status: None (Preliminary result)   Collection Time: 05/25/20 10:56 AM   Specimen: BLOOD RIGHT HAND  Result Value Ref Range Status   Specimen Description BLOOD RIGHT HAND  Final   Special Requests   Final    BOTTLES DRAWN AEROBIC AND ANAEROBIC Blood Culture results may not be optimal due to an inadequate volume of blood received in culture bottles   Culture   Final    NO GROWTH 2 DAYS Performed at [facility], [address] [place], [place] [address]    Report Status PENDING  Incomplete  SARS CORONAVIRUS 2 (TAT 6-24 HRS) Nasopharyngeal Nasopharyngeal Swab     Status: None   Collection Time: 05/25/20  9:00 PM   Specimen: Nasopharyngeal Swab  Result Value Ref Range Status   SARS Coronavirus 2 NEGATIVE NEGATIVE Final    Comment: (NOTE) SARS-CoV-2 target nucleic acids are NOT DETECTED.  The SARS-CoV-2 RNA is generally detectable in upper and lower respiratory specimens during the acute phase of infection. Negative results do not preclude SARS-CoV-2 infection, do not rule out co-infections with other  pathogens, and should not be used as the sole basis for treatment or other patient management decisions. Negative results must be combined with clinical observations, patient history, and epidemiological information. The expected result is Negative.  Fact Sheet for Patients: HairSlick.no  Fact Sheet for Healthcare Providers: quierodirigir.com  This test is not yet approved or cleared by the [place] FDA and  has been authorized for detection and/or diagnosis of SARS-CoV-2 by FDA under an Emergency Use Authorization (EUA). This EUA will remain  in effect (meaning this test can be used) for the  duration of the COVID-19 declaration under Se ction 564(b)(1) of the Act, 21 U.S.C. section 360bbb-3(b)(1), unless the authorization is terminated or revoked sooner.  Performed at [facility], [address] [place], [place] [address]   Respiratory (~20 pathogens) panel by PCR     Status: None   Collection Time: 05/26/20  8:54 AM   Specimen: Nasopharyngeal Swab; Respiratory  Result Value Ref Range Status   Adenovirus NOT DETECTED NOT DETECTED Final   Coronavirus 229E NOT DETECTED NOT DETECTED Final    Comment: (NOTE) The Coronavirus on the Respiratory Panel, DOES NOT test for the novel  Coronavirus (2019 nCoV)    Coronavirus HKU1 NOT DETECTED NOT DETECTED Final   Coronavirus NL63 NOT DETECTED NOT DETECTED Final   Coronavirus OC43 NOT DETECTED NOT DETECTED Final   Metapneumovirus NOT DETECTED NOT DETECTED Final   Rhinovirus / Enterovirus NOT DETECTED NOT DETECTED Final   Influenza A NOT DETECTED NOT DETECTED Final   Influenza B NOT DETECTED NOT DETECTED Final   Parainfluenza Virus 1 NOT DETECTED NOT DETECTED Final   Parainfluenza Virus 2 NOT DETECTED NOT DETECTED Final   Parainfluenza Virus 3 NOT DETECTED NOT DETECTED Final   Parainfluenza Virus 4 NOT DETECTED NOT DETECTED Final   Respiratory Syncytial Virus NOT DETECTED NOT DETECTED Final   Bordetella pertussis NOT DETECTED NOT DETECTED Final   Bordetella Parapertussis NOT DETECTED NOT DETECTED Final   Chlamydophila pneumoniae NOT DETECTED NOT DETECTED Final   Mycoplasma pneumoniae NOT DETECTED NOT DETECTED Final    Comment: Performed at [facility], [address] [place], [place] [address]     Radiology Studies: DG Chest Port 1 View  Result Date: 05/27/2020 CLINICAL DATA:  Progressive shortness of breath EXAM: PORTABLE CHEST 1 VIEW COMPARISON:  05/25/2020 FINDINGS: Right PICC line and left pacer remain in place, unchanged. Cardiomegaly, vascular congestion. Bibasilar atelectasis. Findings similar to prior  study. IMPRESSION: Cardiomegaly with vascular congestion. Bibasilar atelectasis. Electronically Signed   By: Charlett Nose M.D.   On: 05/27/2020 18:00     LOS: 3 days   Lanae Boast, MD [facility]  05/28/2020, 8:20 AM

## 2020-05-29 DIAGNOSIS — Z7189 Other specified counseling: Secondary | ICD-10-CM | POA: Diagnosis not present

## 2020-05-29 DIAGNOSIS — Z515 Encounter for palliative care: Secondary | ICD-10-CM | POA: Diagnosis not present

## 2020-05-29 DIAGNOSIS — R061 Stridor: Secondary | ICD-10-CM | POA: Diagnosis not present

## 2020-05-29 LAB — CBC WITH DIFFERENTIAL/PLATELET
Abs Immature Granulocytes: 0.06 10*3/uL (ref 0.00–0.07)
Basophils Absolute: 0.1 10*3/uL (ref 0.0–0.1)
Basophils Relative: 1 %
Eosinophils Absolute: 0.9 10*3/uL — ABNORMAL HIGH (ref 0.0–0.5)
Eosinophils Relative: 7 %
HCT: 34.1 % — ABNORMAL LOW (ref 39.0–52.0)
Hemoglobin: 10.7 g/dL — ABNORMAL LOW (ref 13.0–17.0)
Immature Granulocytes: 1 %
Lymphocytes Relative: 8 %
Lymphs Abs: 0.9 10*3/uL (ref 0.7–4.0)
MCH: 28.7 pg (ref 26.0–34.0)
MCHC: 31.4 g/dL (ref 30.0–36.0)
MCV: 91.4 fL (ref 80.0–100.0)
Monocytes Absolute: 1.3 10*3/uL — ABNORMAL HIGH (ref 0.1–1.0)
Monocytes Relative: 11 %
Neutro Abs: 9 10*3/uL — ABNORMAL HIGH (ref 1.7–7.7)
Neutrophils Relative %: 72 %
Platelets: 226 10*3/uL (ref 150–400)
RBC: 3.73 MIL/uL — ABNORMAL LOW (ref 4.22–5.81)
RDW: 14.3 % (ref 11.5–15.5)
WBC: 12.3 10*3/uL — ABNORMAL HIGH (ref 4.0–10.5)
nRBC: 0 % (ref 0.0–0.2)

## 2020-05-29 LAB — BASIC METABOLIC PANEL
Anion gap: 8 (ref 5–15)
BUN: 17 mg/dL (ref 8–23)
CO2: 24 mmol/L (ref 22–32)
Calcium: 8.6 mg/dL — ABNORMAL LOW (ref 8.9–10.3)
Chloride: 109 mmol/L (ref 98–111)
Creatinine, Ser: 0.89 mg/dL (ref 0.61–1.24)
GFR, Estimated: >60 mL/min (ref >60)
Glucose, Bld: 116 mg/dL — ABNORMAL HIGH (ref 70–99)
Potassium: 3.2 mmol/L — ABNORMAL LOW (ref 3.5–5.1)
Sodium: 141 mmol/L (ref 135–145)

## 2020-05-29 LAB — MAGNESIUM: Magnesium: 2 mg/dL (ref 1.7–2.4)

## 2020-05-29 MED ORDER — POTASSIUM CHLORIDE 20 MEQ PO PACK
40.0000 meq | PACK | Freq: Once | ORAL | Status: AC
  Administered 2020-05-29: 40 meq via ORAL
  Filled 2020-05-29: qty 2

## 2020-05-29 MED ORDER — POTASSIUM CHLORIDE 10 MEQ/100ML IV SOLN
10.0000 meq | INTRAVENOUS | Status: AC
  Administered 2020-05-29 (×2): 10 meq via INTRAVENOUS
  Filled 2020-05-29 (×2): qty 100

## 2020-05-29 MED ORDER — MELATONIN 3 MG PO TABS
3.0000 mg | ORAL_TABLET | Freq: Every day | ORAL | Status: DC
  Administered 2020-05-29 – 2020-06-04 (×7): 3 mg via ORAL
  Filled 2020-05-29 (×7): qty 1

## 2020-05-29 MED ORDER — DOXYCYCLINE HYCLATE 100 MG PO TABS
100.0000 mg | ORAL_TABLET | Freq: Two times a day (BID) | ORAL | Status: DC
  Administered 2020-06-01 – 2020-06-05 (×9): 100 mg via ORAL
  Filled 2020-05-29 (×9): qty 1

## 2020-05-29 NOTE — TOC Progression Note (Signed)
Transition of Care [facility]) - Progression Note    Patient Details  Name: Adam Conner MRN: [id number] Date of Birth: 12-25-1933  Transition of Care [facility]) CM/SW Contact  Bess Kinds, RN Phone Number: [phone] 05/29/2020, 1:12 PM  Clinical Narrative:     Spoke with one of patient's daughters at the bedside in response to palliative request to provide hospice information. Provided hospice list from [email or web address] Daughter and her siblings are looking at options in the [employer] [place] area. Advised that list of agencies can be updated at [email or web address] Daughter was already aware of Medicare.gov as she was reviewing nursing facilities prior to [facility] visit. Discussed basics of hospice at home vs hospice at nursing facility. NCM contact information provided for addition questions. TOC following for transition needs.        Expected Discharge Plan and Services                                                 Social Determinants of Health (SDOH) Interventions    Readmission Risk Interventions No flowsheet data found.

## 2020-05-29 NOTE — Plan of Care (Signed)
  Problem: Clinical Measurements: Goal: Diagnostic test results will improve Outcome: Progressing   Problem: Activity: Goal: Risk for activity intolerance will decrease Outcome: Progressing   Problem: Safety: Goal: Ability to remain free from injury will improve Outcome: Progressing   

## 2020-05-29 NOTE — TOC Initial Note (Signed)
Transition of Care [facility]) - Initial/Assessment Note    Patient Details  Name: KALEP FULL MRN: [id number] Date of Birth: 02/10/1934  Transition of Care [facility]) CM/SW Contact:    Cristobal Goldmann, LCSW Phone Number: 05/29/2020, 1:17 PM  Clinical Narrative:  CSW talked with daughter Donnajean Lopes [place], [place]) at the bedside regarding patient's discharge disposition and continued SNF recommendation. Ms. Elwyn Reach acknowledged that her sister Zakye Baby is POA and that she lives in [place] [phone]). She is in communication with her sister and brother Tyrice [place]itt [place], [place]). The family does not want patient to return to [facility] and would like a facility in [place] where she lives, but will also accept a facility in [place]. CSW informed that prior to his last hospital stay her dad ambulated independently and attended an Adult Day Care at [facility]. She added that he dad had a family member who was his caregiver in the evenings for 10 days and was off 2 days, and she and here siblings would fill-in those 2 days.  Per daughter, she has talked with Marcelino Duster with Palliative Care and their ultimate goal is to have their father at home with Hospice services.    Ms. Elwyn Reach was provided with SNF list for [facility] and she agreed to research SNF's in [place] area and advise CSW of facilities she wanted contacted.           Expected Discharge Plan: Skilled Nursing Facility Barriers to Discharge: Other (comment) (Facility search underway)   Patient Goals and CMS Choice Patient states their goals for this hospitalization and ongoing recovery are:: Family wants patient to get stronger before returning home CMS Medicare.gov Compare Post Acute Care list provided to:: Patient Represenative (must comment) (Daughter [name] at the bedside) Choice offered to / list presented to : Adult Children  Expected Discharge Plan and Services Expected Discharge  Plan: Skilled Nursing Facility In-house Referral: Clinical Social Work     Living arrangements for the past 2 months: Single Family Home                                      Prior Living Arrangements/Services Living arrangements for the past 2 months: Single Family Home Lives with:: Self Patient language and need for interpreter reviewed:: No Do you feel safe going back to the place where you live?: No   Family feels that SNF will benefit patient. Also discussed the possiblity of patient discharging home with hospice services and this was discussed  Need for Family Participation in Patient Care: Yes (Comment) Care giver support system in place?: No (comment) Current home services: DME (Cane, rollator) Criminal Activity/Legal Involvement Pertinent to Current Situation/Hospitalization: No - Comment as needed  Activities of Daily Living Home Assistive Devices/Equipment: Environmental consultant (specify type) ADL Screening (condition at time of admission) Patient's cognitive ability adequate to safely complete daily activities?: No Is the patient deaf or have difficulty hearing?: No Does the patient have difficulty seeing, even when wearing glasses/contacts?: Yes Does the patient have difficulty concentrating, remembering, or making decisions?: Yes Patient able to express need for assistance with ADLs?: Yes Does the patient have difficulty dressing or bathing?: Yes Independently performs ADLs?: No Communication: Dependent Is this a change from baseline?: Pre-admission baseline Dressing (OT): Dependent Is this a change from baseline?: Pre-admission baseline Grooming: Dependent Is this a change from baseline?: Pre-admission baseline Feeding: Dependent Is this a change from  baseline?: Pre-admission baseline Bathing: Dependent Is this a change from baseline?: Pre-admission baseline Toileting: Dependent Is this a change from baseline?: Pre-admission baseline In/Out Bed: Dependent Is this a  change from baseline?: Pre-admission baseline Walks in Home: Needs assistance Is this a change from baseline?: Pre-admission baseline Does the patient have difficulty walking or climbing stairs?: Yes Weakness of Legs: Both Weakness of Arms/Hands: Both  Permission Sought/Granted Permission sought to share information with : Other (comment) (Patient not oriented)                Emotional Assessment Appearance:: Appears older than stated age Attitude/Demeanor/Rapport: Other (comment) (patient was asleep) Affect (typically observed): Other (comment) (Patient was asleep) Orientation: : Oriented to Self Alcohol / Substance Use: Tobacco Use,Alcohol Use,Illicit Drugs (Per H&P Patient quit smoking and does not drink or use illicit drugs) Psych Involvement: No (comment)  Admission diagnosis:  Stridor [R06.1] Hypoxia [R09.02] Congestive heart failure, unspecified HF chronicity, unspecified heart failure type [facility]) [I50.9] Patient Active Problem List   Diagnosis Date Noted  . PAF (paroxysmal atrial fibrillation) (HCC) 05/26/2020  . Acute respiratory failure with hypoxia (HCC) 05/26/2020  . Pleural effusion 05/26/2020  . Hypokalemia 05/26/2020  . Tracheal stenosis   . Stridor 05/25/2020  . Diabetic foot ulcer (HCC)   . MRSA bacteremia 04/20/2020  . Alcohol dependence with uncomplicated withdrawal (HCC) 04/20/2020  . Pacemaker infection (HCC)   . Lactic acidosis 04/17/2020  . Essential hypertension 04/17/2020  . Benign prostatic hyperplasia 04/17/2020  . Dementia without behavioral disturbance (HCC) 04/17/2020  . Presence of cardiac pacemaker 04/17/2020  . COVID-19 virus infection 04/17/2020  . Hyperglycemia 04/17/2020  . Acute cystitis without hematuria 04/17/2020  . Sepsis (HCC) 04/16/2020   PCP:  Patient, No Pcp Per (Inactive) Pharmacy:  No Pharmacies Listed   Social Determinants of Health (SDOH) Interventions  None needed or requested at this time.  Readmission Risk  Interventions No flowsheet data found.

## 2020-05-29 NOTE — Care Management Important Message (Signed)
Important Message  Patient Details  Name: Adam Conner MRN: [id number] Date of Birth: [birth year]   Medicare Important Message Given:  Yes - Important Message mailed due to current National Emergency  Verbal consent obtained due to current National Emergency  Relationship to patient: Child Contact Name: Alida Call Date: 05/29/20  Time: 0955 Phone: [phone] Outcome: No Answer/Busy Important Message mailed to: Patient address on file     Orson Aloe 05/29/2020, 10:20 AM

## 2020-05-29 NOTE — Consult Note (Signed)
Palliative Medicine Inpatient Consult Note  Reason for consult:  Goals of Care  HPI:  Per intake H&P --> Adam Conner is an 87 y.o. male with PMH significant for recent discharge last month after treatment of MRSA bacteremia secondary to OM, presently on IV vancomycin, DM2, HTN, atrial fibrillation, dementia, diabetic foot ulcer who was brought to the ED by EMS when they were called for stridor.  Patient is unable to provide a history due to his dementia.  Per ED documentation, patient was found to be stridorous by the nursing home and when EMS got there he needed to be placed on CPAP with need for racemic epi and IM epi as well as DuoNeb.  O2 sats were apparently in the mid 60s although accuracy was unclear per EMS report.  By the time the patient came to the ED, he was breathing much more comfortably and was able to be taken off of CPAP.  He is presently comfortable on 2 L nasal cannula.  Palliative care was asked to get involved to further address goals of care.  Clinical Assessment/Goals of Care:  *Please note that this is a verbal dictation therefore any spelling or grammatical errors are due to the "[employer] One" system interpretation.  I have reviewed medical records including EPIC notes, labs and imaging, received report from bedside RN, assessed the patient who is pleasantly confused and unable to name his daughter who is next to [place].    I met with Adam Conner and his daughter, Adam Conner to further discuss diagnosis prognosis, GOC, EOL wishes, disposition and options.   I introduced Palliative Medicine as specialized medical care for people living with serious illness. It focuses on providing relief from the symptoms and stress of a serious illness. The goal is to improve quality of life for both the patient and the family.  Adam Conner is from [facility]. He was married to his spouse for fourteen years prior to divorce. He is a former Designer, television/film set. He has  two daughters and one son. His daughter Adam Conner lived in [place], his son, Adam Conner lives in [place], and his daughter Adam Conner lives in [place]. He enjoys football and being outside. He is a man of faith and practices within the [employer] denomination.   Prior to admission he had been living in his home. He attended an adult daycare five days a week and otherwise was in his home for with an evening caregiver. He is ambulatory and "walks" all of the time.   Adam Conner and I discuss Adam Conner decline which has been progressive over the past few years. He has suffered from ETOH dementia. He has suffered from MRSA bacteremia more recently and battled some complicated delirium. He has started a downward decline without improvements. Per Adam Conner his recent stay at [facility] place went very poorly and improvements were minimal at best. We discussed whether or not another SNF stay would benefit [name] and further reviewed that he has a high likelihood of becoming delirious again.   A detailed discussion was had today regarding advanced directives we do not have any on file in [name] though Br[place]hy shares that here sister Adam Conner is the patients medical POA.    Concepts specific to code status, artifical feeding and hydration, continued IV antibiotics and rehospitalization was had.  I presented a MOST form one had already been completed though per this it said Full Code status. I reviewed what cardiopulmonary resuscitation looks like. Per Adam Conner she feels her father would likely  wish to be DNR though this is a conversation that should be had with Adam Conner.  The difference between a aggressive medical intervention path  and a palliative comfort care path for this patient at this time was had. We reviewed hospice.  I described hospice as a service for patients for have a life expectancy of < 33months. It preserves dignity and quality at the end phases of life. The focus changes from curative to symptom relief.   At this point  our conversation circulated around potentially trialing SNF again and then if Adam Conner does poorly allowing him to go home with hospice.  Discussed the importance of continued conversation with family and their  medical providers regarding overall plan of care and treatment options, ensuring decisions are within the context of the patients values and GOCs. _________________________________________________ Addendum  I called Adam Conner daughter, Adam Conner this afternoon to introduce Palliative care to her and review the conversation which was held with her sister. She asked if she could call me back due to her not being the only person in her care at that time and being on speaker phone.   Decision Maker: Adam Conner (Daughter) [phone]  SUMMARY OF RECOMMENDATIONS   Full Code - Patients POA is coming to town and we will further discuss this throughout the weekend. Recommended DNR considerations. Has a MOST with limitations on intubation.   Plan for short term SNF stay  Plan for meeting with MPOA, Adam Conner this weekend. She will arrive this evening  TOC - OP Palliative care and to provide family information on home hospice options  Ongoing conversations in the oncoming days  Code Status/Advance Care Planning: FULL CODE   Palliative Prophylaxis:   Oral Care, Mobility  Additional Recommendations (Limitations, Scope, Preferences):  Continue current measures of care  Psycho-social/Spiritual:   Desire for further Chaplaincy support: No  Additional Recommendations: Education on chronic disease and delirium   Prognosis: Poor in the setting of recurrent hospitalizations, frailty, multiple comorbid conditions.  Discharge Planning: Discharge likely to SNF with OP Palliative care.   Vitals:   05/28/20 2015 05/29/20 0433  BP: (!) 149/59 (!) 161/57  Pulse: 81 60  Resp: 18 20  Temp: 99.1 F (37.3 C) 98.9 F (37.2 C)  SpO2: 95% (!) 88%    Intake/Output Summary (Last 24 hours) at 05/29/2020  9198 Last data filed at 05/29/2020 0600 Gross per 24 hour  Intake 900 ml  Output 2750 ml  Net -1850 ml   Last Weight  Most recent update: 05/25/2020 10:19 AM   Weight  108.1 kg (238 lb 5.1 oz)           Gen:  Elderly M in NAD HEENT: moist mucous membranes CV: Regular rate and rhythm  PULM: 2LPM [place] ABD: soft/nontender  EXT: No edema  Neuro: Alert and oriented x1  PPS: 30%   This conversation/these recommendations were discussed with patient primary care team, Dr. Jonathon Bellows  Time In: 1140 Time Out: 1250 Total Time: 70 Greater than 50%  of this time was spent counseling and coordinating care related to the above assessment and plan.  Lamarr Lulas [facility] Palliative Medicine Team Team Cell Phone: [phone] Please utilize secure chat with additional questions, if there is no response within 30 minutes please call the above phone number  Palliative Medicine Team providers are available by phone from 7am to 7pm daily and can be reached through the team cell phone.  Should this patient require assistance outside of these hours, please call the  patient's attending physician.

## 2020-05-29 NOTE — Progress Notes (Signed)
PHARMACY CONSULT NOTE FOR:  OUTPATIENT  PARENTERAL ANTIBIOTIC THERAPY (OPAT)  Indication: Bacteremia Regimen: Vancomycin 1,000mg  IV every 12 hours End date: 05/31/20  IV antibiotic discharge orders are pended. To discharging provider:  please sign these orders via discharge navigator,  Select New Orders & click on the button choice - Manage This Unsigned Work.     Thank you for allowing pharmacy to be a part of this patient's care.  Yvetta Coder, PharmD PGY1 Acute Care Pharmacy Resident

## 2020-05-29 NOTE — Progress Notes (Signed)
Occupational Therapy Treatment Patient Details Name: Adam Conner MRN: [id number] DOB: 07/10/1933 Today's Date: 05/29/2020    History of present illness 87 y.o. male presents to [facility] ED on 4/4 from [facility] with progressive SOB, stridor, and acute BLE edema. Pt with recent admission for osteomyelitis L foot. Chest x-ray was suggestive of some pulmonary edema and mild bibasilar atelectasis. 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   OT comments  Pt progressing slowly towards acute OT goals. Total A +2 to roll and come from sidelying to EOB. Pt actively resisting BLE advancement and irritated at times but responded well to daughter's encouragement, extra time for each task, and presenting one task at a time. Once EOB pt was able to sit with BUE support about 10 minutes with min guard to min A for static sitting balance. D/c plan remains appropriate.    Follow Up Recommendations  SNF    Equipment Recommendations  None recommended by OT    Recommendations for Other Services      Precautions / Restrictions Precautions Precautions: Fall Precaution Comments: monitor SpO2 Restrictions Weight Bearing Restrictions: No       Mobility Bed Mobility Overal bed mobility: Needs Assistance Bed Mobility: Rolling;Sidelying to Sit;Sit to Supine Rolling: Total assist;+2 for physical assistance Sidelying to sit: Total assist;+2 for physical assistance;HOB elevated   Sit to supine: Total assist;+2 for physical assistance;HOB elevated   General bed mobility comments: Hand over hand guidance for bedrail, pt actively resisting with movement at B LEs    Transfers Overall transfer level: Needs assistance Equipment used: Rolling walker (2 wheeled) Transfers: Sit to/from Stand Sit to  Stand: Total assist;+2 physical assistance;From elevated surface         General transfer comment: Attempted sit to stand x3 but unable to clear hips off bed, decreased anterior lean    Balance Overall balance assessment: Needs assistance Sitting-balance support: Feet supported;Bilateral upper extremity supported Sitting balance-Leahy Scale: Fair Sitting balance - Comments: Able to sit for ~10 min at EOB with periods of min A - min guard for safety.       Standing balance comment: unable to assess                           ADL either performed or assessed with clinical judgement   ADL Overall ADL's : Needs assistance/impaired       Grooming Details (indicate cue type and reason): Pt 2x declined grooming task this session (wiping face with washcloth)                 Toilet Transfer: Total assistance Toilet Transfer Details (indicate cue type and reason): would need Hoyer left           General ADL Comments: With encouragement and extra time pt able to come to EOB position. Total A +2 to come from sidelying to EOB (initially resisted but responded well to daughter's encouragement). Pt able to sit close to 10 minutes with BUE support and min guard to occasional min assist 2/2 posterior lean and difficulty dual tasking.     Vision       Perception     Praxis      Cognition Arousal/Alertness: Awake/alert Behavior During Therapy: Flat affect Overall Cognitive Status: History of cognitive impairments -  at baseline                                 General Comments: Slowed processing, difficulty following commands, did get irritated at times, but family present who helped calm pt        Exercises     Shoulder Instructions       General Comments Attempted scooting, pt unable to clear hips off bed and trying to lean posteriorly, needed total x2 with use of bed pad to scoot    Pertinent Vitals/ Pain       Pain Assessment: Faces Faces  Pain Scale: Hurts little more Pain Location: generalized, B LEs Pain Descriptors / Indicators: Grimacing;Moaning;Guarding Pain Intervention(s): Monitored during session;Repositioned;Limited activity within patient's tolerance  Home Living                                          Prior Functioning/Environment              Frequency  Min 2X/week        Progress Toward Goals  OT Goals(current goals can now be found in the care plan section)  Progress towards OT goals: Progressing toward goals  Acute Rehab OT Goals Patient Stated Goal: to get out of bed OT Goal Formulation: Patient unable to participate in goal setting Time For Goal Achievement: 06/10/20 Potential to Achieve Goals: Good ADL Goals Pt Will Perform Eating: with min assist;sitting Pt Will Perform Grooming: with min assist;sitting Pt Will Perform Upper Body Bathing: with mod assist;sitting Additional ADL Goal #1: Pt will maintain EOB sitting x 10 mins with mod A in prep for ADLs  Plan Discharge plan remains appropriate    Co-evaluation                 AM-PAC OT "6 Clicks" Daily Activity     Outcome Measure   Help from another person eating meals?: Total Help from another person taking care of personal grooming?: A Lot Help from another person toileting, which includes using toliet, bedpan, or urinal?: Total Help from another person bathing (including washing, rinsing, drying)?: Total Help from another person to put on and taking off regular upper body clothing?: Total Help from another person to put on and taking off regular lower body clothing?: Total 6 Click Score: 7    End of Session Equipment Utilized During Treatment: Oxygen  OT Visit Diagnosis: Cognitive communication deficit (R41.841);Unsteadiness on feet (R26.81)   Activity Tolerance Patient limited by lethargy   Patient Left in bed;with call bell/phone within reach;with nursing/sitter in room   Nurse Communication  Mobility status;Other (comment) (NT, condom cath off)        Time: 4098-1191 OT Time Calculation (min): 39 min  Charges: OT General Charges $OT Visit: 1 Visit OT Treatments $Self Care/Home Management : 8-22 mins  Raynald Kemp, OT Acute Rehabilitation Services Pager: [phone] Office: [phone]    Pilar Grammes 05/29/2020, 2:01 PM

## 2020-05-29 NOTE — Progress Notes (Signed)
Physical Therapy Treatment Patient Details Name: Adam Conner MRN: [id number] DOB: Aug 27, 1933 Today's Date: 05/29/2020    History of Present Illness 87 y.o. male presents to [facility] ED on 4/4 from [facility] with progressive SOB, stridor, and acute BLE edema. Pt with recent admission for osteomyelitis L foot. Chest x-ray was suggestive of some pulmonary edema and mild bibasilar atelectasis. 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    PT Comments    Pt received in bed with family present. Needed total Ax2 for all mobility, but demonstrated good sitting balance once at EOB. Pt stating he wanted to try standing, but all 3 attempts were unsuccessful. No initiation to translate trunk forward to clear hips. Pt needing increased time for processing and to respond. Will continue to benefit from PT to increase independency. Left in bed with all needs met, call bell within reach, and family present. Will continue to follow acutely.   Follow Up Recommendations  SNF     Equipment Recommendations  Wheelchair (measurements PT);Wheelchair cushion (measurements PT);Hospital bed (mechanical lift if D/C home)    Recommendations for Other Services       Precautions / Restrictions Precautions Precautions: Fall Precaution Comments: monitor SpO2 Restrictions Weight Bearing Restrictions: No    Mobility  Bed Mobility Overal bed mobility: Needs Assistance Bed Mobility: Rolling;Sidelying to Sit;Sit to Supine Rolling: Total assist;+2 for physical assistance Sidelying to sit: Total assist;+2 for physical assistance;HOB elevated   Sit to supine: Total assist;+2 for physical assistance;HOB elevated   General bed mobility comments: Hand over hand guidance for bedrail, pt actively resisting with  movement at B LEs    Transfers Overall transfer level: Needs assistance Equipment used: Rolling walker (2 wheeled) Transfers: Sit to/from Stand Sit to Stand: Total assist;+2 physical assistance;From elevated surface         General transfer comment: Attempted sit to stand x3 but unable to clear hips off bed, decreased anterior lean  Ambulation/Gait                 Stairs             Wheelchair Mobility    Modified Rankin (Stroke Patients Only)       Balance Overall balance assessment: Needs assistance Sitting-balance support: Feet supported;Bilateral upper extremity supported Sitting balance-Leahy Scale: Fair Sitting balance - Comments: Able to sit for ~10 min at EOB with periods of min A - min guard for safety.       Standing balance comment: unable to assess                            Cognition Arousal/Alertness: Awake/alert Behavior During Therapy: Flat affect Overall Cognitive Status: History of cognitive impairments - at baseline                                 General Comments: Slowed processing, difficulty following commands, did get irritated at times, but family present who helped calm pt      Exercises      General Comments General comments (skin integrity, edema, etc.): Attempted scooting, pt unable to clear hips off bed and trying to lean posteriorly, needed total x2 with use of bed pad to scoot  Pertinent Vitals/Pain Pain Assessment: Faces Faces Pain Scale: Hurts little more Pain Location: generalized, B LEs Pain Descriptors / Indicators: Grimacing;Moaning;Guarding Pain Intervention(s): Monitored during session    Home Living                      Prior Function            PT Goals (current goals can now be found in the care plan section) Acute Rehab PT Goals Patient Stated Goal: to get out of bed PT Goal Formulation: With patient Time For Goal Achievement: 06/09/20 Potential to  Achieve Goals: Fair Progress towards PT goals: Progressing toward goals    Frequency    Min 2X/week      PT Plan Current plan remains appropriate    Co-evaluation              AM-PAC PT "6 Clicks" Mobility   Outcome Measure  Help needed turning from your back to your side while in a flat bed without using bedrails?: Total Help needed moving from lying on your back to sitting on the side of a flat bed without using bedrails?: Total Help needed moving to and from a bed to a chair (including a wheelchair)?: Total Help needed standing up from a chair using your arms (e.g., wheelchair or bedside chair)?: Total Help needed to walk in hospital room?: Total Help needed climbing 3-5 steps with a railing? : Total 6 Click Score: 6    End of Session Equipment Utilized During Treatment: Oxygen;Gait belt Activity Tolerance: Patient limited by fatigue Patient left: in bed;with call bell/phone within reach;with family/visitor present Nurse Communication: Mobility status;Need for lift equipment PT Visit Diagnosis: Other abnormalities of gait and mobility (R26.89);Muscle weakness (generalized) (M62.81)     Time:  -     Charges:                        Rosita Kea, SPT

## 2020-05-29 NOTE — [place] FL2 (Signed)
[place] MEDICAID FL2 LEVEL OF CARE SCREENING TOOL     IDENTIFICATION  Patient Name: Adam Conner Birthdate: 01-11-1934 Sex: male Admission Date (Current Location): 05/25/2020  [facility] and [place] Number:  Producer, television/film/video and Address:  [facility], [address], [place], [place] [address]      Provider Number: [id number]  Attending Physician Name and Address:  Lanae Boast, MD  Relative Name and Phone Number:  Donnajean Lopes- [phone]; POA-Alida Mcmanus-Fenner - [phone]    Current Level of Care: Hospital Recommended Level of Care: Skilled Nursing Facility Prior Approval Number:    Date Approved/Denied:   PASRR Number: [id number]  Discharge Plan: SNF    Current Diagnoses: Patient Active Problem List   Diagnosis Date Noted  . PAF (paroxysmal atrial fibrillation) (HCC) 05/26/2020  . Acute respiratory failure with hypoxia (HCC) 05/26/2020  . Pleural effusion 05/26/2020  . Hypokalemia 05/26/2020  . Tracheal stenosis   . Stridor 05/25/2020  . Diabetic foot ulcer (HCC)   . MRSA bacteremia 04/20/2020  . Alcohol dependence with uncomplicated withdrawal (HCC) 04/20/2020  . Pacemaker infection (HCC)   . Lactic acidosis 04/17/2020  . Essential hypertension 04/17/2020  . Benign prostatic hyperplasia 04/17/2020  . Dementia without behavioral disturbance (HCC) 04/17/2020  . Presence of cardiac pacemaker 04/17/2020  . COVID-19 virus infection 04/17/2020  . Hyperglycemia 04/17/2020  . Acute cystitis without hematuria 04/17/2020  . Sepsis (HCC) 04/16/2020    Orientation RESPIRATION BLADDER Height & Weight     Self  O2 (2L oxygen) Incontinent,External catheter Weight: 238 lb 5.1 oz (108.1 kg) Height:  5\' 10"  (177.8 cm)  BEHAVIORAL SYMPTOMS/MOOD NEUROLOGICAL BOWEL NUTRITION STATUS      Continent Diet (Heart healthy - carb modified)  AMBULATORY STATUS COMMUNICATION OF NEEDS Skin   Total Care (Patient was unable to ambulate with PT)  Verbally Other (Comment),Skin abrasions (Ecchymosis bilateral arms; Abrasion left hand with foam dressing; Contact dermatitis groin; Dried dark spot on tip of left great toe per wound care)                       Personal Care Assistance Level of Assistance  Bathing,Feeding,Dressing Bathing Assistance: Maximum assistance Feeding assistance: Maximum assistance Dressing Assistance: Maximum assistance     Functional Limitations Info  Sight,Hearing,Speech Sight Info: Adequate Hearing Info: Adequate Speech Info: Adequate    SPECIAL CARE FACTORS FREQUENCY  PT (By licensed PT),OT (By licensed OT),Speech therapy     PT Frequency: Evaluated 4/5 - PT at SNF eval and treat a minimum of 5 days per week OT Frequency: Evaluated 4/6. OT at SNF eval and treat, a minimum of 5 days per week     Speech Therapy Frequency: Evaluated 4/5 - Swallow eval - Regular diet, thin liquid recommended      Contractures Contractures Info: Not present    Additional Factors Info  Code Status,Allergies Code Status Info: Full Allergies Info: No known allergies           Current Medications (05/29/2020):  This is the current hospital active medication list Current Facility-Administered Medications  Medication Dose Route Frequency Provider Last Rate Last Admin  . 0.9 %  sodium chloride infusion  250 mL Intravenous PRN 07/29/2020 Tublu, MD      . acetaminophen (TYLENOL) tablet 650 mg  650 mg Oral Q6H PRN Leandro Reasoner, MD   650 mg at 05/25/20 2224   Or  . acetaminophen (TYLENOL) suppository 650 mg  650  mg Rectal Q6H PRN Leandro Reasoner Tublu, MD      . bisacodyl (DULCOLAX) EC tablet 5 mg  5 mg Oral Daily PRN Pieter Partridge, MD      . Chlorhexidine Gluconate Cloth 2 % PADS 6 each  6 each Topical Daily Pieter Partridge, MD   6 each at 05/29/20 1110  . [START ON 05/31/2020] doxycycline (VIBRA-TABS) tablet 100 mg  100 mg Oral Q12H Manandhar, Rozell Searing, MD      .  furosemide (LASIX) injection 40 mg  40 mg Intravenous Daily Lewie Chamber, MD   40 mg at 05/29/20 0943  . ipratropium-albuterol (DUONEB) 0.5-2.5 (3) MG/3ML nebulizer solution 3 mL  3 mL Nebulization BID Kc, Ramesh, MD   3 mL at 05/29/20 0854  . lisinopril (ZESTRIL) tablet 20 mg  20 mg Oral QHS Leandro Reasoner Tublu, MD   20 mg at 05/28/20 2221  . magnesium oxide (MAG-OX) tablet 400 mg  400 mg Oral Daily Leandro Reasoner Tublu, MD   400 mg at 05/29/20 0943  . MEDLINE mouth rinse  15 mL Mouth Rinse BID Leandro Reasoner Tublu, MD   15 mL at 05/29/20 1220  . oxybutynin (DITROPAN-XL) 24 hr tablet 5 mg  5 mg Oral QHS Leandro Reasoner Tublu, MD   5 mg at 05/28/20 2221  . pantoprazole (PROTONIX) injection 40 mg  40 mg Intravenous Q24H Kc, Ramesh, MD   40 mg at 05/29/20 1110  . polyethylene glycol (MIRALAX / GLYCOLAX) packet 17 g  17 g Oral Daily PRN Leandro Reasoner Tublu, MD      . rivaroxaban Carlena Hurl) tablet 20 mg  20 mg Oral QHS Leandro Reasoner Tublu, MD   20 mg at 05/28/20 2221  . sodium chloride flush (NS) 0.9 % injection 10-40 mL  10-40 mL Intracatheter Q12H Leandro Reasoner Tublu, MD   10 mL at 05/29/20 1113  . sodium chloride flush (NS) 0.9 % injection 10-40 mL  10-40 mL Intracatheter PRN Leandro Reasoner Tublu, MD   10 mL at 05/28/20 1301  . sodium chloride flush (NS) 0.9 % injection 3 mL  3 mL Intravenous Q12H Leandro Reasoner Tublu, MD   3 mL at 05/29/20 1114  . sodium chloride flush (NS) 0.9 % injection 3 mL  3 mL Intravenous PRN Leandro Reasoner Tublu, MD      . tamsulosin (FLOMAX) capsule 0.4 mg  0.4 mg Oral QPC breakfast Leandro Reasoner Tublu, MD   0.4 mg at 05/29/20 0943  . thiamine tablet 100 mg  100 mg Oral q AM Pieter Partridge, MD   100 mg at 05/29/20 0943  . vancomycin (VANCOREADY) IVPB 1000 mg/200 mL  1,000 mg Intravenous Q12H Odette Fraction, MD 200 mL/hr at 05/29/20 1219 1,000 mg at 05/29/20 1219     Discharge Medications: Please  see discharge summary for a list of discharge medications.  Relevant Imaging Results:  Relevant Lab Results:   Additional Information ss#[id number]. Patient is fully vaccinated - family has card.  Vaccination dates - Moderna: 03/12/19, 04/09/19, 01/03/20   Cristobal Goldmann, LCSW

## 2020-05-29 NOTE — Progress Notes (Signed)
   Palliative Medicine Inpatient Follow Up Note  This evening, I spoke to patients daughter, Adam Conner on speaker phone with her sister Adam Conner present at bedside.   Adam Conner shares with me that she is on her way into the hospital though cannot confirm that she will be present prior to my leaving.   We briefly reviewed that Adam Conner suffers from MRSA bacteremia secondary to OM from a L foot ulcer, DM2, HTN, CKD II, atrial fibrillation, dementia, and new diagnosis of tracheomalacia. We discussed his decline over the past month. We discussed his frail condition and the consideration that due to his overlying delirium his course at rehabilitation may be fraught with complications.   Adam Conner shares with me that she "wants to know the numbers". I told her her that I would be happy to open the chart so that we may review laboratory results and diagnostic imaging when we meet in person.   Adam Conner confirms that Adam Conner will remain Full Code - she shares that she is not ready to change this and would like to continue with present aggressive care measures.  We plan to meet tomorrow at 10AM for further conversations.  I have, in the meanwhile requested for Dr. Jonathon Bellows to provide a formal medical update.  Time In: 1645 Time Out: 1715 Time Spent: 30 Greater than 50% of the time was spent in counseling and coordination of care ______________________________________________________________________________________ Lamarr Lulas [facility] Palliative Medicine Team Team Cell Phone: [phone] Please utilize secure chat with additional questions, if there is no response within 30 minutes please call the above phone number  Palliative Medicine Team providers are available by phone from 7am to 7pm daily and can be reached through the team cell phone.  Should this patient require assistance outside of these hours, please call the patient's attending physician.

## 2020-05-29 NOTE — TOC Progression Note (Addendum)
Transition of Care [facility]) - Progression Note    Patient Details  Name: Adam Conner MRN: [id number] Date of Birth: Sep 07, 1933  [facility]) CM/SW Contact  Okey Dupre Lazaro Arms, LCSW Phone Number: 05/29/2020, 1:57 PM  Clinical Narrative:  CSW provided with 3 skilled nursing facilities to contact in [place]: [facility], [facility], and [facility] [place].  *[facility] [phone]) Got VM from admissions coordinator requesting that clinicals be faxed if interested in ST rehab. Left voice mail with patient and CSW information and faxed clinicals to [phone].  [facility] [place] [phone]) Spoke with Erie Noe in admissions and was informed that they do have availability. She requested that clinicals be faxed to [phone]. [facility]: Jeanene Erb the facility twice [phone]) and let it ring until a [employer] message came on that no one available.   2:11 pm - Provided daughter with facility responses so far: [facility] and [facility] and declines: [facility], [facility] and [facility].  Daughter responded and indicated that both facilities are rated worse than [facility]. She is hopeful that we hear back from [facility] and/or [facility] in [place].   CSW will continue working with family on placement for [name].    Expected Discharge Plan: Skilled Nursing Facility Barriers to Discharge: Other (comment) Research scientist (medical) underway)  Expected Discharge Plan and Services Expected Discharge Plan: Skilled Nursing Facility In-house Referral: Clinical Social Work     Living arrangements for the past 2 months: Single Family Home                                     Social Determinants of Health (SDOH) Interventions  No SDOH interventions requested or needed at this time.  Readmission Risk Interventions No flowsheet data found.

## 2020-05-29 NOTE — Progress Notes (Addendum)
PROGRESS NOTE    Adam Conner  YTK:[id number] DOB: 06-Feb-1934 DOA: 05/25/2020 PCP: Patient, No Pcp Per (Inactive)   Chief Complaint  Patient presents with   Respiratory Distress  Brief Narrative:  87 year old male with dementia, hypertension, BPH, generalized muscle weakness/deconditioning, PAF on Xarelto, hypertension, hyperlipidemia, morbid obesity, brought to the ED from cannula placed on 4/4 for respiratory distress.  Recently admitted 2/24-3/4 MRSA bacteremia and was seen by ID, EP, he is a poor surgical candidate and EP did not recommend removal of pacemaker and recommended oral suppressive therapy, ID advised 6 weeks of vancomycin followed by oral suppressive therapy with doxycycline lifelong.  He was discharged to [facility] a skilled nursing facility. In the ED patient was febrile 101.3, with leukocytosis, lactic acidosis chest x-ray with mild bibasilar subsegmental atelectasis or possible pulmonary edema, started on broad-spectrum antibiotics vancomycin cefepime Flagyl due to concern for pneumonia. Given stridor distress patient was consulted by ENT and pulmonary CCM team-?  Stridor likely from tracheobronchial narrowing.  He has been confused and more lethargic-ABG and lab work TSH ammonia level and were unremarkable. 4/7 was more alert/mildly agitated.  Subjective: Seen and examined this morning.  He is more alert awake oriented to self.  Does not appear to be agitated Afebrile overnight. He was off oxygen but needed 2 L this morning. Assessment & Plan:  Stridor/acute respiratory distress and failure with hypoxia/Tracheobronchomalacia:CT chest and soft tissue neck no abnormality in the neck or subglottis, ENT failed no impending airway compromise,ENT was not able to evaluate the true vocal cords due to patient's lack of cooperation and unwillingness to consent.Was seen by pulmonary-felt that it is related to tracheobronchomalacia upper airway which could be function of aging and  dementia related to loss of muscle tone and likely acute on chronic diastolic dysfunction as evidenced by pleural effusion advised steroids for 7 to 12 days twice daily diuresis.  Responding very well on IV Lasix.  Not needing steroid.  Continue supplemental oxygen will need 2 L nasal cannula upon discharge.   MRSA bacteremia on last admission 2/24-3/4 in the setting of PM/PM in place-not a candidate for PM removal as per cardiology:Patient has been on vancomycin and will complete 6 weeks therapy on 05/31/2020 following which he will need oral doxycycline lifelong.Follow-up blood cultures are negative so far-off cefepime.  Hypokalemia: We will replete again.  This likely from Lasix. Hypomagnesemia: Replaced at 2.0 Recent Labs  Lab 05/25/20 1009 05/26/20 0340 05/27/20 0329 05/28/20 0419 05/29/20 0415  K 3.0* 3.5 3.0* 3.0* 3.2*   Anemia likely from chronic disease.  No signs of acute bleeding.  Monitor hemoglobin. Recent Labs  Lab 05/25/20 1009 05/26/20 0340 05/27/20 0329 05/28/20 0419 05/29/20 0415  HGB 11.8* 11.1* 10.1* 10.2* 10.7*  HCT 37.7* 35.0* 32.1* 32.1* 34.1*   Pleural effusion bilateral, suspecting diastolic CHF, acute:recent echo 2/24 EF 60 to 65%, off HCTZ stopped and placed on IV Lasix.  Repeat chest x-ray showed congestion 05/27/20.  Continue on IV Lasix-on 40 mg daily IV, monitor intake output Daily weight and renal function closely as below.  Replete electrolytes. Filed Weights   05/25/20 1018  Weight: 108.1 kg  Net IO Since Admission: 1,922.21 mL [05/29/20 1136] Recent Labs  Lab 05/25/20 1009 05/26/20 0340 05/27/20 0329 05/28/20 0419 05/29/20 0415  BUN 14 16 20 18 17   CREATININE 1.01 1.00 0.95 0.93 0.89   Acute encephalopathy suspect metabolic in the background of dementia Dementia:He has been more lethargic BUT was more alert and mildly  agitated 4/7.today he appears more alert awake.  His ABG shows no CO2 retention, TSH/ammonia l level/B12 normal, B1 pending.He  has baseline dementia oriented to self. Continue fall precaution supportive care delirium precaution less alert awake.  Appreciate speech therapy on board   Essential hypertension: BP is fairly controlled at times on higher side.  Continue lisinopril  cont lasix  PAF on Eliquis and Coreg.  XIH:WTUUEKCM Flomax oxybutynin   History of alcohol dependence no signs symptoms symptoms of withdrawal.  He has been hospitalized recently and has been living in a skilled nursing facility since then.  Morbid obesity BMI 35: Will benefit with PCP follow-up and weight loss   Goals of care:patient is elderly debilitated multiple comorbidities remains full code.  Overall prognosis remains guarded with  frequent hospitalization.  I have discussed with patient's POA over the phone, prognosis remains to be seen, palliative care has been consulted, continue on current full scope of treatment.  Diet Order             Diet heart healthy/carb modified Room service appropriate? Yes; Fluid consistency: Thin  Diet effective now                   DVT prophylaxis: rivaroxaban (XARELTO) tablet 20 mg Start: 05/25/20 2300 Code Status:   Code Status: Full Code  Family Communication: plan of care discussed with patient at bedside. Updated daughter POA. I discussed with patient's other daughter this morning at the bedside again.  Status is: Inpatient Remains inpatient appropriate because:Ongoing diagnostic testing needed not appropriate for outpatient work up, IV treatments appropriate due to intensity of illness or inability to take PO and Inpatient level of care appropriate due to severity of illness  Dispo: The patient is from: SNF              Anticipated d/c is to: SNF.  Daughter reluctant about sending him back to [facility], social worker notified.              Patient currently is not medically stable to d/c.   Difficult to place patient No  Unresulted Labs (From admission, onward)            Start      Ordered   05/27/20 1707  Vitamin B1  Add-on,   AD       Question:  Specimen collection method  Answer:  IV Team=IV Team collect   05/27/20 1706   05/27/20 0500  Basic metabolic panel  Daily,   R     Question:  Specimen collection method  Answer:  Unit=Unit collect   05/26/20 1436   05/27/20 0500  CBC with Differential/Platelet  Daily,   R     Question:  Specimen collection method  Answer:  Unit=Unit collect   05/26/20 1436   05/27/20 0500  Magnesium  Daily,   R     Question:  Specimen collection method  Answer:  Unit=Unit collect   05/26/20 1436          Medications reviewed:  Scheduled Meds:  Chlorhexidine Gluconate Cloth  6 each Topical Daily   [START ON 05/31/2020] doxycycline  100 mg Oral Q12H   furosemide  40 mg Intravenous Daily   ipratropium-albuterol  3 mL Nebulization BID   lisinopril  20 mg Oral QHS   magnesium oxide  400 mg Oral Daily   mouth rinse  15 mL Mouth Rinse BID   oxybutynin  5 mg Oral QHS   pantoprazole (PROTONIX)  IV  40 mg Intravenous Q24H   rivaroxaban  20 mg Oral QHS   sodium chloride flush  10-40 mL Intracatheter Q12H   sodium chloride flush  3 mL Intravenous Q12H   tamsulosin  0.4 mg Oral QPC breakfast   thiamine  100 mg Oral q AM   Continuous Infusions:  sodium chloride     potassium chloride 10 mEq (05/29/20 1109)   vancomycin Stopped (05/29/20 0200)    Consultants:see note  Procedures:see note  Antimicrobials: Anti-infectives (From admission, onward)    Start     Dose/Rate Route Frequency Ordered Stop   05/31/20 1000  doxycycline (VIBRA-TABS) tablet 100 mg        100 mg Oral Every 12 hours 05/28/20 1448     05/26/20 1500  metroNIDAZOLE (FLAGYL) IVPB 500 mg  Status:  Discontinued        500 mg 100 mL/hr over 60 Minutes Intravenous Every 8 hours 05/26/20 1413 05/27/20 0842   05/25/20 1930  ceFEPIme (MAXIPIME) 2 g in sodium chloride 0.9 % 100 mL IVPB  Status:  Discontinued        2 g 200 mL/hr over 30 Minutes Intravenous Every 8  hours 05/25/20 1310 05/28/20 0849   05/25/20 1430  vancomycin (VANCOREADY) IVPB 1000 mg/200 mL        1,000 mg 200 mL/hr over 60 Minutes Intravenous Every 12 hours 05/25/20 1309 05/31/20 2359   05/25/20 1115  ceFEPIme (MAXIPIME) 2 g in sodium chloride 0.9 % 100 mL IVPB        2 g 200 mL/hr over 30 Minutes Intravenous  Once 05/25/20 1106 05/25/20 1158      Culture/Microbiology    Component Value Date/Time   SDES BLOOD RIGHT HAND 05/25/2020 1056   SPECREQUEST  05/25/2020 1056    BOTTLES DRAWN AEROBIC AND ANAEROBIC Blood Culture results may not be optimal due to an inadequate volume of blood received in culture bottles   CULT  05/25/2020 1056    NO GROWTH 4 DAYS Performed at [facility], [address] [place], [place] [address]    REPTSTATUS PENDING 05/25/2020 1056    Other culture-see note  Objective: Vitals: Today's Vitals   05/29/20 0433 05/29/20 0700 05/29/20 0856 05/29/20 1103  BP: (!) 161/57   (!) 148/62  Pulse: 60  63 60  Resp: Temp: 98.9 F (37.2 C)   98.5 F (36.9 C)  TempSrc: Oral     SpO2: (!) 88%  92% 94%  Weight:      Height:      PainSc:  Asleep      Intake/Output Summary (Last 24 hours) at 05/29/2020 1136 Last data filed at 05/29/2020 0958 Gross per 24 hour  Intake 1349.22 ml  Output 2200 ml  Net -850.78 ml   Filed Weights   05/25/20 1018  Weight: 108.1 kg   Weight change:   Intake/Output from previous day: 04/07 0701 - 04/08 0700 In: 900 [P.O.:700; IV Piggyback:200] Out: 2750 [Urine:2750] Intake/Output this shift: Total I/O In: 569.2 [P.O.:360; Other:4; IV Piggyback:205.2] Out: -  Filed Weights   05/25/20 1018  Weight: 108.1 kg    Examination: General exam: AAOx1, obese,NAD, weak appearing. HEENT:Oral mucosa moist, Ear/Nose WNL grossly, dentition normal. Respiratory system: bilaterally diminishedd,no wheezing or crackles,no use of accessory muscle Cardiovascular system: S1 & S2 +, No JVD,. Gastrointestinal system:  Abdomen soft, NT,ND, BS+ Nervous System:Alert, awake, with dementia, moving all his extremities and grossly nonfocal Extremities: No edema,  distal peripheral pulses palpable.  Skin: No rashes,no icterus. MSK: Normal muscle bulk,tone, power Data Reviewed: I have personally reviewed following labs and imaging studies CBC: Recent Labs  Lab 05/25/20 1009 05/26/20 0340 05/27/20 0329 05/28/20 0419 05/29/20 0415  WBC 18.6* 13.7* 12.4* 12.5* 12.3*  NEUTROABS 16.3*  --  9.2* 9.5* 9.0*  HGB 11.8* 11.1* 10.1* 10.2* 10.7*  HCT 37.7* 35.0* 32.1* 32.1* 34.1*  MCV 91.7 92.1 90.9 91.7 91.4  PLT 250 207 178 199 226   Basic Metabolic Panel: Recent Labs  Lab 05/25/20 1009 05/26/20 0340 05/27/20 0329 05/28/20 0419 05/29/20 0415  NA 141 139 142 139 141  K 3.0* 3.5 3.0* 3.0* 3.2*  CL 106 110 112* 108 109  CO2 20* 20* 24 24 24   GLUCOSE 153* 116* 116* 114* 116*  BUN 14 16 20 18 17   CREATININE 1.01 1.00 0.95 0.93 0.89  CALCIUM 9.1 8.7* 8.7* 8.4* 8.6*  MG  --   --  1.7 1.6* 2.0   GFR: Estimated Creatinine Clearance: 76 mL/min (by C-G formula based on SCr of 0.89 mg/dL). Liver Function Tests: Recent Labs  Lab 05/25/20 1009  AST 25  ALT 17  ALKPHOS 78  BILITOT 1.8*  PROT 6.7  ALBUMIN 3.1*   No results for input(s): LIPASE, AMYLASE in the last 168 hours. Recent Labs  Lab 05/27/20 2222  AMMONIA 11   Coagulation Profile: No results for input(s): INR, PROTIME in the last 168 hours. Cardiac Enzymes: No results for input(s): CKTOTAL, CKMB, CKMBINDEX, TROPONINI in the last 168 hours. BNP (last 3 results) No results for input(s): PROBNP in the last 8760 hours. HbA1C: No results for input(s): HGBA1C in the last 72 hours. CBG: No results for input(s): GLUCAP in the last 168 hours. Lipid Profile: No results for input(s): CHOL, HDL, LDLCALC, TRIG, CHOLHDL, LDLDIRECT in the last 72 hours. Thyroid Function Tests: Recent Labs    05/27/20 2220  TSH 0.907   Anemia Panel: Recent Labs     05/27/20 2220  VITAMINB12 1,186*   Sepsis Labs: Recent Labs  Lab 05/25/20 1028 05/25/20 1228 05/26/20 1750 05/27/20 0329 05/28/20 0419  PROCALCITON  --   --  0.30 0.24 0.13  LATICACIDVEN 3.6* 3.1*  --   --   --     Recent Results (from the past 240 hour(s))  Culture, blood (routine x 2)     Status: None (Preliminary result)   Collection Time: 05/25/20 10:50 AM   Specimen: BLOOD RIGHT ARM  Result Value Ref Range Status   Specimen Description BLOOD RIGHT ARM  Final   Special Requests   Final    BOTTLES DRAWN AEROBIC AND ANAEROBIC Blood Culture results may not be optimal due to an inadequate volume of blood received in culture bottles   Culture   Final    NO GROWTH 4 DAYS Performed at [facility], [address], [place], [address] [place]    Report Status PENDING  Incomplete  Culture, blood (routine x 2)     Status: None (Preliminary result)   Collection Time: 05/25/20 10:56 AM   Specimen: BLOOD RIGHT HAND  Result Value Ref Range Status   Specimen Description BLOOD RIGHT HAND  Final   Special Requests   Final    BOTTLES DRAWN AEROBIC AND ANAEROBIC Blood Culture results may not be optimal due to an inadequate volume of blood received in culture bottles   Culture   Final    NO GROWTH 4 DAYS Performed at [facility], [address] [place], [place] [address]    Report Status PENDING  Incomplete  SARS CORONAVIRUS 2 (TAT 6-24 HRS) Nasopharyngeal Nasopharyngeal Swab     Status: None   Collection Time: 05/25/20  9:00 PM   Specimen: Nasopharyngeal Swab  Result Value Ref Range Status   SARS Coronavirus 2 NEGATIVE NEGATIVE Final    Comment: (NOTE) SARS-CoV-2 target nucleic acids are NOT DETECTED.  The SARS-CoV-2 RNA is generally detectable in upper and lower respiratory specimens during the acute phase of infection. Negative results do not preclude SARS-CoV-2 infection, do not rule out co-infections with other pathogens, and should not be used as the sole  basis for treatment or other patient management decisions. Negative results must be combined with clinical observations, patient history, and epidemiological information. The expected result is Negative.  Fact Sheet for Patients: HairSlick.nohttps://www.fda.gov/media/138098/download  Fact Sheet for Healthcare Providers: quierodirigir.comhttps://www.fda.gov/media/138095/download  This test is not yet approved or cleared by the [place] FDA and  has been authorized for detection and/or diagnosis of SARS-CoV-2 by FDA under an Emergency Use Authorization (EUA). This EUA will remain  in effect (meaning this test can be used) for the duration of the COVID-19 declaration under Se ction 564(b)(1) of the Act, 21 U.S.C. section 360bbb-3(b)(1), unless the authorization is terminated or revoked sooner.  Performed at [facility], [address] [place], [place] [address]   Respiratory (~20 pathogens) panel by PCR     Status: None   Collection Time: 05/26/20  8:54 AM   Specimen: Nasopharyngeal Swab; Respiratory  Result Value Ref Range Status   Adenovirus NOT DETECTED NOT DETECTED Final   Coronavirus 229E NOT DETECTED NOT DETECTED Final    Comment: (NOTE) The Coronavirus on the Respiratory Panel, DOES NOT test for the novel  Coronavirus (2019 nCoV)    Coronavirus HKU1 NOT DETECTED NOT DETECTED Final   Coronavirus NL63 NOT DETECTED NOT DETECTED Final   Coronavirus OC43 NOT DETECTED NOT DETECTED Final   Metapneumovirus NOT DETECTED NOT DETECTED Final   Rhinovirus / Enterovirus NOT DETECTED NOT DETECTED Final   Influenza A NOT DETECTED NOT DETECTED Final   Influenza B NOT DETECTED NOT DETECTED Final   Parainfluenza Virus 1 NOT DETECTED NOT DETECTED Final   Parainfluenza Virus 2 NOT DETECTED NOT DETECTED Final   Parainfluenza Virus 3 NOT DETECTED NOT DETECTED Final   Parainfluenza Virus 4 NOT DETECTED NOT DETECTED Final   Respiratory Syncytial Virus NOT DETECTED NOT DETECTED Final   Bordetella pertussis NOT  DETECTED NOT DETECTED Final   Bordetella Parapertussis NOT DETECTED NOT DETECTED Final   Chlamydophila pneumoniae NOT DETECTED NOT DETECTED Final   Mycoplasma pneumoniae NOT DETECTED NOT DETECTED Final    Comment: Performed at [facility], [address] [place], [place] [address]     Radiology Studies: DG Chest Port 1 View  Result Date: 05/27/2020 CLINICAL DATA:  Progressive shortness of breath EXAM: PORTABLE CHEST 1 VIEW COMPARISON:  05/25/2020 FINDINGS: Right PICC line and left pacer remain in place, unchanged. Cardiomegaly, vascular congestion. Bibasilar atelectasis. Findings similar to prior study. IMPRESSION: Cardiomegaly with vascular congestion. Bibasilar atelectasis. Electronically Signed   By: Charlett NoseKevin  Dover M.D.   On: 05/27/2020 18:00     LOS: 4 days   Lanae Boastamesh Seerat Peaden, MD [facility]  05/29/2020, 11:36 AM

## 2020-05-30 DIAGNOSIS — Z515 Encounter for palliative care: Secondary | ICD-10-CM | POA: Diagnosis not present

## 2020-05-30 DIAGNOSIS — R061 Stridor: Secondary | ICD-10-CM | POA: Diagnosis not present

## 2020-05-30 DIAGNOSIS — Z7189 Other specified counseling: Secondary | ICD-10-CM | POA: Diagnosis not present

## 2020-05-30 LAB — CBC WITH DIFFERENTIAL/PLATELET
Abs Immature Granulocytes: 0.05 10*3/uL (ref 0.00–0.07)
Basophils Absolute: 0.1 10*3/uL (ref 0.0–0.1)
Basophils Relative: 1 %
Eosinophils Absolute: 0.9 10*3/uL — ABNORMAL HIGH (ref 0.0–0.5)
Eosinophils Relative: 8 %
HCT: 33.8 % — ABNORMAL LOW (ref 39.0–52.0)
Hemoglobin: 10.6 g/dL — ABNORMAL LOW (ref 13.0–17.0)
Immature Granulocytes: 1 %
Lymphocytes Relative: 9 %
Lymphs Abs: 1 10*3/uL (ref 0.7–4.0)
MCH: 28.9 pg (ref 26.0–34.0)
MCHC: 31.4 g/dL (ref 30.0–36.0)
MCV: 92.1 fL (ref 80.0–100.0)
Monocytes Absolute: 1.2 10*3/uL — ABNORMAL HIGH (ref 0.1–1.0)
Monocytes Relative: 11 %
Neutro Abs: 7.5 10*3/uL (ref 1.7–7.7)
Neutrophils Relative %: 70 %
Platelets: 215 10*3/uL (ref 150–400)
RBC: 3.67 MIL/uL — ABNORMAL LOW (ref 4.22–5.81)
RDW: 14.2 % (ref 11.5–15.5)
WBC: 10.7 10*3/uL — ABNORMAL HIGH (ref 4.0–10.5)
nRBC: 0 % (ref 0.0–0.2)

## 2020-05-30 LAB — BASIC METABOLIC PANEL
Anion gap: 5 (ref 5–15)
BUN: 16 mg/dL (ref 8–23)
CO2: 26 mmol/L (ref 22–32)
Calcium: 8.5 mg/dL — ABNORMAL LOW (ref 8.9–10.3)
Chloride: 109 mmol/L (ref 98–111)
Creatinine, Ser: 0.78 mg/dL (ref 0.61–1.24)
GFR, Estimated: >60 mL/min (ref >60)
Glucose, Bld: 113 mg/dL — ABNORMAL HIGH (ref 70–99)
Potassium: 3.2 mmol/L — ABNORMAL LOW (ref 3.5–5.1)
Sodium: 140 mmol/L (ref 135–145)

## 2020-05-30 LAB — CULTURE, BLOOD (ROUTINE X 2)
Culture: NO GROWTH
Culture: NO GROWTH

## 2020-05-30 LAB — MAGNESIUM: Magnesium: 1.8 mg/dL (ref 1.7–2.4)

## 2020-05-30 MED ORDER — PANTOPRAZOLE SODIUM 40 MG PO TBEC
40.0000 mg | DELAYED_RELEASE_TABLET | Freq: Every day | ORAL | Status: DC
  Administered 2020-05-30 – 2020-06-04 (×6): 40 mg via ORAL
  Filled 2020-05-30 (×6): qty 1

## 2020-05-30 MED ORDER — IPRATROPIUM-ALBUTEROL 0.5-2.5 (3) MG/3ML IN SOLN: 3.0000 mL | Freq: Four times a day (QID) | RESPIRATORY_TRACT | Status: DC | PRN

## 2020-05-30 MED ORDER — POTASSIUM CHLORIDE 10 MEQ/100ML IV SOLN
10.0000 meq | INTRAVENOUS | Status: AC
  Administered 2020-05-30 (×2): 10 meq via INTRAVENOUS
  Filled 2020-05-30 (×2): qty 100

## 2020-05-30 MED ORDER — POTASSIUM CHLORIDE 20 MEQ PO PACK
40.0000 meq | PACK | Freq: Once | ORAL | Status: AC
  Administered 2020-05-30: 40 meq via ORAL
  Filled 2020-05-30: qty 2

## 2020-05-30 NOTE — Progress Notes (Signed)
Pt refused breakfast at this time.   Leonia Reeves, RN, BSN

## 2020-05-30 NOTE — Progress Notes (Signed)
Pt's daughter at the bedside- she encouraged pt to eat and he was able to eat about 50% of meal. Daughter and NP got pt up to the chair. Pt is off O2 and O2 sat.on RA is 93%.    Leonia Reeves, RN, BSN

## 2020-05-30 NOTE — Progress Notes (Signed)
2L O2 via [place] applied- O2 sat. Dropped in 80's.   Leonia Reeves, RN

## 2020-05-30 NOTE — Progress Notes (Addendum)
PROGRESS NOTE    Adam PorchMoses M Ast  ZOX:[id number] DOB: Sep 30, 1933 DOA: 05/25/2020 PCP: Patient, No Pcp Per (Inactive)   Chief Complaint  Patient presents with  . Respiratory Distress  Brief Narrative:  87 year old male with dementia, hypertension, BPH, generalized muscle weakness/deconditioning, PAF on Xarelto, hypertension, hyperlipidemia, morbid obesity, brought [facility] the ED [facility] on 4/4 for respiratory distress.  Recently admitted 2/24-3/4 MRSA bacteremia and was seen by ID, EP, he is a poor surgical candidate and EP did not recommend removal of pacemaker and recommended oral suppressive therapy, ID advised 6 weeks of vancomycin followed by oral suppressive therapy with doxycycline lifelong.  He was [facility] facility. In the ED patient was febrile 101.3, with leukocytosis, lactic acidosis chest x-ray with mild bibasilar subsegmental atelectasis or possible pulmonary edema, started on broad-spectrum antibiotics vancomycin cefepime Flagyl due to concern for pneumonia. Given stridor distress patient was consulted by ENT and pulmonary CCM team-?  Stridor likely from tracheobronchial narrowing.  He has been confused and more lethargic-ABG and lab work TSH ammonia level and were unremarkable. 4/7 was more alert/mildly agitated.  Subjective: Seen this am Patient is alert awake able to tell me his name. WBC count improving, no fever. Blood culture negative from 4/4. k still low. Daughter came to bedside later and came and updated she thinks he is close to baseline  Assessme.nt & Plan:  Stridor/acute respiratory distress and failure with hypoxia/Tracheobronchomalacia:CT chest and soft tissue neck no abnormality in the neck or subglottis, ENT failed no impending airway compromise,ENT was not able to evaluate the true vocal cords due to patient's lack of cooperation and unwillingness to consent.Was seen by pulmonary-felt that it is related to  tracheobronchomalacia upper airway which could be function of aging and dementia related to loss of muscle tone and likely acute on chronic diastolic dysfunction as evidenced by pleural effusion advised steroids for 7 to 12 days twice daily diuresis.  Continue on IV Lasix, not needing steroid.  Continue supplement oxygen as tolerated, bronchodilators.  PT OT.   MRSA bacteremia on last admission 2/24-3/4 in the setting of PM/PM in place-not a candidate for PM removal as per cardiology:Patient has been on vancomycin and will complete 6 weeks therapy on 05/31/2020 following which he will need oral doxycycline lifelong.blood culture negative from 4/4.  Off cefepime.  Leukocytosis downtrending.  He has been afebrile.    Hypokalemia: Being repleted p.o. IV this morning.   Hypomagnesemia: Replaced at 2.0 Recent Labs  Lab 05/26/20 0340 05/27/20 0329 05/28/20 0419 05/29/20 0415 05/30/20 0222  K 3.5 3.0* 3.0* 3.2* 3.2*   Anemia likely from chronic disease.  No signs of acute bleeding.  Stable hemoglobin Recent Labs  Lab 05/26/20 0340 05/27/20 0329 05/28/20 0419 05/29/20 0415 05/30/20 0222  HGB 11.1* 10.1* 10.2* 10.7* 10.6*  HCT 35.0* 32.1* 32.1* 34.1* 33.8*   Pleural effusion bilateral, suspecting diastolic CHF, acute:recent echo 2/24 EF 60 to 65%, off HCTZ stopped and placed on IV Lasix.  Repeat chest x-ray showed congestion 05/27/20.  Continue on IV Lasix-will keep for another day and switch to p.o.  Monitor intake output Daily weight as below.  Monitor electrolytes and replete.  Filed Weights   05/25/20 1018  Weight: 108.1 kg  Net IO Since Admission: 1,922.21 mL [05/30/20 1007] Recent Labs  Lab 05/26/20 0340 05/27/20 0329 05/28/20 0419 05/29/20 0415 05/30/20 0222  BUN 16 20 18 17 16   CREATININE 1.00 0.95 0.93 0.89 0.78   Acute encephalopathy  suspect metabolic in the background of dementia Dementia: Patient has been lethargic for few days but appears to be more alert awake.  He does  appear to be sleeping in the daytime and awaking at night likely contributing to his sleepiness lethargy in the daytime.  He is oriented x1.  His ABG shows no CO2 retention, TSH/ammonia l level/B12 normal, B1 pending.He has baseline dementia oriented to self. Continue fall precaution supportive care delirium precaution less alert awake.  Appreciate speech therapy on board   Essential hypertension:Fairly controlled, continue lisinopril lasix  PAF on Eliquis.  XNT:ZGYFVCBS Flomax oxybutynin   History of alcohol dependence no signs symptoms symptoms of withdrawal.  He has been hospitalized recently and has been living in a skilled nursing facility since then.  Morbid obesity BMI 35: Will benefit with PCP follow-up and weight loss   WHQ:PRFFMBW is elderly debilitated multiple comorbidities remains full code.Overall prognosis remains guarded with  frequent hospitalization.I have discussed with patient's POA over the phone, prognosis remains to be seen, palliative care has been consulted, continue on current full scope of treatment.  Appreciate palliative care meeting today.  Diet Order            Diet heart healthy/carb modified Room service appropriate? Yes; Fluid consistency: Thin  Diet effective now                 DVT prophylaxis: rivaroxaban (XARELTO) tablet 20 mg Start: 05/25/20 2300 Code Status:   Code Status: Full Code  Family Communication: plan of care discussed with patient at bedside. Updated daughter POA previously. Updated patient's POA at the bedside today, palliative meeting at bedside today.  Status is: Inpatient Remains inpatient appropriate because:Ongoing diagnostic testing needed not appropriate for outpatient work up, IV treatments appropriate due to intensity of illness or inability to take PO and Inpatient level of care appropriate due to severity of illness  Dispo: The patient is from: SNF              Anticipated d/c is to: SNF.Daughter reluctant about sending him  back to [facility], social worker notified.              Patient currently is not medically stable to d/c.   Difficult to place patient No  Unresulted Labs (From admission, onward)          Start     Ordered   05/27/20 1707  Vitamin B1  Add-on,   AD       Question:  Specimen collection method  Answer:  IV Team=IV Team collect   05/27/20 1706   05/27/20 0500  Basic metabolic panel  Daily,   R     Question:  Specimen collection method  Answer:  Unit=Unit collect   05/26/20 1436   05/27/20 0500  CBC with Differential/Platelet  Daily,   R     Question:  Specimen collection method  Answer:  Unit=Unit collect   05/26/20 1436   05/27/20 0500  Magnesium  Daily,   R     Question:  Specimen collection method  Answer:  Unit=Unit collect   05/26/20 1436        Medications reviewed:  Scheduled Meds: . Chlorhexidine Gluconate Cloth  6 each Topical Daily  . [START ON 06/01/2020] doxycycline  100 mg Oral Q12H  . furosemide  40 mg Intravenous Daily  . ipratropium-albuterol  3 mL Nebulization BID  . lisinopril  20 mg Oral QHS  . magnesium oxide  400 mg Oral Daily  .  mouth rinse  15 mL Mouth Rinse BID  . melatonin  3 mg Oral QHS  . oxybutynin  5 mg Oral QHS  . pantoprazole  40 mg Oral QHS  . rivaroxaban  20 mg Oral QHS  . sodium chloride flush  10-40 mL Intracatheter Q12H  . sodium chloride flush  3 mL Intravenous Q12H  . tamsulosin  0.4 mg Oral QPC breakfast  . thiamine  100 mg Oral q AM   Continuous Infusions: . sodium chloride    . vancomycin 1,000 mg (05/30/20 0049)    Consultants:see note  Procedures:see note  Antimicrobials: Anti-infectives (From admission, onward)   Start     Dose/Rate Route Frequency Ordered Stop   06/01/20 1000  doxycycline (VIBRA-TABS) tablet 100 mg        100 mg Oral Every 12 hours 05/29/20 1317     05/31/20 1000  doxycycline (VIBRA-TABS) tablet 100 mg  Status:  Discontinued        100 mg Oral Every 12 hours 05/28/20 1448 05/29/20 1317   05/26/20 1500   metroNIDAZOLE (FLAGYL) IVPB 500 mg  Status:  Discontinued        500 mg 100 mL/hr over 60 Minutes Intravenous Every 8 hours 05/26/20 1413 05/27/20 0842   05/25/20 1930  ceFEPIme (MAXIPIME) 2 g in sodium chloride 0.9 % 100 mL IVPB  Status:  Discontinued        2 g 200 mL/hr over 30 Minutes Intravenous Every 8 hours 05/25/20 1310 05/28/20 0849   05/25/20 1430  vancomycin (VANCOREADY) IVPB 1000 mg/200 mL        1,000 mg 200 mL/hr over 60 Minutes Intravenous Every 12 hours 05/25/20 1309 05/31/20 2359   05/25/20 1115  ceFEPIme (MAXIPIME) 2 g in sodium chloride 0.9 % 100 mL IVPB        2 g 200 mL/hr over 30 Minutes Intravenous  Once 05/25/20 1106 05/25/20 1158     Culture/Microbiology    Component Value Date/Time   SDES BLOOD RIGHT HAND 05/25/2020 1056   SPECREQUEST  05/25/2020 1056    BOTTLES DRAWN AEROBIC AND ANAEROBIC Blood Culture results may not be optimal due to an inadequate volume of blood received in culture bottles   CULT  05/25/2020 1056    NO GROWTH 4 DAYS Performed at [facility], [address], [place], [place] [address]    REPTSTATUS PENDING 05/25/2020 1056    Other culture-see note  Objective: Vitals: Today's Vitals   05/29/20 2050 05/30/20 0407 05/30/20 0830 05/30/20 0922  BP:  (!) 154/75  (!) 167/78  Pulse:  61  62  Resp:  17  18  Temp:  97.9 F (36.6 C)  (!) 97.5 F (36.4 C)  TempSrc:    Oral  SpO2: 100% 98% 97% 98%  Weight:      Height:      PainSc:        Intake/Output Summary (Last 24 hours) at 05/30/2020 1007 Last data filed at 05/30/2020 0900 Gross per 24 hour  Intake 500 ml  Output 100 ml  Net 400 ml   Filed Weights   05/25/20 1018  Weight: 108.1 kg   Weight change:   Intake/Output from previous day: 04/08 0701 - 04/09 0700 In: 1069.2 [P.O.:460; IV Piggyback:605.2] Out: 500 [Urine:500] Intake/Output this shift: No intake/output data recorded. Filed Weights   05/25/20 1018  Weight: 108.1 kg    Examination: General exam:  AAOx1,obses, on 2l [place], NAD, weak appearing. HEENT:Oral mucosa moist, Ear/Nose  WNL grossly, dentition normal. Respiratory system: bilaterally clear,no wheezing or crackles,no use of accessory muscle Cardiovascular system: S1 & S2 +, No JVD,. Gastrointestinal system: Abdomen soft, NT,ND, BS+ Nervous System:Alert, awake, moving extremities and grossly nonfocal Extremities: No edema, distal peripheral pulses palpable.  Skin: No rashes,no icterus. MSK: Normal muscle bulk,tone, power  Data Reviewed: I have personally reviewed following labs and imaging studies CBC: Recent Labs  Lab 05/25/20 1009 05/26/20 0340 05/27/20 0329 05/28/20 0419 05/29/20 0415 05/30/20 0222  WBC 18.6* 13.7* 12.4* 12.5* 12.3* 10.7*  NEUTROABS 16.3*  --  9.2* 9.5* 9.0* 7.5  HGB 11.8* 11.1* 10.1* 10.2* 10.7* 10.6*  HCT 37.7* 35.0* 32.1* 32.1* 34.1* 33.8*  MCV 91.7 92.1 90.9 91.7 91.4 92.1  PLT 250 207 178 199 226 215   Basic Metabolic Panel: Recent Labs  Lab 05/26/20 0340 05/27/20 0329 05/28/20 0419 05/29/20 0415 05/30/20 0222  NA 139 142 139 141 140  K 3.5 3.0* 3.0* 3.2* 3.2*  CL 110 112* 108 109 109  CO2 20* 24 24 24 26   GLUCOSE 116* 116* 114* 116* 113*  BUN 16 20 18 17 16   CREATININE 1.00 0.95 0.93 0.89 0.78  CALCIUM 8.7* 8.7* 8.4* 8.6* 8.5*  MG  --  1.7 1.6* 2.0 1.8   GFR: Estimated Creatinine Clearance: 84.6 mL/min (by C-G formula based on SCr of 0.78 mg/dL). Liver Function Tests: Recent Labs  Lab 05/25/20 1009  AST 25  ALT 17  ALKPHOS 78  BILITOT 1.8*  PROT 6.7  ALBUMIN 3.1*   No results for input(s): LIPASE, AMYLASE in the last 168 hours. Recent Labs  Lab 05/27/20 2222  AMMONIA 11   Coagulation Profile: No results for input(s): INR, PROTIME in the last 168 hours. Cardiac Enzymes: No results for input(s): CKTOTAL, CKMB, CKMBINDEX, TROPONINI in the last 168 hours. BNP (last 3 results) No results for input(s): PROBNP in the last 8760 hours. HbA1C: No results for input(s):  HGBA1C in the last 72 hours. CBG: No results for input(s): GLUCAP in the last 168 hours. Lipid Profile: No results for input(s): CHOL, HDL, LDLCALC, TRIG, CHOLHDL, LDLDIRECT in the last 72 hours. Thyroid Function Tests: Recent Labs    05/27/20 2220  TSH 0.907   Anemia Panel: Recent Labs    05/27/20 2220  VITAMINB12 1,186*   Sepsis Labs: Recent Labs  Lab 05/25/20 1028 05/25/20 1228 05/26/20 1750 05/27/20 0329 05/28/20 0419  PROCALCITON  --   --  0.30 0.24 0.13  LATICACIDVEN 3.6* 3.1*  --   --   --     Recent Results (from the past 240 hour(s))  Culture, blood (routine x 2)     Status: None (Preliminary result)   Collection Time: 05/25/20 10:50 AM   Specimen: BLOOD RIGHT ARM  Result Value Ref Range Status   Specimen Description BLOOD RIGHT ARM  Final   Special Requests   Final    BOTTLES DRAWN AEROBIC AND ANAEROBIC Blood Culture results may not be optimal due to an inadequate volume of blood received in culture bottles   Culture   Final    NO GROWTH 4 DAYS Performed at [facility], [address], [place], [address] [place]    Report Status PENDING  Incomplete  Culture, blood (routine x 2)     Status: None (Preliminary result)   Collection Time: 05/25/20 10:56 AM   Specimen: BLOOD RIGHT HAND  Result Value Ref Range Status   Specimen Description BLOOD RIGHT HAND  Final   Special  Requests   Final    BOTTLES DRAWN AEROBIC AND ANAEROBIC Blood Culture results may not be optimal due to an inadequate volume of blood received in culture bottles   Culture   Final    NO GROWTH 4 DAYS Performed at [facility], [address] [place], [place] [address]    Report Status PENDING  Incomplete  SARS CORONAVIRUS 2 (TAT 6-24 HRS) Nasopharyngeal Nasopharyngeal Swab     Status: None   Collection Time: 05/25/20  9:00 PM   Specimen: Nasopharyngeal Swab  Result Value Ref Range Status   SARS Coronavirus 2 NEGATIVE NEGATIVE Final    Comment: (NOTE) SARS-CoV-2 target  nucleic acids are NOT DETECTED.  The SARS-CoV-2 RNA is generally detectable in upper and lower respiratory specimens during the acute phase of infection. Negative results do not preclude SARS-CoV-2 infection, do not rule out co-infections with other pathogens, and should not be used as the sole basis for treatment or other patient management decisions. Negative results must be combined with clinical observations, patient history, and epidemiological information. The expected result is Negative.  Fact Sheet for Patients: HairSlick.no  Fact Sheet for Healthcare Providers: quierodirigir.com  This test is not yet approved or cleared by the [place] FDA and  has been authorized for detection and/or diagnosis of SARS-CoV-2 by FDA under an Emergency Use Authorization (EUA). This EUA will remain  in effect (meaning this test can be used) for the duration of the COVID-19 declaration under Se ction 564(b)(1) of the Act, 21 U.S.C. section 360bbb-3(b)(1), unless the authorization is terminated or revoked sooner.  Performed at [facility], [address] [place], [place] [address]   Respiratory (~20 pathogens) panel by PCR     Status: None   Collection Time: 05/26/20  8:54 AM   Specimen: Nasopharyngeal Swab; Respiratory  Result Value Ref Range Status   Adenovirus NOT DETECTED NOT DETECTED Final   Coronavirus 229E NOT DETECTED NOT DETECTED Final    Comment: (NOTE) The Coronavirus on the Respiratory Panel, DOES NOT test for the novel  Coronavirus (2019 nCoV)    Coronavirus HKU1 NOT DETECTED NOT DETECTED Final   Coronavirus NL63 NOT DETECTED NOT DETECTED Final   Coronavirus OC43 NOT DETECTED NOT DETECTED Final   Metapneumovirus NOT DETECTED NOT DETECTED Final   Rhinovirus / Enterovirus NOT DETECTED NOT DETECTED Final   Influenza A NOT DETECTED NOT DETECTED Final   Influenza B NOT DETECTED NOT DETECTED Final   Parainfluenza  Virus 1 NOT DETECTED NOT DETECTED Final   Parainfluenza Virus 2 NOT DETECTED NOT DETECTED Final   Parainfluenza Virus 3 NOT DETECTED NOT DETECTED Final   Parainfluenza Virus 4 NOT DETECTED NOT DETECTED Final   Respiratory Syncytial Virus NOT DETECTED NOT DETECTED Final   Bordetella pertussis NOT DETECTED NOT DETECTED Final   Bordetella Parapertussis NOT DETECTED NOT DETECTED Final   Chlamydophila pneumoniae NOT DETECTED NOT DETECTED Final   Mycoplasma pneumoniae NOT DETECTED NOT DETECTED Final    Comment: Performed at [facility] Lab, [address] [place], [place] [address]     Radiology Studies: No results found.   LOS: 5 days   Lanae Boast, MD [facility]  05/30/2020, 10:07 AM

## 2020-05-30 NOTE — Progress Notes (Signed)
Palliative Medicine Inpatient Follow Up Note  Reason for consult:  Goals of Care  HPI:  Per intake H&P --> Adam Conner an 87 y.o.malewith PMH significant for recent discharge last month after treatment of MRSA bacteremiasecondary to OM,presently on IV vancomycin, DM2, HTN, atrial fibrillation, dementia, diabetic foot ulcer who was brought to the ED by EMS when they were called for stridor. Patient is unable to provide a history due to his dementia. Per ED documentation, patient was found to be stridorous by the nursing home and when EMS got there he needed to be placed on CPAP with need for racemic epi and IM epi as well as DuoNeb. O2 sats were apparently in the mid 60s although accuracy was unclear per EMS report. By the time the patient came to the ED, he was breathing much more comfortably and was able to be taken off of CPAP. He is presently comfortable on 2 L nasal cannula.  Palliative care was asked to get involved to further address goals of care.  Today's Discussion (05/30/2020):  *Please note that this is a verbal dictation therefore any spelling or grammatical errors are due to the "[place]" system interpretation.  I met this morning at bedside with Adam Conner and his daughter, Adam Conner. Adam Conner was receiving a medical update at that time from Dr. Lupita Leash. After Dr. Lupita Leash left the room, I introduced Palliative Medicine as specialized medical care for people living with serious illness. It focuses on providing relief from the symptoms and stress of a serious illness. The goal is to improve quality of life for both the patient and the family.  Adam Conner and I discuss Adam Conner comorbidities and his decline over the last month. We review that older patients have a hard time "bouncing back". She expresses that she knows and understands this. We review that up until a few weeks ago Adam Conner was very mobile and was still going to the liquor store with a cane on his own. His daughter shares that  she realizes he may be encroaching the end of his life though is interested to see if he can make strides at skilled nursing. She does not want Adam Conner to go back to [facility].   Adam Conner and I were able to get Adam Conner to chair, he was a two person assist. We reviewed that he will have a long way to go in rehabilitation to regain strength and that he will likely be at a new baseline. Reviewed that if he continues to decline hospice is a very appropriate consideration.   Further, Adam Conner and I reviewed code status. She shares that the [place] had advance directives for Adam Conner though they lost them. We reviewed that we can complete a MOST form. Provided information on this. Provided "Hard Choices for [employer]" booklet. I shared that it would be most beneficial if we could complete this prior to discharge. I reviewed that in cases like with Adam Conner there is much concern that we may cause his body more burden than benefit with CPR. His daughter shares that he would not want to be attached to machines for the rest of his life. I told her to take the evening to review the material and discuss it with her other siblings.   We agreed to plan to meet again tomorrow for further conversations.   Questions and concerns addressed   Objective Assessment: Vital Signs Vitals:   05/30/20 0922 05/30/20 1112  BP: (!) 167/78   Pulse: 62   Resp:  18   Temp: (!) 97.5 F (36.4 C)   SpO2: 98% 93%    Intake/Output Summary (Last 24 hours) at 05/30/2020 1242 Last data filed at 05/30/2020 1028 Gross per 24 hour  Intake 500 ml  Output 1200 ml  Net -700 ml   Last Weight  Most recent update: 05/25/2020 10:19 AM   Weight  108.1 kg (238 lb 5.1 oz)           Gen:  Elderly M in NAD HEENT: moist mucous membranes CV: Regular rate and rhythm  PULM: 2LPM [place] ABD: soft/nontender  EXT: No edema  Neuro: Alert and oriented x1  SUMMARY OF RECOMMENDATIONS Full Code - Patients POA is coming to town and we will further discuss  this throughout the weekend. Recommended DNR considerations. Has a presently MOST with limitations on intubation.   Patients daughter, Adam Conner plans to review MOST tonight for potential completion tomorrow  Goal: short term SNF stay  TOC - OP Palliative care and to provide family information on home hospice options  Ongoing conversations in the oncoming days  Time Spent: 60 Greater than 50% of the time was spent in counseling and coordination of care ______________________________________________________________________________________ [place] Team Team Cell Phone: [phone] Please utilize secure chat with additional questions, if there is no response within 30 minutes please call the above phone number  Palliative Medicine Team providers are available by phone from 7am to 7pm daily and can be reached through the team cell phone.  Should this patient require assistance outside of these hours, please call the patient's attending physician.

## 2020-05-31 DIAGNOSIS — R061 Stridor: Secondary | ICD-10-CM | POA: Diagnosis not present

## 2020-05-31 DIAGNOSIS — Z515 Encounter for palliative care: Secondary | ICD-10-CM | POA: Diagnosis not present

## 2020-05-31 LAB — CBC WITH DIFFERENTIAL/PLATELET
Abs Immature Granulocytes: 0.07 10*3/uL (ref 0.00–0.07)
Basophils Absolute: 0.1 10*3/uL (ref 0.0–0.1)
Basophils Relative: 1 %
Eosinophils Absolute: 0.8 10*3/uL — ABNORMAL HIGH (ref 0.0–0.5)
Eosinophils Relative: 7 %
HCT: 34.6 % — ABNORMAL LOW (ref 39.0–52.0)
Hemoglobin: 10.9 g/dL — ABNORMAL LOW (ref 13.0–17.0)
Immature Granulocytes: 1 %
Lymphocytes Relative: 9 %
Lymphs Abs: 1 10*3/uL (ref 0.7–4.0)
MCH: 29.3 pg (ref 26.0–34.0)
MCHC: 31.5 g/dL (ref 30.0–36.0)
MCV: 93 fL (ref 80.0–100.0)
Monocytes Absolute: 1.2 10*3/uL — ABNORMAL HIGH (ref 0.1–1.0)
Monocytes Relative: 10 %
Neutro Abs: 8.5 10*3/uL — ABNORMAL HIGH (ref 1.7–7.7)
Neutrophils Relative %: 72 %
Platelets: 238 10*3/uL (ref 150–400)
RBC: 3.72 MIL/uL — ABNORMAL LOW (ref 4.22–5.81)
RDW: 14.1 % (ref 11.5–15.5)
WBC: 11.7 10*3/uL — ABNORMAL HIGH (ref 4.0–10.5)
nRBC: 0 % (ref 0.0–0.2)

## 2020-05-31 LAB — BASIC METABOLIC PANEL
Anion gap: 5 (ref 5–15)
BUN: 16 mg/dL (ref 8–23)
CO2: 28 mmol/L (ref 22–32)
Calcium: 8.8 mg/dL — ABNORMAL LOW (ref 8.9–10.3)
Chloride: 109 mmol/L (ref 98–111)
Creatinine, Ser: 0.82 mg/dL (ref 0.61–1.24)
GFR, Estimated: >60 mL/min (ref >60)
Glucose, Bld: 113 mg/dL — ABNORMAL HIGH (ref 70–99)
Potassium: 3.5 mmol/L (ref 3.5–5.1)
Sodium: 142 mmol/L (ref 135–145)

## 2020-05-31 LAB — MAGNESIUM: Magnesium: 1.9 mg/dL (ref 1.7–2.4)

## 2020-05-31 MED ORDER — FUROSEMIDE 20 MG PO TABS
20.0000 mg | ORAL_TABLET | Freq: Every day | ORAL | Status: DC
  Administered 2020-05-31 – 2020-06-05 (×6): 20 mg via ORAL
  Filled 2020-05-31 (×7): qty 1

## 2020-05-31 NOTE — Plan of Care (Signed)

## 2020-05-31 NOTE — Progress Notes (Signed)
Pharmacy Antibiotic Note  Adam Conner is a 87 y.o. male admitted on 05/25/2020 with MRSA bacteremia.  Pharmacy has been consulted for vancomycin dosing. H/O MRSA bacteremia in the setting of PMK. PMK in place - not a candidate for PMK removal per Cardiology. Original planned course through today - 4/10 with suppressive therapy with doxycycline thereafter. These orders have been placed. SCr remains < 1.    Plan: Continue Vancomycin 1000 mg IV every 12 hours through 05/31/20. Doxycycline to start 4/11  Pharmacy will sign-off Vanc given end of therapy today     Height: 5\' 10"  (177.8 cm) Weight: 108.1 kg (238 lb 5.1 oz) IBW/kg (Calculated) : 73  Temp (24hrs), Avg:97.7 F (36.5 C), Min:97.4 F (36.3 C), Max:98 F (36.7 C)  Recent Labs  Lab 05/25/20 1028 05/25/20 1228 05/25/20 1309 05/26/20 0340 05/27/20 0329 05/27/20 1354 05/28/20 0419 05/29/20 0415 05/30/20 0222 05/31/20 0309  WBC  --   --   --    < > 12.4*  --  12.5* 12.3* 10.7* 11.7*  CREATININE  --   --   --    < > 0.95  --  0.93 0.89 0.78 0.82  LATICACIDVEN 3.6* 3.1*  --   --   --   --   --   --   --   --   VANCOTROUGH  --   --   --   --   --  18  --   --   --   --   VANCORANDOM  --   --  17  --   --   --   --   --   --   --    < > = values in this interval not displayed.    Estimated Creatinine Clearance: 82.5 mL/min (by C-G formula based on SCr of 0.82 mg/dL).    No Known Allergies  Antimicrobials this admission: 2/25 vancomycin >> 4/10 4/4 Cefepime >> 4/7 4/5 Flagyl >>4/6   Dose adjustments this admission:   Microbiology results: 4/4 Bcx: ng final  4/4 RVP: negative 4/4 Cov: negative    6/4, PharmD, [place], BCIDP Infectious Diseases Clinical Pharmacist Phone: [phone] 05/31/2020 7:50 AM  Please check [email or web address] for unit-specific pharmacy phone numbers.

## 2020-05-31 NOTE — Progress Notes (Addendum)
Palliative Medicine Inpatient Follow Up Note  Reason for consult:  Goals of Care  HPI:  Per intake H&P --> Adam Conner an 87 y.o.malewith PMH significant for recent discharge last month after treatment of MRSA bacteremiasecondary to OM,presently on IV vancomycin, DM2, HTN, atrial fibrillation, dementia, diabetic foot ulcer who was brought to the ED by EMS when they were called for stridor. Patient is unable to provide a history due to his dementia. Per ED documentation, patient was found to be stridorous by the nursing home and when EMS got there he needed to be placed on CPAP with need for racemic epi and IM epi as well as DuoNeb. O2 sats were apparently in the mid 60s although accuracy was unclear per EMS report. By the time the patient came to the ED, he was breathing much more comfortably and was able to be taken off of CPAP. He is presently comfortable on 2 L nasal cannula.  Palliative care was asked to get involved to further address goals of care.  Today's Discussion (05/31/2020):  *Please note that this is a verbal dictation therefore any spelling or grammatical errors are due to the "Berthold One" system interpretation.  Chart reviewed.  I spoke to patients bedside RN, Adam Conner.  She shares that Adam Conner is sleepy this morning though he was pleasant and able to eat almost all of his breakfast.  Encourage Adam Conner to get Adam Conner out of bed as this tends to wake him up and is better for his delirium.  Met at bedside this morning with Adam Conner.  He was noted to be relatively sleepy though arousable.  There was no family present at bedside this morning.  Questions and concerns addressed  __________________________________________________ Addendum:  I met with Adam Conner daughter, Adam Conner this afternoon. Adam Conner was sitting up in the chair for lunch. We briefly reviewed the past days events. Adam Conner shares with me that her sister will be here this afternoon with the MOST and  Proxy forms.  __________________________________________________ Addendum #2  Patient is now a DNAR/DNI. MOST and DNR order placed on chart.   Objective Assessment: Vital Signs Vitals:   05/30/20 2138 05/31/20 0444  BP: (!) 143/67 (!) 153/64  Pulse: 61 (!) 57  Resp: 16 19  Temp: 98 F (36.7 C) (!) 97.4 F (36.3 C)  SpO2: 100% 96%    Intake/Output Summary (Last 24 hours) at 05/31/2020 1028 Last data filed at 05/31/2020 0600 Gross per 24 hour  Intake 600 ml  Output 350 ml  Net 250 ml   Last Weight  Most recent update: 05/25/2020 10:19 AM   Weight  108.1 kg (238 lb 5.1 oz)           Gen:  Elderly M in NAD HEENT: moist mucous membranes CV: Regular rate and rhythm  PULM: 2LPM [place] ABD: soft/nontender  EXT: No edema  Neuro: Alert and oriented x1  SUMMARY OF RECOMMENDATIONS DNAR/DNI  MOST Completed, paper copy placed onto the chart electric copy can be found in Vynca  DNR Form Completed, paper copy placed onto the chart electric copy can be found in Vynca  Goal: short term SNF stay  TOC - OP Palliative care   PMT will incrementally follow along  Time Spent: 15 Greater than 50% of the time was spent in counseling and coordination of care ______________________________________________________________________________________ [place] Team Team Cell Phone: [phone] Please utilize secure chat with additional questions, if there is no response within 30 minutes please call the  above phone number  Palliative Medicine Team providers are available by phone from 7am to 7pm daily and can be reached through the team cell phone.  Should this patient require assistance outside of these hours, please call the patient's attending physician.

## 2020-05-31 NOTE — Progress Notes (Signed)
PROGRESS NOTE    Adam Conner  WGN:[id number] DOB: 09/21/1933 DOA: 05/25/2020 PCP: Patient, No Pcp Per (Inactive)   Chief Complaint  Patient presents with  . Respiratory Distress  Brief Narrative:  87 year old male with dementia, hypertension, BPH, generalized muscle weakness/deconditioning, PAF on Xarelto, hypertension, hyperlipidemia, morbid obesity, brought to the ED from [facility] on 4/4 for respiratory distress.  Recently admitted 2/24-3/4 MRSA bacteremia and was seen by ID, EP, he is a poor surgical candidate and EP did not recommend removal of pacemaker and recommended oral suppressive therapy, ID advised 6 weeks of vancomycin followed by oral suppressive therapy with doxycycline lifelong.  He was discharged to [facility] a skilled nursing facility. In the ED patient was febrile 101.3, with leukocytosis, lactic acidosis chest x-ray with mild bibasilar subsegmental atelectasis or possible pulmonary edema, started on broad-spectrum antibiotics vancomycin cefepime Flagyl due to concern for pneumonia. Given stridor distress patient was consulted by ENT and pulmonary CCM team-?Stridor likely from tracheobronchial narrowing.  Patient will start on diuresis with disease respiratory status is improved being treated for pleural effusion, acute diastolic CHF. He has been confused and more lethargic for few days ABG and lab work TSH ammonia level done and were unremarkable but  4/7 was more alert/mildly agitated. 4/9-poa at bedside, discussed.  Mentation appear to be at baseline.  Subjective: Seen and examined this morning. Patient ate his  Breakfast abd was taking a nap.Woke up and is oriented to self.  He thinks he is at his house.Afebrile overnight.On 2 L and this morning.  Assessme.nt & Plan:  Stridor/acute respiratory distress and failure with hypoxia/Tracheobronchomalacia:CT chest and soft tissue neck no abnormality in the neck or subglottis, ENT failed no impending airway compromise,ENT  was not able to evaluate the true vocal cords due to patient's lack of cooperation and unwillingness to consent.Was seen by pulmonary-felt that it is related to tracheobronchomalacia upper airway which could be function of aging and dementia related to loss of muscle tone and likely acute on chronic diastolic dysfunction as evidenced by pleural effusion.  Respiratory status stable with IV Lasix.  Will change to oral Lasix.  MRSA bacteremia on last admission 2/24-3/4 in the setting of PM/PM in place-not a candidate for PM removal as per cardiology:Patient has been on vancomycin and will complete 6 weeks therapy on 05/31/2020 following which he will need oral doxycycline lifelong.blood culture negative from 4/4.  Off cefepime.  Leukocytosis overall is stable.He has been afebrile.    Hypokalemia: Resolved.   Hypomagnesemia: Resolved Recent Labs  Lab 05/27/20 0329 05/28/20 0419 05/29/20 0415 05/30/20 0222 05/31/20 0309  K 3.0* 3.0* 3.2* 3.2* 3.5   Anemia likely from chronic disease.  Overall stable.  Monitor Recent Labs  Lab 05/27/20 0329 05/28/20 0419 05/29/20 0415 05/30/20 0222 05/31/20 0309  HGB 10.1* 10.2* 10.7* 10.6* 10.9*  HCT 32.1* 32.1* 34.1* 33.8* 34.6*   Pleural effusion bilateral, suspecting diastolic CHF, acute:recent echo 2/24 EF 60 to 65%, off HCTZ stopped and placed on IV Lasix.  Repeat chest x-ray showed congestion 05/27/20.  We will switch to oral Lasix today.  Monitor intake output Filed Weights   05/25/20 1018  Weight: 108.1 kg  Net IO Since Admission: 1,192.21 mL [05/31/20 0833] Recent Labs  Lab 05/27/20 0329 05/28/20 0419 05/29/20 0415 05/30/20 0222 05/31/20 0309  BUN CREATININE 0.95 0.93 0.89 0.78 0.82   Acute encephalopathy suspect metabolic in the background of dementia Dementia: Patient has been lethargic for few  days but appears to be more alert awake.  He does appear to be sleeping in the daytime and awaking at night likely contributing to  his sleepiness lethargy in the daytime.  He is oriented x1.  His ABG shows no CO2 retention, TSH/ammonia l level/B12 normal, B1 pending.He has baseline dementia oriented to self. Continue fall precaution supportive care delirium precaution less alert awake.  Appreciate speech therapy on board   Essential hypertension: controlled on lisinopril lasix  PAF cont his Xarelto  BPH: On Flomax oxybutynin   History of alcohol dependence no signs symptoms symptoms of withdrawal.  He has been hospitalized recently and has been living in a skilled nursing facility since then.  Morbid obesity BMI 35: Will benefit with PCP follow-up and weight loss   WUJ:WJXBJYN is elderly debilitated multiple comorbidities remains full code.Overall prognosis remains guarded with  frequent hospitalization.I have discussed with patient's POA over the phone, prognosis remains to be seen, palliative care has been consulted, continue on current full scope of treatment.  Palliative care has had family meeting again 4/9 given MOST form. Remains full code.  Diet Order            Diet heart healthy/carb modified Room service appropriate? Yes; Fluid consistency: Thin  Diet effective now                 DVT prophylaxis: rivaroxaban (XARELTO) tablet 20 mg Start: 05/25/20 2300 Code Status:   Code Status: Full Code  Family Communication: plan of care discussed with patient at bedside. Updated daughter POA previously. Updated patient's POA at the bedside 4/19  Status is: Inpatient Remains inpatient appropriate because:Ongoing diagnostic testing needed not appropriate for outpatient work up, IV treatments appropriate due to intensity of illness or inability to take PO and Inpatient level of care appropriate due to severity of illness  Dispo: The patient is from: SNF              Anticipated d/c is to: SNF.Daughter reluctant about sending him back to [facility], social worker notified and looking into short-term skilled nursing  facility.              Patient currently is medically stable   Difficult to place patient No  Unresulted Labs (From admission, onward)          Start     Ordered   05/27/20 1707  Vitamin B1  Add-on,   AD       Question:  Specimen collection method  Answer:  IV Team=IV Team collect   05/27/20 1706        Medications reviewed:  Scheduled Meds: . Chlorhexidine Gluconate Cloth  6 each Topical Daily  . [START ON 06/01/2020] doxycycline  100 mg Oral Q12H  . furosemide  40 mg Intravenous Daily  . lisinopril  20 mg Oral QHS  . magnesium oxide  400 mg Oral Daily  . mouth rinse  15 mL Mouth Rinse BID  . melatonin  3 mg Oral QHS  . oxybutynin  5 mg Oral QHS  . pantoprazole  40 mg Oral QHS  . rivaroxaban  20 mg Oral QHS  . sodium chloride flush  10-40 mL Intracatheter Q12H  . sodium chloride flush  3 mL Intravenous Q12H  . tamsulosin  0.4 mg Oral QPC breakfast  . thiamine  100 mg Oral q AM   Continuous Infusions: . sodium chloride    . vancomycin 1,000 mg (05/31/20 0153)    Consultants:see note  Procedures:see  note  Antimicrobials: Anti-infectives (From admission, onward)   Start     Dose/Rate Route Frequency Ordered Stop   06/01/20 1000  doxycycline (VIBRA-TABS) tablet 100 mg        100 mg Oral Every 12 hours 05/29/20 1317     05/31/20 1000  doxycycline (VIBRA-TABS) tablet 100 mg  Status:  Discontinued        100 mg Oral Every 12 hours 05/28/20 1448 05/29/20 1317   05/26/20 1500  metroNIDAZOLE (FLAGYL) IVPB 500 mg  Status:  Discontinued        500 mg 100 mL/hr over 60 Minutes Intravenous Every 8 hours 05/26/20 1413 05/27/20 0842   05/25/20 1930  ceFEPIme (MAXIPIME) 2 g in sodium chloride 0.9 % 100 mL IVPB  Status:  Discontinued        2 g 200 mL/hr over 30 Minutes Intravenous Every 8 hours 05/25/20 1310 05/28/20 0849   05/25/20 1430  vancomycin (VANCOREADY) IVPB 1000 mg/200 mL        1,000 mg 200 mL/hr over 60 Minutes Intravenous Every 12 hours 05/25/20 1309 05/31/20 2359    05/25/20 1115  ceFEPIme (MAXIPIME) 2 g in sodium chloride 0.9 % 100 mL IVPB        2 g 200 mL/hr over 30 Minutes Intravenous  Once 05/25/20 1106 05/25/20 1158     Culture/Microbiology    Component Value Date/Time   SDES BLOOD RIGHT HAND 05/25/2020 1056   SPECREQUEST  05/25/2020 1056    BOTTLES DRAWN AEROBIC AND ANAEROBIC Blood Culture results may not be optimal due to an inadequate volume of blood received in culture bottles   CULT  05/25/2020 1056    NO GROWTH 5 DAYS Performed at [facility], [address] [place], [place] [address]    REPTSTATUS 05/30/2020 FINAL 05/25/2020 1056    Other culture-see note  Objective: Vitals: Today's Vitals   05/30/20 1733 05/30/20 2024 05/30/20 2138 05/31/20 0444  BP: (!) 134/59  (!) 143/67 (!) 153/64  Pulse: (!) 56  61 (!) 57  Resp: 18  16 19   Temp: 97.8 F (36.6 C)  98 F (36.7 C) (!) 97.4 F (36.3 C)  TempSrc:   Oral Oral  SpO2:   100% 96%  Weight:      Height:      PainSc:  0-No pain      Intake/Output Summary (Last 24 hours) at 05/31/2020 0833 Last data filed at 05/31/2020 0600 Gross per 24 hour  Intake 720 ml  Output 1450 ml  Net -730 ml   Filed Weights   05/25/20 1018  Weight: 108.1 kg   Weight change:   Intake/Output from previous day: 04/09 0701 - 04/10 0700 In: 720 [P.O.:720] Out: 1450 [Urine:1450] Intake/Output this shift: No intake/output data recorded. Filed Weights   05/25/20 1018  Weight: 108.1 kg    Examination: General exam: AAOx1 to self, obese, on 1 L [place],NAD, weak appearing. HEENT:Oral mucosa moist, Ear/Nose WNL grossly, dentition normal. Respiratory system: bilaterally clear,no wheezing or crackles,no use of accessory muscle Cardiovascular system: S1 & S2 +, No JVD,. Gastrointestinal system: Abdomen soft, NT,ND, BS+ Nervous System:Alert, awake, moving extremities and grossly nonfocal Extremities: mild edema, distal peripheral pulses palpable.  Skin: No rashes,no icterus. MSK: Normal  muscle bulk,tone, power  Data Reviewed: I have personally reviewed following labs and imaging studies CBC: Recent Labs  Lab 05/27/20 0329 05/28/20 0419 05/29/20 0415 05/30/20 0222 05/31/20 0309  WBC 12.4* 12.5* 12.3* 10.7* 11.7*  NEUTROABS 9.2* 9.5*  9.0* 7.5 8.5*  HGB 10.1* 10.2* 10.7* 10.6* 10.9*  HCT 32.1* 32.1* 34.1* 33.8* 34.6*  MCV 90.9 91.7 91.4 92.1 93.0  PLT 178 199 226 215 238   Basic Metabolic Panel: Recent Labs  Lab 05/27/20 0329 05/28/20 0419 05/29/20 0415 05/30/20 0222 05/31/20 0309  NA 142 139 141 140 142  K 3.0* 3.0* 3.2* 3.2* 3.5  CL 112* 108 109 109 109  CO2 24 24 24 26 28   GLUCOSE 116* 114* 116* 113* 113*  BUN 20 18 17 16 16   CREATININE 0.95 0.93 0.89 0.78 0.82  CALCIUM 8.7* 8.4* 8.6* 8.5* 8.8*  MG 1.7 1.6* 2.0 1.8 1.9   GFR: Estimated Creatinine Clearance: 82.5 mL/min (by C-G formula based on SCr of 0.82 mg/dL). Liver Function Tests: Recent Labs  Lab 05/25/20 1009  AST 25  ALT 17  ALKPHOS 78  BILITOT 1.8*  PROT 6.7  ALBUMIN 3.1*   No results for input(s): LIPASE, AMYLASE in the last 168 hours. Recent Labs  Lab 05/27/20 2222  AMMONIA 11   Coagulation Profile: No results for input(s): INR, PROTIME in the last 168 hours. Cardiac Enzymes: No results for input(s): CKTOTAL, CKMB, CKMBINDEX, TROPONINI in the last 168 hours. BNP (last 3 results) No results for input(s): PROBNP in the last 8760 hours. HbA1C: No results for input(s): HGBA1C in the last 72 hours. CBG: No results for input(s): GLUCAP in the last 168 hours. Lipid Profile: No results for input(s): CHOL, HDL, LDLCALC, TRIG, CHOLHDL, LDLDIRECT in the last 72 hours. Thyroid Function Tests: No results for input(s): TSH, T4TOTAL, FREET4, T3FREE, THYROIDAB in the last 72 hours. Anemia Panel: No results for input(s): VITAMINB12, FOLATE, FERRITIN, TIBC, IRON, RETICCTPCT in the last 72 hours. Sepsis Labs: Recent Labs  Lab 05/25/20 1028 05/25/20 1228 05/26/20 1750 05/27/20 0329  05/28/20 0419  PROCALCITON  --   --  0.30 0.24 0.13  LATICACIDVEN 3.6* 3.1*  --   --   --     Recent Results (from the past 240 hour(s))  Culture, blood (routine x 2)     Status: None   Collection Time: 05/25/20 10:50 AM   Specimen: BLOOD RIGHT ARM  Result Value Ref Range Status   Specimen Description BLOOD RIGHT ARM  Final   Special Requests   Final    BOTTLES DRAWN AEROBIC AND ANAEROBIC Blood Culture results may not be optimal due to an inadequate volume of blood received in culture bottles   Culture   Final    NO GROWTH 5 DAYS Performed at [facility], [address] [place], [address] [place]    Report Status 05/30/2020 FINAL  Final  Culture, blood (routine x 2)     Status: None   Collection Time: 05/25/20 10:56 AM   Specimen: BLOOD RIGHT HAND  Result Value Ref Range Status   Specimen Description BLOOD RIGHT HAND  Final   Special Requests   Final    BOTTLES DRAWN AEROBIC AND ANAEROBIC Blood Culture results may not be optimal due to an inadequate volume of blood received in culture bottles   Culture   Final    NO GROWTH 5 DAYS Performed at [facility], [address], [place], [address] [place]    Report Status 05/30/2020 FINAL  Final  SARS CORONAVIRUS 2 (TAT 6-24 HRS) Nasopharyngeal Nasopharyngeal Swab     Status: None   Collection Time: 05/25/20  9:00 PM   Specimen: Nasopharyngeal Swab  Result Value Ref Range Status  SARS Coronavirus 2 NEGATIVE NEGATIVE Final    Comment: (NOTE) SARS-CoV-2 target nucleic acids are NOT DETECTED.  The SARS-CoV-2 RNA is generally detectable in upper and lower respiratory specimens during the acute phase of infection. Negative results do not preclude SARS-CoV-2 infection, do not rule out co-infections with other pathogens, and should not be used as the sole basis for treatment or other patient management decisions. Negative results must be combined with clinical observations, patient history, and epidemiological  information. The expected result is Negative.  Fact Sheet for Patients: HairSlick.nohttps://www.fda.gov/media/138098/download  Fact Sheet for Healthcare Providers: quierodirigir.comhttps://www.fda.gov/media/138095/download  This test is not yet approved or cleared by the [place] FDA and  has been authorized for detection and/or diagnosis of SARS-CoV-2 by FDA under an Emergency Use Authorization (EUA). This EUA will remain  in effect (meaning this test can be used) for the duration of the COVID-19 declaration under Se ction 564(b)(1) of the Act, 21 U.S.C. section 360bbb-3(b)(1), unless the authorization is terminated or revoked sooner.  Performed at [facility], [address] [place], [place] [address]   Respiratory (~20 pathogens) panel by PCR     Status: None   Collection Time: 05/26/20  8:54 AM   Specimen: Nasopharyngeal Swab; Respiratory  Result Value Ref Range Status   Adenovirus NOT DETECTED NOT DETECTED Final   Coronavirus 229E NOT DETECTED NOT DETECTED Final    Comment: (NOTE) The Coronavirus on the Respiratory Panel, DOES NOT test for the novel  Coronavirus (2019 nCoV)    Coronavirus HKU1 NOT DETECTED NOT DETECTED Final   Coronavirus NL63 NOT DETECTED NOT DETECTED Final   Coronavirus OC43 NOT DETECTED NOT DETECTED Final   Metapneumovirus NOT DETECTED NOT DETECTED Final   Rhinovirus / Enterovirus NOT DETECTED NOT DETECTED Final   Influenza A NOT DETECTED NOT DETECTED Final   Influenza B NOT DETECTED NOT DETECTED Final   Parainfluenza Virus 1 NOT DETECTED NOT DETECTED Final   Parainfluenza Virus 2 NOT DETECTED NOT DETECTED Final   Parainfluenza Virus 3 NOT DETECTED NOT DETECTED Final   Parainfluenza Virus 4 NOT DETECTED NOT DETECTED Final   Respiratory Syncytial Virus NOT DETECTED NOT DETECTED Final   Bordetella pertussis NOT DETECTED NOT DETECTED Final   Bordetella Parapertussis NOT DETECTED NOT DETECTED Final   Chlamydophila pneumoniae NOT DETECTED NOT DETECTED Final    Mycoplasma pneumoniae NOT DETECTED NOT DETECTED Final    Comment: Performed at [facility], [address] [place], [place] [address]     Radiology Studies: No results found.   LOS: 6 days   Lanae Boastamesh Delphina Schum, MD [facility]  05/31/2020, 8:33 AM

## 2020-06-01 DIAGNOSIS — R061 Stridor: Secondary | ICD-10-CM | POA: Diagnosis not present

## 2020-06-01 NOTE — TOC Progression Note (Signed)
Transition of Care [facility]) - Progression Note    Patient Details  Name: NETANEL YANNUZZI MRN: [id number] Date of Birth: Mar 08, 1933  Transition of Care [facility]) CM/SW Contact  Okey Dupre Lazaro Arms, LCSW Phone Number: 06/01/2020, 4:00 PM  Clinical Narrative: Search continues for a skilled facility for patient in the [place] area, per daughter's request.  *Follow-up call made to [facility] nursing facility [phone]) and talked with admissions director Erie Noe. They reviewed patient's information that was sent 4/8 and is unable to accept Mr. Heilman as he appears more appropriate for LTC and they have their wait list is a year and they have no LTC Medicaid beds.     [facility] [phone]): Talked with admissions director Katie regarding patient and nursing will review patient's information. CSW advised that patient will have to pay a week of co-pays in advance - $194.50 per day, and she will need information on patient's ability to pay. Florentina Addison was provided with daughter's name and number and this CSW's name and number and was informed that CSW will receive a follow-up call. Contacted daughter Donnajean Lopes [phone]) and updated provided regarding call with admissions director.    Contacted daughter Elzie Rings and update provided regarding calls to [facility] and [facility] and provided her with name of [facility]. She provided CSW with other SNF's to contact: 1. [facility] [address] [place] [phone] 2. [facility] [phone] 3. [facility] - [phone]  [facility] - call made and voicemail for admissions full *[facility] - Left message for [place], admissions director *[facility] - Spoke with Denny Peon, admissions director and clinicals faxed    [phone]) to facility.  [facility]  [phone]) - (from 4/8 call with daughter). Left message for admissions  staff person.  CSW will  follow up with SNF's that voice messages were left and continue working on placement for patient.          Expected Discharge Plan: Skilled Nursing Facility Barriers to Discharge: Other (comment) Research scientist (medical) underway)  Expected Discharge Plan and Services Expected Discharge Plan: Skilled Nursing Facility In-house Referral: Clinical Social Work     Living arrangements for the past 2 months: Single Family Home                                       Social Determinants of Health (SDOH) Interventions    Readmission Risk Interventions No flowsheet data found.

## 2020-06-01 NOTE — Progress Notes (Signed)
PROGRESS NOTE    Adam PorchMoses M Conner  ZOX:[id number] DOB: 1934-02-22 DOA: 05/25/2020 PCP: Adam Conner, No Pcp Per (Inactive)   Chief Complaint  Adam Conner presents with  . Respiratory Distress  Brief Narrative:  87 year old male with dementia, hypertension, BPH, generalized muscle weakness/deconditioning, PAF on Xarelto, hypertension, hyperlipidemia, morbid obesity, brought to the ED from [facility] on 4/4 for respiratory distress.  Recently admitted 2/24-3/4 MRSA bacteremia and was seen by ID, EP, he is a poor surgical candidate and EP did not recommend removal of pacemaker and recommended oral suppressive therapy, ID advised 6 weeks of vancomycin followed by oral suppressive therapy with doxycycline lifelong.  [facility]. In the ED Adam Conner was febrile 101.3, with leukocytosis, lactic acidosis chest x-ray with mild bibasilar subsegmental atelectasis or possible pulmonary edema, started on broad-spectrum antibiotics vancomycin cefepime Flagyl due to concern for pneumonia. Given stridor distress Adam Conner was consulted by ENT and pulmonary CCM team-?Stridor likely from tracheobronchial narrowing.  Adam Conner will start on diuresis with disease respiratory status is improved being treated for pleural effusion, acute diastolic CHF. He has been confused and more lethargic for few days ABG and lab work TSH ammonia level done and were unremarkable but  4/7 was more alert/mildly agitated. 4/9-poa at bedside, discussed.  Mentation appear to be at baseline. Palliative care meeting was completed MOST form completed and changed to DNR 4/10.  Subjective: Resting well. Alert,awake, oriented to self, confused Overnight no fever.   Doing well and tolerated nasal cannula,blood pressure on higher side  Assessme.nt & Plan:  Stridor/acute respiratory distress and failure with hypoxia/Tracheobronchomalacia:CT chest and soft tissue neck no abnormality in the neck or subglottis, ENT  failed no impending airway compromise,ENT was not able to evaluate the true vocal cords but no acite intervention needed-seen by pulmonary-felt that it is related to tracheobronchomalacia upper airway which could be function of aging and dementia related to loss of muscle tone and likely acute on chronic diastolic dysfunction as evidenced by pleural effusion.  Adam Conner did evaluate diuresis.  Now on oral Lasix.  Continue supplemental oxygen 2 L upon discharge.   MRSA bacteremia on last admission 2/24-3/4 in the setting of PM/PM in place- not a candidate for PM removal as per cardiology:Adam Conner has been on vancomycin completed 6 therapy 06/01/2018 now on oral doxycycline lifelong suppressive therapy.  Blood culture no growth this admission 4/4.   Hypokalemia/hypomagnesemia repleted and resolved.  Anemia likely from chronic disease.  Stable. Recent Labs  Lab 05/27/20 0329 05/28/20 0419 05/29/20 0415 05/30/20 0222 05/31/20 0309  HGB 10.1* 10.2* 10.7* 10.6* 10.9*  HCT 32.1* 32.1* 34.1* 33.8* 34.6*   Pleural effusion bilateral, suspecting diastolic CHF, acute:recent echo 2/24 EF 60 to 65%, off HCTZ stopped and placed on IV Lasix.Repeat CXR-showed congestion 05/27/20.Now on oral Lasix will continue the same.Monitor intake output daily as below.  Filed Weights   05/25/20 1018 05/31/20 1957  Weight: 108.1 kg 107.7 kg  Net IO Since Admission: 1,242.21 mL [06/01/20 0816] Recent Labs  Lab 05/27/20 0329 05/28/20 0419 05/29/20 0415 05/30/20 0222 05/31/20 0309  BUN 20 18 17 16 16   CREATININE 0.95 0.93 0.89 0.78 0.82   Acute encephalopathy suspect metabolic in the background of dementia. Dementia: Adam Conner has been lethargic for few days but it has resolved.  He is alert awake oriented x1.  Work-up with normal ABG TSH, and B12 level.  Overall reflective of his dementia continue on delirium precaution fall precaution supportive measures.  Continue short-term  skilled nursing facility.   Essential  hypertension: Fairly controlled, continue lisinopril lasix  PAF continue Xarelto.  BPH: Continue Flomax oxybutynin   History of alcohol dependence no signs symptoms symptoms of withdrawal.  He has been hospitalized recently and has been living in a skilled nursing facility since then.  Morbid obesity BMI 35: Will benefit with PCP follow-up and weight loss   MVH:QIONGEX is elderly debilitated multiple comorbidities.  Palliative care consultation appreciated MOST form completed and now changed to DNR.  Overall prognosis does not appear bright, he will likely be high risk of readmissions.  Diet Order            Diet heart healthy/carb modified Room service appropriate? Yes; Fluid consistency: Thin  Diet effective now                 DVT prophylaxis: rivaroxaban (XARELTO) tablet 20 mg Start: 05/25/20 2300 Code Status:   Code Status: DNR  Family Communication: plan of care discussed with Adam Conner at bedside. Updated daughter POA previously. Updated Adam Conner's POA at the bedside 4/19  Status is: Inpatient Remains inpatient appropriate because:Ongoing diagnostic testing needed not appropriate for outpatient work up, IV treatments appropriate due to intensity of illness or inability to take PO and Inpatient level of care appropriate due to severity of illness  Dispo: The Adam Conner is from: SNF              Anticipated d/c is to: SNF. awiting placement - not back to [facility],              Adam Conner currently is medically stable   Difficult to place Adam Conner No  Unresulted Labs (From admission, onward)          Start     Ordered   05/27/20 1707  Vitamin B1  Add-on,   AD       Question:  Specimen collection method  Answer:  IV Team=IV Team collect   05/27/20 1706        Medications reviewed:  Scheduled Meds: . Chlorhexidine Gluconate Cloth  6 each Topical Daily  . doxycycline  100 mg Oral Q12H  . furosemide  20 mg Oral Daily  . lisinopril  20 mg Oral QHS  . magnesium oxide  400 mg  Oral Daily  . mouth rinse  15 mL Mouth Rinse BID  . melatonin  3 mg Oral QHS  . oxybutynin  5 mg Oral QHS  . pantoprazole  40 mg Oral QHS  . rivaroxaban  20 mg Oral QHS  . sodium chloride flush  10-40 mL Intracatheter Q12H  . sodium chloride flush  3 mL Intravenous Q12H  . tamsulosin  0.4 mg Oral QPC breakfast  . thiamine  100 mg Oral q AM   Continuous Infusions: . sodium chloride      Consultants:see note  Procedures:see note  Antimicrobials: Anti-infectives (From admission, onward)   Start     Dose/Rate Route Frequency Ordered Stop   06/01/20 1000  doxycycline (VIBRA-TABS) tablet 100 mg        100 mg Oral Every 12 hours 05/29/20 1317     05/31/20 1000  doxycycline (VIBRA-TABS) tablet 100 mg  Status:  Discontinued        100 mg Oral Every 12 hours 05/28/20 1448 05/29/20 1317   05/26/20 1500  metroNIDAZOLE (FLAGYL) IVPB 500 mg  Status:  Discontinued        500 mg 100 mL/hr over 60 Minutes Intravenous Every 8 hours 05/26/20 1413  05/27/20 0842   05/25/20 1930  ceFEPIme (MAXIPIME) 2 g in sodium chloride 0.9 % 100 mL IVPB  Status:  Discontinued        2 g 200 mL/hr over 30 Minutes Intravenous Every 8 hours 05/25/20 1310 05/28/20 0849   05/25/20 1430  vancomycin (VANCOREADY) IVPB 1000 mg/200 mL        1,000 mg 200 mL/hr over 60 Minutes Intravenous Every 12 hours 05/25/20 1309 05/31/20 2359   05/25/20 1115  ceFEPIme (MAXIPIME) 2 g in sodium chloride 0.9 % 100 mL IVPB        2 g 200 mL/hr over 30 Minutes Intravenous  Once 05/25/20 1106 05/25/20 1158     Culture/Microbiology    Component Value Date/Time   SDES BLOOD RIGHT HAND 05/25/2020 1056   SPECREQUEST  05/25/2020 1056    BOTTLES DRAWN AEROBIC AND ANAEROBIC Blood Culture results may not be optimal due to an inadequate volume of blood received in culture bottles   CULT  05/25/2020 1056    NO GROWTH 5 DAYS Performed at [facility], [address], [place], [place] [address]    REPTSTATUS 05/30/2020 FINAL 05/25/2020  1056    Other culture-see note  Objective: Vitals: Today's Vitals   05/31/20 1700 05/31/20 1957 05/31/20 2000 06/01/20 0645  BP: (!) 149/61 (!) 170/74  (!) 184/80  Pulse: 61 60  62  Resp: 18 16  20   Temp: 97.9 F (36.6 C) 98.6 F (37 C)  98.6 F (37 C)  TempSrc: Oral Oral  Oral  SpO2: 98% 100%  98%  Weight:  107.7 kg    Height:      PainSc:   0-No pain     Intake/Output Summary (Last 24 hours) at 06/01/2020 0816 Last data filed at 06/01/2020 0600 Gross per 24 hour  Intake 500 ml  Output 450 ml  Net 50 ml   Filed Weights   05/25/20 1018 05/31/20 1957  Weight: 108.1 kg 107.7 kg   Weight change:   Intake/Output from previous day: 04/10 0701 - 04/11 0700 In: 500 [P.O.:500] Out: 450 [Urine:450] Intake/Output this shift: No intake/output data recorded. Filed Weights   05/25/20 1018 05/31/20 1957  Weight: 108.1 kg 107.7 kg    Examination: General exam: AAOx1 , NAD, weak appearing. HEENT:Oral mucosa moist, Ear/Nose WNL grossly, dentition normal. Respiratory system:Bilaterally clear,no wheezing or crackles,no use of accessory muscle Cardiovascular system:S1 & S2 +, No JVD,. Gastrointestinal system: Abdomen soft, NT,ND, BS+ Nervous System:Alert,awake, confused,moving extremities and grossly non-focal. Extremities: No edema, distal peripheral pulses palpable.  Skin: No rashes,no icterus. MSK: Normal muscle bulk,tone, power.  Data Reviewed: I have personally reviewed following labs and imaging studies CBC: Recent Labs  Lab 05/27/20 0329 05/28/20 0419 05/29/20 0415 05/30/20 0222 05/31/20 0309  WBC 12.4* 12.5* 12.3* 10.7* 11.7*  NEUTROABS 9.2* 9.5* 9.0* 7.5 8.5*  HGB 10.1* 10.2* 10.7* 10.6* 10.9*  HCT 32.1* 32.1* 34.1* 33.8* 34.6*  MCV 90.9 91.7 91.4 92.1 93.0  PLT 178 199 226 215 238   Basic Metabolic Panel: Recent Labs  Lab 05/27/20 0329 05/28/20 0419 05/29/20 0415 05/30/20 0222 05/31/20 0309  NA 142 139 141 140 142  K 3.0* 3.0* 3.2* 3.2* 3.5  CL  112* 108 109 109 109  CO2 24 24 24 26 28   GLUCOSE 116* 114* 116* 113* 113*  BUN 20 18 17 16 16   CREATININE 0.95 0.93 0.89 0.78 0.82  CALCIUM 8.7* 8.4* 8.6* 8.5* 8.8*  MG 1.7 1.6* 2.0 1.8 1.9  GFR: Estimated Creatinine Clearance: 82.4 mL/min (by C-G formula based on SCr of 0.82 mg/dL). Liver Function Tests: Recent Labs  Lab 05/25/20 1009  AST 25  ALT 17  ALKPHOS 78  BILITOT 1.8*  PROT 6.7  ALBUMIN 3.1*   No results for input(s): LIPASE, AMYLASE in the last 168 hours. Recent Labs  Lab 05/27/20 2222  AMMONIA 11   Coagulation Profile: No results for input(s): INR, PROTIME in the last 168 hours. Cardiac Enzymes: No results for input(s): CKTOTAL, CKMB, CKMBINDEX, TROPONINI in the last 168 hours. BNP (last 3 results) No results for input(s): PROBNP in the last 8760 hours. HbA1C: No results for input(s): HGBA1C in the last 72 hours. CBG: No results for input(s): GLUCAP in the last 168 hours. Lipid Profile: No results for input(s): CHOL, HDL, LDLCALC, TRIG, CHOLHDL, LDLDIRECT in the last 72 hours. Thyroid Function Tests: No results for input(s): TSH, T4TOTAL, FREET4, T3FREE, THYROIDAB in the last 72 hours. Anemia Panel: No results for input(s): VITAMINB12, FOLATE, FERRITIN, TIBC, IRON, RETICCTPCT in the last 72 hours. Sepsis Labs: Recent Labs  Lab 05/25/20 1028 05/25/20 1228 05/26/20 1750 05/27/20 0329 05/28/20 0419  PROCALCITON  --   --  0.30 0.24 0.13  LATICACIDVEN 3.6* 3.1*  --   --   --     Recent Results (from the past 240 hour(s))  Culture, blood (routine x 2)     Status: None   Collection Time: 05/25/20 10:50 AM   Specimen: BLOOD RIGHT ARM  Result Value Ref Range Status   Specimen Description BLOOD RIGHT ARM  Final   Special Requests   Final    BOTTLES DRAWN AEROBIC AND ANAEROBIC Blood Culture results may not be optimal due to an inadequate volume of blood received in culture bottles   Culture   Final    NO GROWTH 5 DAYS Performed at [facility]  Lab, [address] [place], [place] [address]    Report Status 05/30/2020 FINAL  Final  Culture, blood (routine x 2)     Status: None   Collection Time: 05/25/20 10:56 AM   Specimen: BLOOD RIGHT HAND  Result Value Ref Range Status   Specimen Description BLOOD RIGHT HAND  Final   Special Requests   Final    BOTTLES DRAWN AEROBIC AND ANAEROBIC Blood Culture results may not be optimal due to an inadequate volume of blood received in culture bottles   Culture   Final    NO GROWTH 5 DAYS Performed at [facility], [address], [place], [place] [address]    Report Status 05/30/2020 FINAL  Final  SARS CORONAVIRUS 2 (TAT 6-24 HRS) Nasopharyngeal Nasopharyngeal Swab     Status: None   Collection Time: 05/25/20  9:00 PM   Specimen: Nasopharyngeal Swab  Result Value Ref Range Status   SARS Coronavirus 2 NEGATIVE NEGATIVE Final    Comment: (NOTE) SARS-CoV-2 target nucleic acids are NOT DETECTED.  The SARS-CoV-2 RNA is generally detectable in upper and lower respiratory specimens during the acute phase of infection. Negative results do not preclude SARS-CoV-2 infection, do not rule out co-infections with other pathogens, and should not be used as the sole basis for treatment or other Adam Conner management decisions. Negative results must be combined with clinical observations, Adam Conner history, and epidemiological information. The expected result is Negative.  Fact Sheet for Patients: HairSlick.no  Fact Sheet for Healthcare Providers: quierodirigir.com  This test is not yet approved or cleared by the [place] FDA and  has been authorized for detection and/or diagnosis of SARS-CoV-2 by FDA under an Emergency Use Authorization (EUA). This EUA will remain  in effect (meaning this test can be used) for the duration of the COVID-19 declaration under Se ction 564(b)(1) of the Act, 21 U.S.C. section 360bbb-3(b)(1), unless the  authorization is terminated or revoked sooner.  Performed at [facility], [address], [place], [place] [address]   Respiratory (~20 pathogens) panel by PCR     Status: None   Collection Time: 05/26/20  8:54 AM   Specimen: Nasopharyngeal Swab; Respiratory  Result Value Ref Range Status   Adenovirus NOT DETECTED NOT DETECTED Final   Coronavirus 229E NOT DETECTED NOT DETECTED Final    Comment: (NOTE) The Coronavirus on the Respiratory Panel, DOES NOT test for the novel  Coronavirus (2019 nCoV)    Coronavirus HKU1 NOT DETECTED NOT DETECTED Final   Coronavirus NL63 NOT DETECTED NOT DETECTED Final   Coronavirus OC43 NOT DETECTED NOT DETECTED Final   Metapneumovirus NOT DETECTED NOT DETECTED Final   Rhinovirus / Enterovirus NOT DETECTED NOT DETECTED Final   Influenza A NOT DETECTED NOT DETECTED Final   Influenza B NOT DETECTED NOT DETECTED Final   Parainfluenza Virus 1 NOT DETECTED NOT DETECTED Final   Parainfluenza Virus 2 NOT DETECTED NOT DETECTED Final   Parainfluenza Virus 3 NOT DETECTED NOT DETECTED Final   Parainfluenza Virus 4 NOT DETECTED NOT DETECTED Final   Respiratory Syncytial Virus NOT DETECTED NOT DETECTED Final   Bordetella pertussis NOT DETECTED NOT DETECTED Final   Bordetella Parapertussis NOT DETECTED NOT DETECTED Final   Chlamydophila pneumoniae NOT DETECTED NOT DETECTED Final   Mycoplasma pneumoniae NOT DETECTED NOT DETECTED Final    Comment: Performed at [facility], [address] [place], [place] [address]     Radiology Studies: No results found.   LOS: 7 days   Lanae Boast, MD [facility]  06/01/2020, 8:16 AM

## 2020-06-01 NOTE — Progress Notes (Signed)
Physical Therapy Treatment Patient Details Name: Adam Conner MRN: [id number] DOB: 27-Sep-1933 Today's Date: 06/01/2020    History of Present Illness 87 y.o. male presents to [facility] ED on 4/4 from [facility] with progressive SOB, stridor, and acute BLE edema. Pt with recent admission for osteomyelitis L foot. Chest x-ray was suggestive of some pulmonary edema and mild bibasilar atelectasis. 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    PT Comments    Continuing work on functional mobility and activity tolerance;  Attempted sit to stand transfers with the use of the stedy, with the hopes of pt being able to use the aneterior bar to pull forward on; still with difficulty coming to stand, even with +2 Total assist; REcommend mechanical lift for safe OOB to recliner or wheelchair transfers   Follow Up Recommendations  SNF     Equipment Recommendations  Wheelchair (measurements PT);Wheelchair cushion (measurements PT);Hospital bed (mechanical lift if D/C home)    Recommendations for Other Services       Precautions / Restrictions Precautions Precautions: Fall    Mobility  Bed Mobility Overal bed mobility: Needs Assistance Bed Mobility: Rolling;Sidelying to Sit;Sit to Supine Rolling: Max assist Sidelying to sit: Max assist Supine to sit: Max assist;HOB elevated     General bed mobility comments: Hand over hand guidance for bedrail, pt actively resisting with movement at B LEs    Transfers Overall transfer level: Needs assistance   Transfers: Sit to/from Stand Sit to Stand: Total assist;+2 physical assistance;From elevated surface         General transfer comment: Attempted sit to stand x3 but unable to clear hips off bed, decreased anterior lean; employed stedy with  the hopes of the bar in front would help alleviate fear of falling anteriorly; Able to hold the bar, and pull on it to help lean trunk forward, bu tstill very limited push though bil LEs  Ambulation/Gait                 Stairs             Wheelchair Mobility    Modified Rankin (Stroke Patients Only)       Balance     Sitting balance-Leahy Scale: Fair                                      Cognition Arousal/Alertness: Awake/alert Behavior During Therapy: WFL for tasks assessed/performed Overall Cognitive Status: History of cognitive impairments - at baseline                                 General Comments: Slowed processing, difficulty following commands, did get irritated at times, but family present who helped calm pt      Exercises      General Comments        Pertinent Vitals/Pain Pain Assessment: Faces Faces Pain Scale: No hurt Pain Intervention(s): Monitored during session    Home Living                      Prior Function            PT Goals (current goals can now be  found in the care plan section) Acute Rehab PT Goals Patient Stated Goal: to get out of bed PT Goal Formulation: With patient Time For Goal Achievement: 06/09/20 Potential to Achieve Goals: Fair Progress towards PT goals: Progressing toward goals (very slowly)    Frequency    Min 2X/week      PT Plan Current plan remains appropriate    Co-evaluation              AM-PAC PT "6 Clicks" Mobility   Outcome Measure  Help needed turning from your back to your side while in a flat bed without using bedrails?: A Lot Help needed moving from lying on your back to sitting on the side of a flat bed without using bedrails?: A Lot Help needed moving to and from a bed to a chair (including a wheelchair)?: Total Help needed standing up from a chair using your arms (e.g., wheelchair or bedside chair)?: Total Help needed to walk in  hospital room?: Total Help needed climbing 3-5 steps with a railing? : Total 6 Click Score: 8    End of Session Equipment Utilized During Treatment: Gait belt Activity Tolerance: Patient tolerated treatment well Patient left: in bed;with call bell/phone within reach;with bed alarm set;Other (comment) (bed in semi-chair position) Nurse Communication: Mobility status;Need for lift equipment PT Visit Diagnosis: Other abnormalities of gait and mobility (R26.89);Muscle weakness (generalized) (M62.81)     Time: 1200-1230 PT Time Calculation (min) (ACUTE ONLY): 30 min  Charges:  $Therapeutic Activity: 23-37 mins                     Adam Conner, [place]  Acute Rehabilitation Services Pager [phone] Office [phone]    Adam Conner 06/01/2020, 8:12 PM

## 2020-06-02 ENCOUNTER — Inpatient Hospital Stay: Payer: Medicare PPO | Admitting: Infectious Diseases

## 2020-06-02 ENCOUNTER — Encounter (HOSPITAL_COMMUNITY): Payer: Self-pay | Admitting: Internal Medicine

## 2020-06-02 ENCOUNTER — Other Ambulatory Visit: Payer: Self-pay

## 2020-06-02 DIAGNOSIS — R061 Stridor: Secondary | ICD-10-CM | POA: Diagnosis not present

## 2020-06-02 MED ORDER — LISINOPRIL 20 MG PO TABS
30.0000 mg | ORAL_TABLET | Freq: Every day | ORAL | Status: DC
  Administered 2020-06-03 – 2020-06-04 (×2): 30 mg via ORAL
  Filled 2020-06-02 (×2): qty 1

## 2020-06-02 MED ORDER — LISINOPRIL 20 MG PO TABS
20.0000 mg | ORAL_TABLET | Freq: Once | ORAL | Status: AC
  Administered 2020-06-02: 20 mg via ORAL
  Filled 2020-06-02: qty 1

## 2020-06-02 NOTE — Progress Notes (Signed)
Patient's BP was 174/76 at 1706, MD was notified and responded to continue to monitor the patient. RN will continue to monitor this patient.

## 2020-06-02 NOTE — Progress Notes (Signed)
PROGRESS NOTE    Adam PorchMoses M Menendez  ZOX:[id number] DOB: 07-19-1933 DOA: 05/25/2020 PCP: Patient, No Pcp Per (Inactive)   Chief Complaint  Patient presents with  . Respiratory Distress  Brief Narrative:  87 year old male with dementia, hypertension, BPH, generalized muscle weakness/deconditioning, PAF on Xarelto, hypertension, hyperlipidemia, morbid obesity, brought to the ED from [facility] on 4/4 for respiratory distress.  Recently admitted 2/24-3/4 MRSA bacteremia and was seen by ID, EP, he is a poor surgical candidate and EP did not recommend removal of pacemaker and recommended oral suppressive therapy, ID advised 6 weeks of vancomycin followed by oral suppressive therapy with doxycycline lifelong.  [facility]. In the ED patient was febrile 101.3, with leukocytosis, lactic acidosis chest x-ray with mild bibasilar subsegmental atelectasis or possible pulmonary edema, started on broad-spectrum antibiotics vancomycin cefepime Flagyl due to concern for pneumonia. Given stridor distress patient was consulted by ENT and pulmonary CCM team-?Stridor likely from tracheobronchial narrowing.  Patient will start on diuresis with disease respiratory status is improved being treated for pleural effusion, acute diastolic CHF. He has been confused and more lethargic for few days ABG and lab work TSH ammonia level done and were unremarkable but  4/7 was more alert/mildly agitated. 4/9-poa at bedside, discussed.  Mentation appear to be at baseline. Palliative care meeting was completed MOST form completed and changed to DNR 4/10.  Subjective: Seen and examined this morning.  Patient is alert awake confused Nasal cannula had migrated I put him back on nose, doing well on 2 L nasal cannula. Blood pressure has been running high 160s to 180s.  Afebrile.    Assessme.nt & Plan:  Stridor/acute respiratory distress and failure with hypoxia/Tracheobronchomalacia:CT chest  and soft tissue neck no abnormality in the neck or subglottis, ENT failed no impending airway compromise,ENT was not able to evaluate the true vocal cords but no acite intervention needed-seen by pulmonary-felt that it is related to tracheobronchomalacia upper airway which could be function of aging and dementia related to loss of muscle tone and likely acute on chronic diastolic dysfunction as evidenced by pleural effusion.  Respiratory status stable on Lasix continue p.o. Lasix.  Continue supplemental oxygen.   MRSA bacteremia on last admission 2/24-3/4 in the setting of PM/PM in place- not a candidate for PM removal as per cardiology:Patient has been on vancomycin completed 6 therapy 06/01/2018 now on oral doxycycline lifelong suppressive therapy-continue per pharmacy.Blood culture no growth this admission 4/4.   Hypokalemia/hypomagnesemia repleted and resolved.  Monitor intermittently.  Anemia likely from chronic disease.  Hemoglobin is stable Recent Labs  Lab 05/27/20 0329 05/28/20 0419 05/29/20 0415 05/30/20 0222 05/31/20 0309  HGB 10.1* 10.2* 10.7* 10.6* 10.9*  HCT 32.1* 32.1* 34.1* 33.8* 34.6*   Pleural effusion bilateral, suspecting diastolic CHF, acute:recent echo 2/24 EF 60 to 65%, off HCTZ stopped and placed on IV Lasix.Repeat CXR-showed congestion 05/27/20.continue oral Lasix, outpatient chest x-ray follow-up as needed. Filed Weights   05/25/20 1018 05/31/20 1957  Weight: 108.1 kg 107.7 kg  Net IO Since Admission: 1,785.21 mL [06/02/20 0809] Recent Labs  Lab 05/27/20 0329 05/28/20 0419 05/29/20 0415 05/30/20 0222 05/31/20 0309  BUN 20 18 17 16 16   CREATININE 0.95 0.93 0.89 0.78 0.82   Acute encephalopathy suspect metabolic in the background of dementia. Dementia: Patient has been lethargic for few days but it has resolved.  He is alert awake oriented x1.  Work-up with normal ABG TSH, and B12 level.  Overall he  is alert awake oriented to self.  Continue delirium precaution  palpitations supportive care.   Essential hypertension: Poorly controlled.  Will order lisinopril 20 mg x 1 today, increase home lisinopril 30 mg which he takes at bedtime, start from tomorrow and increase dose. Continue oral Lasix  PAF on Xarelto.  BPH: On Flomax oxybutynin   History of alcohol dependence no signs symptoms symptoms of withdrawal.  He has been hospitalized recently and has been living in a skilled nursing facility since then.  Morbid obesity BMI 35: Will benefit with PCP follow-up and weight loss   FHQ:RFXJOIT is elderly debilitated multiple comorbidities.  Palliative care consultation appreciated MOST form completed and now changed to DNR.  Overall prognosis does not appear bright, he will likely be high risk of readmissions.  Diet Order            Diet heart healthy/carb modified Room service appropriate? Yes; Fluid consistency: Thin  Diet effective now                 DVT prophylaxis: rivaroxaban (XARELTO) tablet 20 mg Start: 05/25/20 2300 Code Status:   Code Status: DNR  Family Communication: plan of care discussed with patient at bedside. Updated daughter POA previously. Updated patient's POA at the bedside 4/19-at this time waiting on placement and he is medically stable. Discussed in multidisciplinary rounds.  Status is: Inpatient Remains inpatient appropriate because:Ongoing diagnostic testing needed not appropriate for outpatient work up, IV treatments appropriate due to intensity of illness or inability to take PO and Inpatient level of care appropriate due to severity of illness  Dispo: The patient is from: SNF              Anticipated d/c is to: SNF once bed available.  Family refused going back to [facility]              Patient currently is medically stable   Difficult to place patient No  Unresulted Labs (From admission, onward)          Start     Ordered   05/27/20 1707  Vitamin B1  Add-on,   AD       Question:  Specimen collection method   Answer:  IV Team=IV Team collect   05/27/20 1706        Medications reviewed:  Scheduled Meds: . Chlorhexidine Gluconate Cloth  6 each Topical Daily  . doxycycline  100 mg Oral Q12H  . furosemide  20 mg Oral Daily  . lisinopril  20 mg Oral QHS  . magnesium oxide  400 mg Oral Daily  . mouth rinse  15 mL Mouth Rinse BID  . melatonin  3 mg Oral QHS  . oxybutynin  5 mg Oral QHS  . pantoprazole  40 mg Oral QHS  . rivaroxaban  20 mg Oral QHS  . sodium chloride flush  10-40 mL Intracatheter Q12H  . sodium chloride flush  3 mL Intravenous Q12H  . tamsulosin  0.4 mg Oral QPC breakfast  . thiamine  100 mg Oral q AM   Continuous Infusions: . sodium chloride      Consultants:see note  Procedures:see note  Antimicrobials: Anti-infectives (From admission, onward)   Start     Dose/Rate Route Frequency Ordered Stop   06/01/20 1000  doxycycline (VIBRA-TABS) tablet 100 mg        100 mg Oral Every 12 hours 05/29/20 1317     05/31/20 1000  doxycycline (VIBRA-TABS) tablet 100 mg  Status:  Discontinued        100 mg Oral Every 12 hours 05/28/20 1448 05/29/20 1317   05/26/20 1500  metroNIDAZOLE (FLAGYL) IVPB 500 mg  Status:  Discontinued        500 mg 100 mL/hr over 60 Minutes Intravenous Every 8 hours 05/26/20 1413 05/27/20 0842   05/25/20 1930  ceFEPIme (MAXIPIME) 2 g in sodium chloride 0.9 % 100 mL IVPB  Status:  Discontinued        2 g 200 mL/hr over 30 Minutes Intravenous Every 8 hours 05/25/20 1310 05/28/20 0849   05/25/20 1430  vancomycin (VANCOREADY) IVPB 1000 mg/200 mL        1,000 mg 200 mL/hr over 60 Minutes Intravenous Every 12 hours 05/25/20 1309 05/31/20 2359   05/25/20 1115  ceFEPIme (MAXIPIME) 2 g in sodium chloride 0.9 % 100 mL IVPB        2 g 200 mL/hr over 30 Minutes Intravenous  Once 05/25/20 1106 05/25/20 1158     Culture/Microbiology    Component Value Date/Time   SDES BLOOD RIGHT HAND 05/25/2020 1056   SPECREQUEST  05/25/2020 1056    BOTTLES DRAWN AEROBIC AND  ANAEROBIC Blood Culture results may not be optimal due to an inadequate volume of blood received in culture bottles   CULT  05/25/2020 1056    NO GROWTH 5 DAYS Performed at [facility], [address], [place], [place] [address]    REPTSTATUS 05/30/2020 FINAL 05/25/2020 1056    Other culture-see note  Objective: Vitals: Today's Vitals   06/01/20 1703 06/01/20 2059 06/01/20 2228 06/02/20 0513  BP: (!) 160/76 (!) 177/79  (!) 186/75  Pulse: 64 (!) 59  60  Resp: 17 19  18   Temp: 97.6 F (36.4 C) (!) 97.5 F (36.4 C)  (!) 97.4 F (36.3 C)  TempSrc: Oral Oral  Oral  SpO2: 99% 100%  94%  Weight:      Height:      PainSc:   Asleep     Intake/Output Summary (Last 24 hours) at 06/02/2020 0809 Last data filed at 06/01/2020 2144 Gross per 24 hour  Intake 543 ml  Output --  Net 543 ml   Filed Weights   05/25/20 1018 05/31/20 1957  Weight: 108.1 kg 107.7 kg   Weight change:   Intake/Output from previous day: 04/11 0701 - 04/12 0700 In: 543 [P.O.:540; I.V.:3] Out: -  Intake/Output this shift: No intake/output data recorded. Filed Weights   05/25/20 1018 05/31/20 1957  Weight: 108.1 kg 107.7 kg    Examination: General exam: AAOx1, obese, on [place], elderly debilitated frail confused.   HEENT:Oral mucosa moist, Ear/Nose WNL grossly, dentition normal. Respiratory system: bilaterally diminishedd,no wheezing or crackles,no use of accessory muscle. Cardiovascular system: S1 & S2 +, No JVD. Gastrointestinal system: Abdomen soft,Obese, NT,ND, BS+ Nervous System:Alert, awake, moving extremities extremities. Extremities: No edema, distal peripheral pulses palpable.  Skin: No rashes,no icterus. MSK: Normal muscle bulk,tone, power.  Data Reviewed: I have personally reviewed following labs and imaging studies CBC: Recent Labs  Lab 05/27/20 0329 05/28/20 0419 05/29/20 0415 05/30/20 0222 05/31/20 0309  WBC 12.4* 12.5* 12.3* 10.7* 11.7*  NEUTROABS 9.2* 9.5* 9.0* 7.5 8.5*   HGB 10.1* 10.2* 10.7* 10.6* 10.9*  HCT 32.1* 32.1* 34.1* 33.8* 34.6*  MCV 90.9 91.7 91.4 92.1 93.0  PLT 178 199 226 215 238   Basic Metabolic Panel: Recent Labs  Lab 05/27/20 0329 05/28/20 0419 05/29/20 0415 05/30/20 0222 05/31/20 0309  NA 142 139  141 140 142  K 3.0* 3.0* 3.2* 3.2* 3.5  CL 112* 108 109 109 109  CO2 24 24 24 26 28   GLUCOSE 116* 114* 116* 113* 113*  BUN 20 18 17 16 16   CREATININE 0.95 0.93 0.89 0.78 0.82  CALCIUM 8.7* 8.4* 8.6* 8.5* 8.8*  MG 1.7 1.6* 2.0 1.8 1.9   GFR: Estimated Creatinine Clearance: 82.4 mL/min (by C-G formula based on SCr of 0.82 mg/dL). Liver Function Tests: No results for input(s): AST, ALT, ALKPHOS, BILITOT, PROT, ALBUMIN in the last 168 hours. No results for input(s): LIPASE, AMYLASE in the last 168 hours. Recent Labs  Lab 05/27/20 2222  AMMONIA 11   Coagulation Profile: No results for input(s): INR, PROTIME in the last 168 hours. Cardiac Enzymes: No results for input(s): CKTOTAL, CKMB, CKMBINDEX, TROPONINI in the last 168 hours. BNP (last 3 results) No results for input(s): PROBNP in the last 8760 hours. HbA1C: No results for input(s): HGBA1C in the last 72 hours. CBG: No results for input(s): GLUCAP in the last 168 hours. Lipid Profile: No results for input(s): CHOL, HDL, LDLCALC, TRIG, CHOLHDL, LDLDIRECT in the last 72 hours. Thyroid Function Tests: No results for input(s): TSH, T4TOTAL, FREET4, T3FREE, THYROIDAB in the last 72 hours. Anemia Panel: No results for input(s): VITAMINB12, FOLATE, FERRITIN, TIBC, IRON, RETICCTPCT in the last 72 hours. Sepsis Labs: Recent Labs  Lab 05/26/20 1750 05/27/20 0329 05/28/20 0419  PROCALCITON 0.30 0.24 0.13    Recent Results (from the past 240 hour(s))  Culture, blood (routine x 2)     Status: None   Collection Time: 05/25/20 10:50 AM   Specimen: BLOOD RIGHT ARM  Result Value Ref Range Status   Specimen Description BLOOD RIGHT ARM  Final   Special Requests   Final     BOTTLES DRAWN AEROBIC AND ANAEROBIC Blood Culture results may not be optimal due to an inadequate volume of blood received in culture bottles   Culture   Final    NO GROWTH 5 DAYS Performed at [facility], [address], [place], [address] [place]    Report Status 05/30/2020 FINAL  Final  Culture, blood (routine x 2)     Status: None   Collection Time: 05/25/20 10:56 AM   Specimen: BLOOD RIGHT HAND  Result Value Ref Range Status   Specimen Description BLOOD RIGHT HAND  Final   Special Requests   Final    BOTTLES DRAWN AEROBIC AND ANAEROBIC Blood Culture results may not be optimal due to an inadequate volume of blood received in culture bottles   Culture   Final    NO GROWTH 5 DAYS Performed at [facility], [address], [place], [address] [place]    Report Status 05/30/2020 FINAL  Final  SARS CORONAVIRUS 2 (TAT 6-24 HRS) Nasopharyngeal Nasopharyngeal Swab     Status: None   Collection Time: 05/25/20  9:00 PM   Specimen: Nasopharyngeal Swab  Result Value Ref Range Status   SARS Coronavirus 2 NEGATIVE NEGATIVE Final    Comment: (NOTE) SARS-CoV-2 target nucleic acids are NOT DETECTED.  The SARS-CoV-2 RNA is generally detectable in upper and lower respiratory specimens during the acute phase of infection. Negative results do not preclude SARS-CoV-2 infection, do not rule out co-infections with other pathogens, and should not be used as the sole basis for treatment or other patient management decisions. Negative results must be combined with clinical observations, patient history, and epidemiological information. The expected result is Negative.  Fact  Sheet for Patients: HairSlick.no  Fact Sheet for Healthcare Providers: quierodirigir.com  This test is not yet approved or cleared by the [place] FDA and  has been authorized for detection and/or diagnosis of SARS-CoV-2 by FDA under an Emergency Use  Authorization (EUA). This EUA will remain  in effect (meaning this test can be used) for the duration of the COVID-19 declaration under Se ction 564(b)(1) of the Act, 21 U.S.C. section 360bbb-3(b)(1), unless the authorization is terminated or revoked sooner.  Performed at [facility], [address], [place], [place] [address]   Respiratory (~20 pathogens) panel by PCR     Status: None   Collection Time: 05/26/20  8:54 AM   Specimen: Nasopharyngeal Swab; Respiratory  Result Value Ref Range Status   Adenovirus NOT DETECTED NOT DETECTED Final   Coronavirus 229E NOT DETECTED NOT DETECTED Final    Comment: (NOTE) The Coronavirus on the Respiratory Panel, DOES NOT test for the novel  Coronavirus (2019 nCoV)    Coronavirus HKU1 NOT DETECTED NOT DETECTED Final   Coronavirus NL63 NOT DETECTED NOT DETECTED Final   Coronavirus OC43 NOT DETECTED NOT DETECTED Final   Metapneumovirus NOT DETECTED NOT DETECTED Final   Rhinovirus / Enterovirus NOT DETECTED NOT DETECTED Final   Influenza A NOT DETECTED NOT DETECTED Final   Influenza B NOT DETECTED NOT DETECTED Final   Parainfluenza Virus 1 NOT DETECTED NOT DETECTED Final   Parainfluenza Virus 2 NOT DETECTED NOT DETECTED Final   Parainfluenza Virus 3 NOT DETECTED NOT DETECTED Final   Parainfluenza Virus 4 NOT DETECTED NOT DETECTED Final   Respiratory Syncytial Virus NOT DETECTED NOT DETECTED Final   Bordetella pertussis NOT DETECTED NOT DETECTED Final   Bordetella Parapertussis NOT DETECTED NOT DETECTED Final   Chlamydophila pneumoniae NOT DETECTED NOT DETECTED Final   Mycoplasma pneumoniae NOT DETECTED NOT DETECTED Final    Comment: Performed at [facility], [address] [place], [place] [address]     Radiology Studies: No results found.   LOS: 8 days   Lanae Boast, MD [facility]  06/02/2020, 8:09 AM

## 2020-06-03 DIAGNOSIS — R061 Stridor: Secondary | ICD-10-CM | POA: Diagnosis not present

## 2020-06-03 LAB — BASIC METABOLIC PANEL
Anion gap: 7 (ref 5–15)
BUN: 15 mg/dL (ref 8–23)
CO2: 30 mmol/L (ref 22–32)
Calcium: 8.9 mg/dL (ref 8.9–10.3)
Chloride: 105 mmol/L (ref 98–111)
Creatinine, Ser: 0.83 mg/dL (ref 0.61–1.24)
GFR, Estimated: >60 mL/min (ref >60)
Glucose, Bld: 103 mg/dL — ABNORMAL HIGH (ref 70–99)
Potassium: 3 mmol/L — ABNORMAL LOW (ref 3.5–5.1)
Sodium: 142 mmol/L (ref 135–145)

## 2020-06-03 LAB — CBC
HCT: 35.9 % — ABNORMAL LOW (ref 39.0–52.0)
Hemoglobin: 11.4 g/dL — ABNORMAL LOW (ref 13.0–17.0)
MCH: 29.1 pg (ref 26.0–34.0)
MCHC: 31.8 g/dL (ref 30.0–36.0)
MCV: 91.6 fL (ref 80.0–100.0)
Platelets: 275 10*3/uL (ref 150–400)
RBC: 3.92 MIL/uL — ABNORMAL LOW (ref 4.22–5.81)
RDW: 13.4 % (ref 11.5–15.5)
WBC: 9.3 10*3/uL (ref 4.0–10.5)
nRBC: 0 % (ref 0.0–0.2)

## 2020-06-03 MED ORDER — POTASSIUM CHLORIDE CRYS ER 20 MEQ PO TBCR
40.0000 meq | EXTENDED_RELEASE_TABLET | Freq: Once | ORAL | Status: AC
  Administered 2020-06-03: 40 meq via ORAL
  Filled 2020-06-03: qty 2

## 2020-06-03 NOTE — Progress Notes (Signed)
PROGRESS NOTE    OTHEL HOOGENDOORN  RUE:[id number] DOB: [birth year] DOA: 05/25/2020 PCP: Patient, No Pcp Per (Inactive)   Chief Complaint  Patient presents with  . Respiratory Distress  Brief Narrative:  87 year old male with dementia, hypertension, BPH, generalized muscle weakness/deconditioning, PAF on Xarelto, hypertension, hyperlipidemia, morbid obesity, brought to the ED from [facility] on 4/4 for respiratory distress.  Recently admitted 2/24-3/4 MRSA bacteremia and was seen by ID, EP, he is a poor surgical candidate and EP did not recommend removal of pacemaker and recommended oral suppressive therapy, ID advised 6 weeks of vancomycin followed by oral suppressive therapy with doxycycline lifelong.  He was discharged to [facility] a skilled nursing facility. In the ED patient was febrile 101.3, with leukocytosis, lactic acidosis chest x-ray with mild bibasilar subsegmental atelectasis or possible pulmonary edema, started on broad-spectrum antibiotics vancomycin cefepime Flagyl due to concern for pneumonia. Given stridor distress patient was consulted by ENT and pulmonary CCM team-?Stridor likely from tracheobronchial narrowing.  Patient will start on diuresis with disease respiratory status is improved being treated for pleural effusion, acute diastolic CHF. He has been confused and more lethargic for few days ABG and lab work TSH ammonia level done and were unremarkable but  4/7 was more alert/mildly agitated. 4/9-poa at bedside, discussed.  Mentation appear to be at baseline. Palliative care meeting was completed MOST form completed and changed to DNR 4/10.  Subjective: Seen this morning.  Resting.  Did wake up for me and able to tell me his name, when prompted he said he is in the hospital.  No other new complaints.  Remains confused with dementia.   On 2 L nasal cannula. Assessme.nt & Plan:  Stridor/acute respiratory distress and failure with hypoxia/Tracheobronchomalacia:CT chest and soft  tissue neck no abnormality in the neck or subglottis, ENT and pulmonary input appreciated suspecting tracheobronchomalacia upper airway- which could be function of aging and dementia related to loss of muscle tone and likely acute on chronic diastolic dysfunction as evidenced by pleural effusion.  Significantly improved with diuresis, continue oral Lasix, continue supplemental oxygen supportive care  MRSA bacteremia on last admission 2/24-3/4 in the setting of PM/PM in place- not a candidate for PM removal as per cardiology:Patient has been on vancomycin completed 6 week therapy 06/01/2018 now on oral doxycycline lifelong suppressive therapy-continue per pharmacy.Blood culture no growth this admission 4/4.   Hypokalemia/hypomagnesemia potassium 3.0 this morning replacement ordered again.    Anemia likely from chronic disease.  Hemoglobin is stable Recent Labs  Lab 05/28/20 0419 05/29/20 0415 05/30/20 0222 05/31/20 0309 06/03/20 0315  HGB 10.2* 10.7* 10.6* 10.9* 11.4*  HCT 32.1* 34.1* 33.8* 34.6* 35.9*   Pleural effusion bilateral, suspecting diastolic CHF, acute:recent echo 2/24 EF 60 to 65%, off HCTZ stopped and treated with IV Lasix.  Respiratory status overall stable.  Continue oral Lasix.  Total net balance and wt as below [facility] Weights   05/25/20 1018 05/31/20 1957  Weight: 108.1 kg 107.7 kg  Net IO Since Admission: 1,870.21 mL [06/03/20 1326] Recent Labs  Lab 05/28/20 0419 05/29/20 0415 05/30/20 0222 05/31/20 0309 06/03/20 0315  BUN 18 17 16 16 15   CREATININE 0.93 0.89 0.78 0.82 0.83   Acute encephalopathy suspect metabolic in the background of dementia. Dementia: Patient has been lethargic previously currently alert awake oriented times to self.  Continue supportive care, delirium precautions, fall precaution. He had normal ABG TSH, and B12 level.    Essential hypertension: Uncontrolled yesterday, lisinopril dose increased.  Continue  on 30 mg daily bedtime, continue Lasix and  monitor.   PAF on Xarelto.  BPH: On Flomax oxybutynin   History of alcohol dependence no signs symptoms symptoms of withdrawal.  He has been hospitalized recently and has been living in a skilled nursing facility since then.  Morbid obesity BMI 35: Will benefit with PCP follow-up and weight loss   ZOX:WRUEAVW is elderly debilitated multiple comorbidities.  Palliative care consultation appreciated MOST form completed and now changed to DNR.  Overall prognosis does not appear bright, he will likely be high risk of readmissions.  He is waiting for placement at this time  Diet Order            Diet heart healthy/carb modified Room service appropriate? Yes; Fluid consistency: Thin  Diet effective now                 DVT prophylaxis: rivaroxaban (XARELTO) tablet 20 mg Start: 05/25/20 2300 Code Status:   Code Status: DNR  Family Communication: plan of care discussed with patient and had updated patient's daughter at the bedside and over the phone previously.  At this time awaiting on placement otherwise stable   Status is: Inpatient Remains inpatient appropriate because:Ongoing diagnostic testing needed not appropriate for outpatient work up, IV treatments appropriate due to intensity of illness or inability to take PO and Inpatient level of care appropriate due to severity of illness  Dispo: The patient is from: SNF              Anticipated d/c is to: SNF once bed available.  Family refused [facility] to [facility]              Patient currently is medically stable   Difficult to place patient No  Unresulted Labs (From admission, onward)          Start     Ordered   05/27/20 1707  Vitamin B1  Add-on,   AD       Question:  Specimen collection method  Answer:  IV Team=IV Team collect   05/27/20 1706        Medications reviewed:  Scheduled Meds: . Chlorhexidine Gluconate Cloth  6 each Topical Daily  . doxycycline  100 mg Oral Q12H  . furosemide  20 mg Oral Daily  . lisinopril   30 mg Oral QHS  . magnesium oxide  400 mg Oral Daily  . mouth rinse  15 mL Mouth Rinse BID  . melatonin  3 mg Oral QHS  . oxybutynin  5 mg Oral QHS  . pantoprazole  40 mg Oral QHS  . rivaroxaban  20 mg Oral QHS  . sodium chloride flush  10-40 mL Intracatheter Q12H  . sodium chloride flush  3 mL Intravenous Q12H  . tamsulosin  0.4 mg Oral QPC breakfast  . thiamine  100 mg Oral q AM   Continuous Infusions: . sodium chloride      Consultants:see note  Procedures:see note  Antimicrobials: Anti-infectives (From admission, onward)   Start     Dose/Rate Route Frequency Ordered Stop   06/01/20 1000  doxycycline (VIBRA-TABS) tablet 100 mg        100 mg Oral Every 12 hours 05/29/20 1317     05/31/20 1000  doxycycline (VIBRA-TABS) tablet 100 mg  Status:  Discontinued        100 mg Oral Every 12 hours 05/28/20 1448 05/29/20 1317   05/26/20 1500  metroNIDAZOLE (FLAGYL) IVPB 500 mg  Status:  Discontinued  500 mg 100 mL/hr over 60 Minutes Intravenous Every 8 hours 05/26/20 1413 05/27/20 0842   05/25/20 1930  ceFEPIme (MAXIPIME) 2 g in sodium chloride 0.9 % 100 mL IVPB  Status:  Discontinued        2 g 200 mL/hr over 30 Minutes Intravenous Every 8 hours 05/25/20 1310 05/28/20 0849   05/25/20 1430  vancomycin (VANCOREADY) IVPB 1000 mg/200 mL        1,000 mg 200 mL/hr over 60 Minutes Intravenous Every 12 hours 05/25/20 1309 05/31/20 2359   05/25/20 1115  ceFEPIme (MAXIPIME) 2 g in sodium chloride 0.9 % 100 mL IVPB        2 g 200 mL/hr over 30 Minutes Intravenous  Once 05/25/20 1106 05/25/20 1158     Culture/Microbiology    Component Value Date/Time   SDES BLOOD RIGHT HAND 05/25/2020 1056   SPECREQUEST  05/25/2020 1056    BOTTLES DRAWN AEROBIC AND ANAEROBIC Blood Culture results may not be optimal due to an inadequate volume of blood received in culture bottles   CULT  05/25/2020 1056    NO GROWTH 5 DAYS Performed at [facility], [address] [place], [place] [address]     REPTSTATUS 05/30/2020 FINAL 05/25/2020 1056    Other culture-see note  Objective: Vitals: Today's Vitals   06/02/20 2019 06/02/20 2240 06/03/20 0515 06/03/20 0924  BP: (!) 156/83  (!) 143/61 (!) 153/65  Pulse: 61  65 68  Resp: 20  15 18   Temp: 97.7 F (36.5 C)  98.4 F (36.9 C) 98 F (36.7 C)  TempSrc: Oral   Oral  SpO2: 99%  99% 100%  Weight:      Height:      PainSc:  0-No pain      Intake/Output Summary (Last 24 hours) at 06/03/2020 1326 Last data filed at 06/03/2020 0924 Gross per 24 hour  Intake 843 ml  Output 601 ml  Net 242 ml   Filed Weights   05/25/20 1018 05/31/20 1957  Weight: 108.1 kg 107.7 kg   Weight change:   Intake/Output from previous day: 04/12 0701 - 04/13 0700 In: 783 [P.O.:780; I.V.:3] Out: 1001 [Urine:1001] Intake/Output this shift: Total I/O In: 303 [P.O.:300; I.V.:3] Out: Ceasar Mons-  Filed Weights   05/25/20 1018 05/31/20 1957  Weight: 108.1 kg 107.7 kg    Examination: General exam: AAOx1, obese,NAD, weak appearing. HEENT:Oral mucosa moist, Ear/Nose WNL grossly, dentition normal. Respiratory system: bilaterally clear,no wheezing or crackles,no use of accessory muscle Cardiovascular system: S1 & S2 +, No JVD,. Gastrointestinal system: Abdomen soft, NT,ND, BS+ Nervous System:Alert, awake, moving extremities and grossly nonfocal Extremities: No edema, distal peripheral pulses palpable.  Skin: No rashes,no icterus. MSK: Normal muscle bulk,tone, power  Data Reviewed: I have personally reviewed following labs and imaging studies CBC: Recent Labs  Lab 05/28/20 0419 05/29/20 0415 05/30/20 0222 05/31/20 0309 06/03/20 0315  WBC 12.5* 12.3* 10.7* 11.7* 9.3  NEUTROABS 9.5* 9.0* 7.5 8.5*  --   HGB 10.2* 10.7* 10.6* 10.9* 11.4*  HCT 32.1* 34.1* 33.8* 34.6* 35.9*  MCV 91.7 91.4 92.1 93.0 91.6  PLT 199 226 215 238 275   Basic Metabolic Panel: Recent Labs  Lab 05/28/20 0419 05/29/20 0415 05/30/20 0222 05/31/20 0309 06/03/20 0315  NA 139  141 140 142 142  K 3.0* 3.2* 3.2* 3.5 3.0*  CL 108 109 109 109 105  CO2 24 24 26 28 30   GLUCOSE 114* 116* 113* 113* 103*  BUN 18 17 16  16  15  CREATININE 0.93 0.89 0.78 0.82 0.83  CALCIUM 8.4* 8.6* 8.5* 8.8* 8.9  MG 1.6* 2.0 1.8 1.9  --    GFR: Estimated Creatinine Clearance: 81.4 mL/min (by C-G formula based on SCr of 0.83 mg/dL). Liver Function Tests: No results for input(s): AST, ALT, ALKPHOS, BILITOT, PROT, ALBUMIN in the last 168 hours. No results for input(s): LIPASE, AMYLASE in the last 168 hours. Recent Labs  Lab 05/27/20 2222  AMMONIA 11   Coagulation Profile: No results for input(s): INR, PROTIME in the last 168 hours. Cardiac Enzymes: No results for input(s): CKTOTAL, CKMB, CKMBINDEX, TROPONINI in the last 168 hours. BNP (last 3 results) No results for input(s): PROBNP in the last 8760 hours. HbA1C: No results for input(s): HGBA1C in the last 72 hours. CBG: No results for input(s): GLUCAP in the last 168 hours. Lipid Profile: No results for input(s): CHOL, HDL, LDLCALC, TRIG, CHOLHDL, LDLDIRECT in the last 72 hours. Thyroid Function Tests: No results for input(s): TSH, T4TOTAL, FREET4, T3FREE, THYROIDAB in the last 72 hours. Anemia Panel: No results for input(s): VITAMINB12, FOLATE, FERRITIN, TIBC, IRON, RETICCTPCT in the last 72 hours. Sepsis Labs: Recent Labs  Lab 05/28/20 0419  PROCALCITON 0.13    Recent Results (from the past 240 hour(s))  Culture, blood (routine x 2)     Status: None   Collection Time: 05/25/20 10:50 AM   Specimen: BLOOD RIGHT ARM  Result Value Ref Range Status   Specimen Description BLOOD RIGHT ARM  Final   Special Requests   Final    BOTTLES DRAWN AEROBIC AND ANAEROBIC Blood Culture results may not be optimal due to an inadequate volume of blood received in culture bottles   Culture   Final    NO GROWTH 5 DAYS Performed at [facility], [address] [place], [place] [address]    Report Status 05/30/2020 FINAL  Final   Culture, blood (routine x 2)     Status: None   Collection Time: 05/25/20 10:56 AM   Specimen: BLOOD RIGHT HAND  Result Value Ref Range Status   Specimen Description BLOOD RIGHT HAND  Final   Special Requests   Final    BOTTLES DRAWN AEROBIC AND ANAEROBIC Blood Culture results may not be optimal due to an inadequate volume of blood received in culture bottles   Culture   Final    NO GROWTH 5 DAYS Performed at [facility], [address] [place], [place] [address]    Report Status 05/30/2020 FINAL  Final  SARS CORONAVIRUS 2 (TAT 6-24 HRS) Nasopharyngeal Nasopharyngeal Swab     Status: None   Collection Time: 05/25/20  9:00 PM   Specimen: Nasopharyngeal Swab  Result Value Ref Range Status   SARS Coronavirus 2 NEGATIVE NEGATIVE Final    Comment: (NOTE) SARS-CoV-2 target nucleic acids are NOT DETECTED.  The SARS-CoV-2 RNA is generally detectable in upper and lower respiratory specimens during the acute phase of infection. Negative results do not preclude SARS-CoV-2 infection, do not rule out co-infections with other pathogens, and should not be used as the sole basis for treatment or other patient management decisions. Negative results must be combined with clinical observations, patient history, and epidemiological information. The expected result is Negative.  Fact Sheet for Patients: HairSlick.no  Fact Sheet for Healthcare Providers: quierodirigir.com  This test is not yet approved or cleared by the [place] FDA and  has been authorized for detection and/or diagnosis of SARS-CoV-2 by FDA under an Emergency  Use Authorization (EUA). This EUA will remain  in effect (meaning this test can be used) for the duration of the COVID-19 declaration under Se ction 564(b)(1) of the Act, 21 U.S.C. section 360bbb-3(b)(1), unless the authorization is terminated or revoked sooner.  Performed at [facility], [address], [place], [place] [address]   Respiratory (~20 pathogens) panel by PCR     Status: None   Collection Time: 05/26/20  8:54 AM   Specimen: Nasopharyngeal Swab; Respiratory  Result Value Ref Range Status   Adenovirus NOT DETECTED NOT DETECTED Final   Coronavirus 229E NOT DETECTED NOT DETECTED Final    Comment: (NOTE) The Coronavirus on the Respiratory Panel, DOES NOT test for the novel  Coronavirus (2019 nCoV)    Coronavirus HKU1 NOT DETECTED NOT DETECTED Final   Coronavirus NL63 NOT DETECTED NOT DETECTED Final   Coronavirus OC43 NOT DETECTED NOT DETECTED Final   Metapneumovirus NOT DETECTED NOT DETECTED Final   Rhinovirus / Enterovirus NOT DETECTED NOT DETECTED Final   Influenza A NOT DETECTED NOT DETECTED Final   Influenza B NOT DETECTED NOT DETECTED Final   Parainfluenza Virus 1 NOT DETECTED NOT DETECTED Final   Parainfluenza Virus 2 NOT DETECTED NOT DETECTED Final   Parainfluenza Virus 3 NOT DETECTED NOT DETECTED Final   Parainfluenza Virus 4 NOT DETECTED NOT DETECTED Final   Respiratory Syncytial Virus NOT DETECTED NOT DETECTED Final   Bordetella pertussis NOT DETECTED NOT DETECTED Final   Bordetella Parapertussis NOT DETECTED NOT DETECTED Final   Chlamydophila pneumoniae NOT DETECTED NOT DETECTED Final   Mycoplasma pneumoniae NOT DETECTED NOT DETECTED Final    Comment: Performed at [facility], [address] [place], [place] [address]     Radiology Studies: No results found.   LOS: 9 days   Lanae Boast, MD [facility]  06/03/2020, 1:26 PM

## 2020-06-03 NOTE — TOC Progression Note (Signed)
Transition of Care [facility]) - Progression Note    Patient Details  Name: Adam Conner MRN: [id number] Date of Birth: [birth year]  Transition of Care [facility] CM/SW Contact  Okey Dupre Lazaro Arms, LCSW Phone Number: 06/03/2020, 7:21 PM  Clinical Narrative:  CSW received call from Katie with [facility] [phone]) (10:50 am) regarding patient coming to their facility. Per Florentina Addison, patient is in his co-pay days and will have to pay $50 per day and will follow up with daughter Vista Mink regarding the amount that will have to paid to the facility up front.  Talked again with Katie (11:23 am) regarding patient. She had talked with daughter and she is agreeable and will pay what is needed up front for patient to admit to their facility, pending insurance auth. Per Florentina Addison, daughter will transport and patient can d/c to their facility on Thursday, pending insurance auth. Katie asked about patient being on CPAP and CSW advised that will check the chart regarding this. **Katie requested the d/c summary as early as possible. Her fax number is [phone].  [employer] contacted (1:07 pm)  and they do manage patient. Reference number is [id number] and fax number is [phone].  6:18 pm: Received call from [employer] with approval information: Approved effective 4/14 for 3 days. Plan auth ID is [id number] and Reference #[id number]. Case manager is Adline Peals and fax number for continued stay clinicals is [phone]. Call back number is [phone].  Facility admissions director will be contacted on 4/14 with auth information.      Expected Discharge Plan: Skilled Nursing Facility Barriers to Discharge: Other (comment) Research scientist (medical) underway)  Expected Discharge Plan and Services Expected Discharge Plan: Skilled Nursing Facility In-house Referral: Clinical Social Work     Living arrangements for the past 2 months: Single Family Home                                       Social  Determinants of Health (SDOH) Interventions    Readmission Risk Interventions No flowsheet data found.

## 2020-06-03 NOTE — Progress Notes (Signed)
Occupational Therapy Treatment Patient Details Name: Adam Conner MRN: [id number] DOB: Nov 16, 1933 Today's Date: 06/03/2020    History of present illness 87 y.o. male presents to [facility] ED on 4/4 from [facility] with progressive SOB, stridor, and acute BLE edema. Pt with recent admission for osteomyelitis L foot. Chest x-ray was suggestive of some pulmonary edema and mild bibasilar atelectasis. 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   OT comments  Focus of session on self feeding. Pt demonstrating ability to use spoon with L hand to feed himself with min assist and minimal spillage. Left pt with HOB upright at end of session.   Follow Up Recommendations  SNF    Equipment Recommendations  None recommended by OT    Recommendations for Other Services      Precautions / Restrictions Precautions Precautions: Fall Precaution Comments: monitor SpO2       Mobility Bed Mobility Overal bed mobility: Needs Assistance             General bed mobility comments: total assist to pull up in bed for positioning for eating    Transfers                      Balance                                           ADL either performed or assessed with clinical judgement   ADL Overall ADL's : Needs assistance/impaired Eating/Feeding: Minimal assistance;Bed level Eating/Feeding Details (indicate cue type and reason): self fed with spoon in L hand with minimal spillage upright in bed Grooming: Wash/dry hands;Minimal assistance;Bed level Grooming Details (indicate cue type and reason): washed hands prior to eating                                     Vision       Perception     Praxis      Cognition Arousal/Alertness:  Awake/alert Behavior During Therapy: WFL for tasks assessed/performed Overall Cognitive Status: History of cognitive impairments - at baseline                                          Exercises     Shoulder Instructions       General Comments      Pertinent Vitals/ Pain       Pain Assessment: No/denies pain  Home Living                                          Prior Functioning/Environment              Frequency  Min 2X/week        Progress Toward Goals  OT Goals(current goals can now be found in the care plan section)  Progress towards OT goals: Progressing toward goals  Acute Rehab OT Goals Patient Stated Goal: eager to eat OT Goal Formulation: Patient  unable to participate in goal setting Time For Goal Achievement: 06/10/20 Potential to Achieve Goals: Good  Plan Discharge plan remains appropriate    Co-evaluation                 AM-PAC OT "6 Clicks" Daily Activity     Outcome Measure   Help from another person eating meals?: A Little Help from another person taking care of personal grooming?: A Little Help from another person toileting, which includes using toliet, bedpan, or urinal?: Total Help from another person bathing (including washing, rinsing, drying)?: Total Help from another person to put on and taking off regular upper body clothing?: Total Help from another person to put on and taking off regular lower body clothing?: Total 6 Click Score: 10    End of Session Equipment Utilized During Treatment: Oxygen  OT Visit Diagnosis: Cognitive communication deficit (R41.841);Unsteadiness on feet (R26.81)   Activity Tolerance Patient tolerated treatment well   Patient Left in bed;with call bell/phone within reach;with bed alarm set   Nurse Communication          Time: [phone] OT Time Calculation (min): 32 min  Charges: OT General Charges $OT Visit: 1 Visit OT Treatments $Self Care/Home  Management : 23-37 mins  Martie Round, OTR/L Acute Rehabilitation Services Pager: [phone] Office: [phone]   Evern Bio 06/03/2020, 1:18 PM

## 2020-06-04 DIAGNOSIS — R061 Stridor: Secondary | ICD-10-CM | POA: Diagnosis not present

## 2020-06-04 LAB — RESP PANEL BY RT-PCR (FLU A&B, COVID) ARPGX2
Influenza A by PCR: NEGATIVE
Influenza B by PCR: NEGATIVE
SARS Coronavirus 2 by RT PCR: NEGATIVE

## 2020-06-04 MED ORDER — DOXYCYCLINE HYCLATE 100 MG PO TABS: 100.0000 mg | ORAL_TABLET | Freq: Two times a day (BID) | ORAL | Status: DC

## 2020-06-04 MED ORDER — PANTOPRAZOLE SODIUM 40 MG PO TBEC: 40.0000 mg | DELAYED_RELEASE_TABLET | Freq: Every day | ORAL | Status: DC

## 2020-06-04 MED ORDER — IPRATROPIUM-ALBUTEROL 0.5-2.5 (3) MG/3ML IN SOLN: 3.0000 mL | Freq: Four times a day (QID) | RESPIRATORY_TRACT | Status: DC | PRN

## 2020-06-04 MED ORDER — MELATONIN 3 MG PO TABS: 3.0000 mg | ORAL_TABLET | Freq: Every day | ORAL | 0 refills | Status: DC

## 2020-06-04 MED ORDER — FUROSEMIDE 20 MG PO TABS: 20.0000 mg | ORAL_TABLET | Freq: Every day | ORAL | Status: DC

## 2020-06-04 MED ORDER — LISINOPRIL 20 MG PO TABS: 30.0000 mg | ORAL_TABLET | Freq: Every day | ORAL | Status: DC

## 2020-06-04 NOTE — TOC Progression Note (Signed)
Transition of Care [facility]) - Progression Note    Patient Details  Name: Adam Conner MRN: [id number] Date of Birth: 1933-03-13  Transition of Care [facility]) CM/SW Contact  Okey Dupre Lazaro Arms, LCSW Phone Number: 06/04/2020, 6:20 PM  Clinical Narrative: Patient was to discharge to [facility] today. Daughter was working on arranging transport for patient with a company and informed CSW that she and her husband would provide transport to the SNF, however patient's nurse talked with daughter by phone and explained how much assistance patient needs and the equipment they must use to safely move her dad.   [facility] contacted and CSW advised that they were really backed up and it be 10 pm or after before they could get pick-up patient for transport. Call made to [facility] transport and was informed that all of their vendors were booked for the day and could not transport. Talked with Florentina Addison, admissions director and update provided regarding transportation, and she was agreeable to patient coming on Friday. Calls made again to [facility] and [place] and they both reported being totally booked up on Friday and unable to transport patient.  CSW and admissions director Florentina Addison communicated throughout the day and the last call was to inform Florentina Addison that CSW was unable to arrange ambulance transport for Friday.  Florentina Addison indicated that she will have to talk with her leadership as they don't normally admit on Saturday. She will follow-up with CSW on Friday. Daughter contacted, and updated. MD contacted and updated..    Expected Discharge Plan: Skilled Nursing Facility Barriers to Discharge: Barriers Resolved  Expected Discharge Plan and Services Expected Discharge Plan: Skilled Nursing Facility In-house Referral: Clinical Social Work     Living arrangements for the past 2 months: Single Family Home Expected Discharge Date: 06/04/20                                   Social Determinants of  Health (SDOH) Interventions  Working on transportation to get patient to SNF in [place].   Readmission Risk Interventions No flowsheet data found.

## 2020-06-04 NOTE — Progress Notes (Signed)
Wound care completed at 0900- left great toe, applied betadine and allowed to air dry.  Leonia Reeves, RN, BSN

## 2020-06-04 NOTE — TOC Transition Note (Signed)
Transition of Care [facility]) - CM/SW Discharge Note *Discharged to [facility] Phone Number for Report: [phone]   Patient Details  Name: Adam Conner MRN: [id number] Date of Birth: [birth year]  Transition of Care [facility]) CM/SW Contact:  Cristobal Goldmann, LCSW Phone Number: 06/04/2020, 1:59 PM   Clinical Narrative:  Patient medically stable for discharge and going to [facility] in [place]. Insurance authorization received on 4/13. Contact made today with Florentina Addison, admissions director at [facility] and discharge clinicals faxed.   Talked with daughter by phone regarding patient's discharge and amount that patient/family would have to pay for PTAR to transport.  Daughter is working with her husband on arranging transport and admissions Interior and spatial designer informed. Katie with [facility] informed.   Final next level of care: [facility]) Barriers to Discharge: Barriers Resolved   Patient Goals and CMS Choice Patient states their goals for this hospitalization and ongoing recovery are:: Family wants patient to get stronger before returnoing home CMS Medicare.gov Compare Post Acute Care list provided to:: Patient Represenative (must comment) (Daughter Charrise at bedside) Choice offered to / list presented to : Adult Children  Discharge Placement   Existing PASRR number confirmed : 05/29/20          Patient chooses bed at: Other - please specify in the comment section below: [facility]) Patient to be transferred to facility by: Family arranged transport Name of family member notified: Daughter Donnajean Lopes - [phone] Patient and family notified of of transfer: 06/04/20  Discharge Plan and Services In-house Referral: Clinical Social Work                                   Social Determinants of Health (SDOH) Interventions     Readmission Risk Interventions No flowsheet data found.

## 2020-06-04 NOTE — Discharge Summary (Signed)
Physician Discharge Summary  LORENCE NAGENGAST VHQ:[id number] DOB: 07/22/1933 DOA: 05/25/2020  PCP: Patient, No Pcp Per (Inactive)  Admit date: 05/25/2020 Discharge date: 06/04/2020  Admitted From: SNF Disposition:  SNF  Recommendations for Outpatient Follow-up:  1. Follow up with PCP in 1-2 weeks 2. Please obtain BMP/CBC in one week 3. Please follow up on the following pending results:  Home Health:NO  Equipment/Devices: NONE  Discharge Condition: Stable Code Status:   Code Status: DNR Diet recommendation:  Diet Order            Diet - low sodium heart healthy           Diet heart healthy/carb modified Room service appropriate? Yes; Fluid consistency: Thin  Diet effective now                  Brief/Interim Summary:87 year old male with dementia, hypertension, BPH, generalized muscle weakness/deconditioning, PAF on Xarelto, hypertension, hyperlipidemia, morbid obesity, brought to the ED from [facility] on 4/4 for respiratory distress.  Recently admitted 2/24-3/4 MRSA bacteremia and was seen by ID, EP, he is a poor surgical candidate and EP did not recommend removal of pacemaker and recommended oral suppressive therapy, ID advised 6 weeks of vancomycin followed by oral suppressive therapy with doxycycline lifelong.  He was discharged to [facility] a skilled nursing facility. In the ED patient was febrile 101.3, with leukocytosis, lactic acidosis chest x-ray with mild bibasilar subsegmental atelectasis or possible pulmonary edema, started on broad-spectrum antibiotics vancomycin cefepime Flagyl due to concern for pneumonia. Given stridor distress patient was consulted by ENT and pulmonary CCM team-?Stridor likely from tracheobronchial narrowing.  Patient will start on diuresis with disease respiratory status is improved being treated for pleural effusion, acute diastolic CHF. He has been confused and more lethargic for few days ABG and lab work TSH ammonia level done and were unremarkable  but  4/7 was more alert/mildly agitated. 4/9-poa at bedside, discussed.  Mentation appear to be at baseline. Palliative care meeting was completed MOST form completed and changed to DNR 4/10 At this time patient is medically stable alert and oriented x1 due to respiratory status stable doing well on 2 L of cannula. Family wanting him to go to SNF in [place] and finally has a bed and insurance approval today and is being discharged today   Discharge Diagnoses:  Stridor/acute respiratory distress and failure with hypoxia/Tracheobronchomalacia:CT chest and soft tissue neck no abnormality in the neck or subglottis, ENT and pulmonary input appreciated suspecting tracheobronchomalacia upper airway- which could be function of aging and dementia related to loss of muscle tone and likely acute on chronic diastolic dysfunction as evidenced by pleural effusion.  Significantly improved with diuresis, continue oral Lasix, continue supplemental oxygen supportive care  MRSA bacteremia on last admission 2/24-3/4 in the setting of PM/PM in place- not a candidate for PM removal as per cardiology:Patient has been on vancomycin completed 6 week therapy 06/01/2018 now on oral doxycycline lifelong suppressive therapy-continue per pharmacy.Blood culture no growth this admission 4/4.   Hypokalemia/hypomagnesemia repleted.  Anemia likely from chronic disease.  Hemoglobin is stable Recent Labs  Lab 05/29/20 0415 05/30/20 0222 05/31/20 0309 06/03/20 0315  HGB 10.7* 10.6* 10.9* 11.4*  HCT 34.1* 33.8* 34.6* 35.9*   Pleural effusion bilateral, suspecting diastolic CHF, acute:recent echo 2/24 EF 60 to 65%, off HCTZ stopped and treated with IV Lasix.  Respiratory status overall stable.  Continue oral Lasix.  Total net balance and wt as below [employer]   05/25/20  1018 05/31/20 1957  Weight: 108.1 kg 107.7 kg  Net IO Since Admission: 1,950.21 mL [06/04/20 0952] Recent Labs  Lab 05/29/20 0415 05/30/20 0222  05/31/20 0309 06/03/20 0315  BUN 17 16 16 15   CREATININE 0.89 0.78 0.82 0.83   Acute encephalopathy suspect metabolic in the background of dementia. Dementia: Patient has been lethargic previously currently alert awake oriented times to self.  Continue supportive care, delirium precautions, fall precaution. He had normal ABG TSH, and B12 level.    Essential hypertension: Uncontrolled yesterday, lisinopril dose increased.  Continue on 30 mg daily bedtime, continue Lasix and monitor.   PAF on Xarelto.  BPH: On Flomax oxybutynin   History of alcohol dependence no signs symptoms symptoms of withdrawal.  He has been hospitalized recently and has been living in a skilled nursing facility since then.  Morbid obesity BMI 35: Will benefit with PCP follow-up and weight loss   MVH:QIONGEXGOC:patient is elderly debilitated multiple comorbidities.  Palliative care consultation appreciated MOST form completed and now changed to DNR.  Overall prognosis does not appear bright, he will likely be high risk of readmissions.  He is waiting for placement at this time  Consults:  ID  Subjective: Aaox2, not in distress, resting on 2l [place]  Discharge Exam: Vitals:   06/04/20 0519 06/04/20 0919  BP: (!) 151/67 (!) 144/64  Pulse: (!) 59 61  Resp: 16 18  Temp: 98.4 F (36.9 C) 98.2 F (36.8 C)  SpO2: 100% 98%   General: Pt is alert, awake, not in acute distress Cardiovascular: RRR, S1/S2 +, no rubs, no gallops Respiratory: CTA bilaterally, no wheezing, no rhonchi Abdominal: Soft, NT, ND, bowel sounds + Extremities: no edema, no cyanosis  Discharge Instructions  Discharge Instructions    Diet - low sodium heart healthy   Complete by: As directed    Discharge instructions   Complete by: As directed    Continue on doxycycline lifelong therapy.  Check CBC BMP in 5 to 7 days.  Please call call MD or return to ER for similar or worsening recurring problem that brought you to hospital or if any  fever,nausea/vomiting,abdominal pain, uncontrolled pain, chest pain,  shortness of breath or any other alarming symptoms.  Please follow-up your doctor as instructed in a week time and call the office for appointment.  Please avoid alcohol, smoking, or any other illicit substance and maintain healthy habits including taking your regular medications as prescribed.  You were cared for by a hospitalist during your hospital stay. If you have any questions about your discharge medications or the care you received while you were in the hospital after you are discharged, you can call the unit and ask to speak with the hospitalist on call if the hospitalist that took care of you is not available.  Once you are discharged, your primary care physician will handle any further medical issues. Please note that NO REFILLS for any discharge medications will be authorized once you are discharged, as it is imperative that you return to your primary care physician (or establish a relationship with a primary care physician if you do not have one) for your aftercare needs so that they can reassess your need for medications and monitor your lab values   Discharge wound care:   Complete by: As directed    Apply iodine from the swabsticks or swab pads from clean utility to the dark spot on the left great toe.  Allow to air dry   Increase activity slowly  Complete by: As directed      Allergies as of 06/04/2020   No Known Allergies     Medication List    STOP taking these medications   Docusate Sodium 100 MG capsule   hydrochlorothiazide 12.5 MG tablet Commonly known as: HYDRODIURIL   SENIOR VITES PO   vancomycin HCl 1000 MG/200ML Soln Commonly known as: VANCOREADY     TAKE these medications   doxycycline 100 MG tablet Commonly known as: VIBRA-TABS Take 1 tablet (100 mg total) by mouth every 12 (twelve) hours. Indefinite life long therapy   folic acid 1 MG tablet Commonly known as: FOLVITE Take 1 mg  by mouth in the morning.   furosemide 20 MG tablet Commonly known as: LASIX Take 1 tablet (20 mg total) by mouth daily.   ipratropium-albuterol 0.5-2.5 (3) MG/3ML Soln Commonly known as: DUONEB Take 3 mLs by nebulization every 6 (six) hours as needed.   lisinopril 20 MG tablet Commonly known as: ZESTRIL Take 1.5 tablets (30 mg total) by mouth at bedtime. What changed: how much to take   magnesium oxide 400 MG tablet Commonly known as: MAG-OX Take 400 mg by mouth daily.   melatonin 3 MG Tabs tablet Take 1 tablet (3 mg total) by mouth at bedtime.   oxybutynin 5 MG 24 hr tablet Commonly known as: DITROPAN-XL Take 5 mg by mouth at bedtime.   pantoprazole 40 MG tablet Commonly known as: PROTONIX Take 1 tablet (40 mg total) by mouth at bedtime.   Probiotic Acidophilus Caps Take 1 capsule by mouth daily.   rivaroxaban 20 MG Tabs tablet Commonly known as: XARELTO Take 20 mg by mouth at bedtime.   tamsulosin 0.4 MG Caps capsule Commonly known as: FLOMAX Take 0.4 mg by mouth daily after breakfast.   thiamine 100 MG tablet Take 100 mg by mouth in the morning.   Vitamin D-3 25 MCG (1000 UT) Caps Take 1,000 Units by mouth in the morning.            Discharge Care Instructions  (From admission, onward)         Start     Ordered   06/04/20 0000  Discharge wound care:       Comments: Apply iodine from the swabsticks or swab pads from clean utility to the dark spot on the left great toe.  Allow to air dry   06/04/20 0951          No Known Allergies  The results of significant diagnostics from this hospitalization (including imaging, microbiology, ancillary and laboratory) are listed below for reference.    Microbiology: Recent Results (from the past 240 hour(s))  Culture, blood (routine x 2)     Status: None   Collection Time: 05/25/20 10:50 AM   Specimen: BLOOD RIGHT ARM  Result Value Ref Range Status   Specimen Description BLOOD RIGHT ARM  Final   Special  Requests   Final    BOTTLES DRAWN AEROBIC AND ANAEROBIC Blood Culture results may not be optimal due to an inadequate volume of blood received in culture bottles   Culture   Final    NO GROWTH 5 DAYS Performed at [facility], [address] [place], [place] [address]    Report Status 05/30/2020 FINAL  Final  Culture, blood (routine x 2)     Status: None   Collection Time: 05/25/20 10:56 AM   Specimen: BLOOD RIGHT HAND  Result Value Ref Range Status   Specimen Description BLOOD  RIGHT HAND  Final   Special Requests   Final    BOTTLES DRAWN AEROBIC AND ANAEROBIC Blood Culture results may not be optimal due to an inadequate volume of blood received in culture bottles   Culture   Final    NO GROWTH 5 DAYS Performed at [facility], [address], [place], [place] [address]    Report Status 05/30/2020 FINAL  Final  SARS CORONAVIRUS 2 (TAT 6-24 HRS) Nasopharyngeal Nasopharyngeal Swab     Status: None   Collection Time: 05/25/20  9:00 PM   Specimen: Nasopharyngeal Swab  Result Value Ref Range Status   SARS Coronavirus 2 NEGATIVE NEGATIVE Final    Comment: (NOTE) SARS-CoV-2 target nucleic acids are NOT DETECTED.  The SARS-CoV-2 RNA is generally detectable in upper and lower respiratory specimens during the acute phase of infection. Negative results do not preclude SARS-CoV-2 infection, do not rule out co-infections with other pathogens, and should not be used as the sole basis for treatment or other patient management decisions. Negative results must be combined with clinical observations, patient history, and epidemiological information. The expected result is Negative.  Fact Sheet for Patients: HairSlick.no  Fact Sheet for Healthcare Providers: quierodirigir.com  This test is not yet approved or cleared by the [place] FDA and  has been authorized for detection and/or diagnosis of SARS-CoV-2 by FDA under an  Emergency Use Authorization (EUA). This EUA will remain  in effect (meaning this test can be used) for the duration of the COVID-19 declaration under Se ction 564(b)(1) of the Act, 21 U.S.C. section 360bbb-3(b)(1), unless the authorization is terminated or revoked sooner.  Performed at [facility], [address] [place], [place] [address]   Respiratory (~20 pathogens) panel by PCR     Status: None   Collection Time: 05/26/20  8:54 AM   Specimen: Nasopharyngeal Swab; Respiratory  Result Value Ref Range Status   Adenovirus NOT DETECTED NOT DETECTED Final   Coronavirus 229E NOT DETECTED NOT DETECTED Final    Comment: (NOTE) The Coronavirus on the Respiratory Panel, DOES NOT test for the novel  Coronavirus (2019 nCoV)    Coronavirus HKU1 NOT DETECTED NOT DETECTED Final   Coronavirus NL63 NOT DETECTED NOT DETECTED Final   Coronavirus OC43 NOT DETECTED NOT DETECTED Final   Metapneumovirus NOT DETECTED NOT DETECTED Final   Rhinovirus / Enterovirus NOT DETECTED NOT DETECTED Final   Influenza A NOT DETECTED NOT DETECTED Final   Influenza B NOT DETECTED NOT DETECTED Final   Parainfluenza Virus 1 NOT DETECTED NOT DETECTED Final   Parainfluenza Virus 2 NOT DETECTED NOT DETECTED Final   Parainfluenza Virus 3 NOT DETECTED NOT DETECTED Final   Parainfluenza Virus 4 NOT DETECTED NOT DETECTED Final   Respiratory Syncytial Virus NOT DETECTED NOT DETECTED Final   Bordetella pertussis NOT DETECTED NOT DETECTED Final   Bordetella Parapertussis NOT DETECTED NOT DETECTED Final   Chlamydophila pneumoniae NOT DETECTED NOT DETECTED Final   Mycoplasma pneumoniae NOT DETECTED NOT DETECTED Final    Comment: Performed at [facility], [address], [place], [place] [address]    Procedures/Studies: CT Soft Tissue Neck W Contrast  Result Date: 05/25/2020 CLINICAL DATA:  Epiglottitis or tonsillitis. Stridor. Leukocytosis. Fever. EXAM: CT NECK WITH CONTRAST TECHNIQUE: Multidetector CT imaging  of the neck was performed using the standard protocol following the bolus administration of intravenous contrast. CONTRAST:  97mL OMNIPAQUE IOHEXOL 300 MG/ML  SOLN COMPARISON:  None. FINDINGS: Pharynx and larynx: No  evidence of mucosal or submucosal mass lesion. No airway compromise seen to explain stridor. Tonsils are normal. Epiglottis is normal. Glottic region is normal. Subglottic trachea appears to be patent. See results of chest study. Salivary glands: Parotid and submandibular glands are normal. Thyroid: Normal Lymph nodes: No enlarged or low-density nodes on either side of the neck. Vascular: Atherosclerotic calcification at both carotid bifurcations, not evaluated in detail using this technique. Limited intracranial: Normal Visualized orbits: Normal Mastoids and visualized paranasal sinuses: Clear Skeleton: Chronic degenerative spondylosis. Some chronic loss of height of the T1 and T3 vertebral bodies. Upper chest: See results of chest CT. Other: None IMPRESSION: 1. No evidence of mucosal or submucosal mass lesion. No tonsillitis or epiglottitis. No airway compromise seen to explain stridor. 2. Atherosclerotic calcification at both carotid bifurcations, not evaluated in detail using this technique. Electronically Signed   By: Paulina Fusi M.D.   On: 05/25/2020 19:15   CT Chest Wo Contrast  Result Date: 05/25/2020 CLINICAL DATA:  Fever.  The facet doses.  Stridor. EXAM: CT CHEST WITHOUT CONTRAST TECHNIQUE: Multidetector CT imaging of the chest was performed following the standard protocol without IV contrast. COMPARISON:  Neck CT same day FINDINGS: Cardiovascular: Cardiomegaly. Pacemaker in place. Extensive coronary artery calcification. Aortic atherosclerotic calcification. Mediastinum/Nodes: No mediastinal or hilar mass or lymphadenopathy. Lungs/Pleura: Bilateral pleural effusions layering dependently. Dependent pulmonary atelectasis. Non dependent lungs are well aerated without evidence of active  process. There is narrowing of the trachea just proximal to the carina and narrowing of the mainstem bronchi consistent with tracheobronchomalacia. Upper Abdomen: Negative Musculoskeletal: Old appearing compression deformities at T1 and T3. No acute thoracic bone finding. IMPRESSION: 1. Bilateral pleural effusions layering dependently. Dependent pulmonary atelectasis. 2. Cardiomegaly. Extensive coronary artery calcification. Pacemaker in place. 3. Narrowing of the trachea just proximal to the carina and narrowing of the mainstem bronchi consistent with tracheobronchomalacia. 4. Old appearing compression deformities at T1 and T3. 5. Aortic atherosclerosis. Aortic Atherosclerosis (ICD10-I70.0). Electronically Signed   By: Paulina Fusi M.D.   On: 05/25/2020 19:21   DG Chest Port 1 View  Result Date: 05/27/2020 CLINICAL DATA:  Progressive shortness of breath EXAM: PORTABLE CHEST 1 VIEW COMPARISON:  05/25/2020 FINDINGS: Right PICC line and left pacer remain in place, unchanged. Cardiomegaly, vascular congestion. Bibasilar atelectasis. Findings similar to prior study. IMPRESSION: Cardiomegaly with vascular congestion. Bibasilar atelectasis. Electronically Signed   By: Charlett Nose M.D.   On: 05/27/2020 18:00   DG Chest Portable 1 View  Result Date: 05/25/2020 CLINICAL DATA:  Shortness of breath. EXAM: PORTABLE CHEST 1 VIEW COMPARISON:  April 23, 2020. FINDINGS: Stable cardiomegaly. Left-sided pacemaker is unchanged in position. Right-sided PICC line is unchanged. No pneumothorax is noted. Mild bibasilar subsegmental atelectasis or possibly pulmonary edema is noted. No significant pleural effusion is noted. Bony thorax is unremarkable. IMPRESSION: Mild bibasilar subsegmental atelectasis or possibly pulmonary edema is noted. Electronically Signed   By: Lupita Raider M.D.   On: 05/25/2020 10:35    Labs: BNP (last 3 results) Recent Labs    04/16/20 2351 05/25/20 1009  BNP 295.0* 361.2*   Basic Metabolic  Panel: Recent Labs  Lab 05/29/20 0415 05/30/20 0222 05/31/20 0309 06/03/20 0315  NA 141 140 142 142  K 3.2* 3.2* 3.5 3.0*  CL 109 109 109 105  CO2 GLUCOSE 116* 113* 113* 103*  BUN CREATININE 0.89 0.78 0.82 0.83  CALCIUM 8.6* 8.5* 8.8* 8.9  MG 2.0  1.8 1.9  --    Liver Function Tests: No results for input(s): AST, ALT, ALKPHOS, BILITOT, PROT, ALBUMIN in the last 168 hours. No results for input(s): LIPASE, AMYLASE in the last 168 hours. No results for input(s): AMMONIA in the last 168 hours. CBC: Recent Labs  Lab 05/29/20 0415 05/30/20 0222 05/31/20 0309 06/03/20 0315  WBC 12.3* 10.7* 11.7* 9.3  NEUTROABS 9.0* 7.5 8.5*  --   HGB 10.7* 10.6* 10.9* 11.4*  HCT 34.1* 33.8* 34.6* 35.9*  MCV 91.4 92.1 93.0 91.6  PLT 226 215 238 275   Cardiac Enzymes: No results for input(s): CKTOTAL, CKMB, CKMBINDEX, TROPONINI in the last 168 hours. BNP: Invalid input(s): POCBNP CBG: No results for input(s): GLUCAP in the last 168 hours. D-Dimer No results for input(s): DDIMER in the last 72 hours. Hgb A1c No results for input(s): HGBA1C in the last 72 hours. Lipid Profile No results for input(s): CHOL, HDL, LDLCALC, TRIG, CHOLHDL, LDLDIRECT in the last 72 hours. Thyroid function studies No results for input(s): TSH, T4TOTAL, T3FREE, THYROIDAB in the last 72 hours.  Invalid input(s): FREET3 Anemia work up No results for input(s): VITAMINB12, FOLATE, FERRITIN, TIBC, IRON, RETICCTPCT in the last 72 hours. Urinalysis    Component Value Date/Time   COLORURINE YELLOW 05/28/2020 0319   APPEARANCEUR CLEAR 05/28/2020 0319   LABSPEC 1.025 05/28/2020 0319   PHURINE 6.0 05/28/2020 0319   GLUCOSEU NEGATIVE 05/28/2020 0319   HGBUR SMALL (A) 05/28/2020 0319   BILIRUBINUR NEGATIVE 05/28/2020 0319   KETONESUR 5 (A) 05/28/2020 0319   PROTEINUR NEGATIVE 05/28/2020 0319   UROBILINOGEN 4.0 (H) 12/15/2008 0600   NITRITE NEGATIVE 05/28/2020 0319   LEUKOCYTESUR NEGATIVE  05/28/2020 0319   Sepsis Labs Invalid input(s): PROCALCITONIN,  WBC,  LACTICIDVEN Microbiology Recent Results (from the past 240 hour(s))  Culture, blood (routine x 2)     Status: None   Collection Time: 05/25/20 10:50 AM   Specimen: BLOOD RIGHT ARM  Result Value Ref Range Status   Specimen Description BLOOD RIGHT ARM  Final   Special Requests   Final    BOTTLES DRAWN AEROBIC AND ANAEROBIC Blood Culture results may not be optimal due to an inadequate volume of blood received in culture bottles   Culture   Final    NO GROWTH 5 DAYS Performed at [facility], [address], [place], [place] [address]    Report Status 05/30/2020 FINAL  Final  Culture, blood (routine x 2)     Status: None   Collection Time: 05/25/20 10:56 AM   Specimen: BLOOD RIGHT HAND  Result Value Ref Range Status   Specimen Description BLOOD RIGHT HAND  Final   Special Requests   Final    BOTTLES DRAWN AEROBIC AND ANAEROBIC Blood Culture results may not be optimal due to an inadequate volume of blood received in culture bottles   Culture   Final    NO GROWTH 5 DAYS Performed at [facility], [address] [place], [place] [address]    Report Status 05/30/2020 FINAL  Final  SARS CORONAVIRUS 2 (TAT 6-24 HRS) Nasopharyngeal Nasopharyngeal Swab     Status: None   Collection Time: 05/25/20  9:00 PM   Specimen: Nasopharyngeal Swab  Result Value Ref Range Status   SARS Coronavirus 2 NEGATIVE NEGATIVE Final    Comment: (NOTE) SARS-CoV-2 target nucleic acids are NOT DETECTED.  The SARS-CoV-2 RNA is generally detectable in upper and lower respiratory specimens during the acute phase of infection. Negative  results do not preclude SARS-CoV-2 infection, do not rule out co-infections with other pathogens, and should not be used as the sole basis for treatment or other patient management decisions. Negative results must be combined with clinical observations, patient history, and epidemiological  information. The expected result is Negative.  Fact Sheet for Patients: HairSlick.no  Fact Sheet for Healthcare Providers: quierodirigir.com  This test is not yet approved or cleared by the [place] FDA and  has been authorized for detection and/or diagnosis of SARS-CoV-2 by FDA under an Emergency Use Authorization (EUA). This EUA will remain  in effect (meaning this test can be used) for the duration of the COVID-19 declaration under Se ction 564(b)(1) of the Act, 21 U.S.C. section 360bbb-3(b)(1), unless the authorization is terminated or revoked sooner.  Performed at [facility], [address], [place], [place] [address]   Respiratory (~20 pathogens) panel by PCR     Status: None   Collection Time: 05/26/20  8:54 AM   Specimen: Nasopharyngeal Swab; Respiratory  Result Value Ref Range Status   Adenovirus NOT DETECTED NOT DETECTED Final   Coronavirus 229E NOT DETECTED NOT DETECTED Final    Comment: (NOTE) The Coronavirus on the Respiratory Panel, DOES NOT test for the novel  Coronavirus (2019 nCoV)    Coronavirus HKU1 NOT DETECTED NOT DETECTED Final   Coronavirus NL63 NOT DETECTED NOT DETECTED Final   Coronavirus OC43 NOT DETECTED NOT DETECTED Final   Metapneumovirus NOT DETECTED NOT DETECTED Final   Rhinovirus / Enterovirus NOT DETECTED NOT DETECTED Final   Influenza A NOT DETECTED NOT DETECTED Final   Influenza B NOT DETECTED NOT DETECTED Final   Parainfluenza Virus 1 NOT DETECTED NOT DETECTED Final   Parainfluenza Virus 2 NOT DETECTED NOT DETECTED Final   Parainfluenza Virus 3 NOT DETECTED NOT DETECTED Final   Parainfluenza Virus 4 NOT DETECTED NOT DETECTED Final   Respiratory Syncytial Virus NOT DETECTED NOT DETECTED Final   Bordetella pertussis NOT DETECTED NOT DETECTED Final   Bordetella Parapertussis NOT DETECTED NOT DETECTED Final   Chlamydophila pneumoniae NOT DETECTED NOT DETECTED Final    Mycoplasma pneumoniae NOT DETECTED NOT DETECTED Final    Comment: Performed at [facility], [address] [place], [place] [address]     Time coordinating discharge: 35 minutes  SIGNED: Lanae Boast, MD  [facility] 06/04/2020, 9:52 AM  If 7PM-7AM, please contact night-coverage [email or web address]

## 2020-06-04 NOTE — Progress Notes (Signed)
Physical Therapy Treatment Patient Details Name: Adam Conner MRN: [id number] DOB: December 09, 1933 Today's Date: 06/04/2020    History of Present Illness 87 y.o. male presents to [facility] ED on 4/4 from [facility] with progressive SOB, stridor, and acute BLE edema. Pt with recent admission for osteomyelitis L foot. Chest x-ray was suggestive of some pulmonary edema and mild bibasilar atelectasis. 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    PT Comments    Pt agreeable to mobility upon arrival to room. Pt requiring max assist for moving to/from EOB, pt requiring step-by-step cues and benefits from both verbal and tactile cuing. Pt tolerated EOB sitting and core/LE exercises x12 minutes, PT not requiring truncal support in sitting to maintain upright. PT to continue to follow acutely, SNF remains appropriate dispo.  Follow Up Recommendations  SNF     Equipment Recommendations  Wheelchair (measurements PT);Wheelchair cushion (measurements PT);Hospital bed (mechanical lift if D/C home)    Recommendations for Other Services       Precautions / Restrictions Precautions Precautions: Fall Restrictions Weight Bearing Restrictions: No    Mobility  Bed Mobility Overal bed mobility: Needs Assistance Bed Mobility: Supine to Sit;Sit to Supine     Supine to sit: Max assist Sit to supine: Max assist   General bed mobility comments: max assist for supine<>sit for trunk and LE management, pt moving LEs to EOB and assisting in elevating trunk with HHA.    Transfers                 General transfer comment: unable to power up buttocks  Ambulation/Gait                 Stairs             Wheelchair Mobility    Modified Rankin (Stroke Patients Only)        Balance Overall balance assessment: Needs assistance Sitting-balance support: Feet supported;Single extremity supported Sitting balance-Leahy Scale: Fair Sitting balance - Comments: EOB sitting x12 minutes with no physical truncal support, requires SL support when performing LE exercise       Standing balance comment: unable to assess                            Cognition Arousal/Alertness: Awake/alert Behavior During Therapy: WFL for tasks assessed/performed Overall Cognitive Status: History of cognitive impairments - at baseline                                        Exercises General Exercises - Lower Extremity Long Arc Quad: AAROM;Both;15 reps;Seated Hip Flexion/Marching: AAROM;Both;10 reps;Seated Other Exercises Other Exercises: lateral elbow prop to upright sitting, x5 each direction requiring min assist to right posture    General Comments        Pertinent Vitals/Pain Pain Assessment: Faces Faces Pain Scale: Hurts a little bit Pain Location: generalized, LEs Pain Descriptors / Indicators: Grimacing;Moaning;Guarding Pain Intervention(s): Limited activity within patient's tolerance;Monitored during session;Repositioned    Home Living                      Prior Function            PT Goals (  current goals can now be found in the care plan section) Acute Rehab PT Goals Patient Stated Goal: to get out of bed PT Goal Formulation: With patient Time For Goal Achievement: 06/09/20 Potential to Achieve Goals: Fair Progress towards PT goals: Progressing toward goals    Frequency    Min 2X/week      PT Plan Current plan remains appropriate    Co-evaluation              AM-PAC PT "6 Clicks" Mobility   Outcome Measure  Help needed turning from your back to your side while in a flat bed without using bedrails?: A Lot Help needed moving from lying on your back to sitting on the side of a flat bed without using  bedrails?: A Lot Help needed moving to and from a bed to a chair (including a wheelchair)?: Total Help needed standing up from a chair using your arms (e.g., wheelchair or bedside chair)?: Total Help needed to walk in hospital room?: Total Help needed climbing 3-5 steps with a railing? : Total 6 Click Score: 8    End of Session   Activity Tolerance: Patient tolerated treatment well Patient left: in bed;with call bell/phone within reach;with bed alarm set;Other (comment) (prevalon boots donned) Nurse Communication: Mobility status;Need for lift equipment PT Visit Diagnosis: Other abnormalities of gait and mobility (R26.89);Muscle weakness (generalized) (M62.81)     Time: 4196-2229 PT Time Calculation (min) (ACUTE ONLY): 22 min  Charges:  $Therapeutic Activity: 8-22 mins                     Marye Round, PT DPT Acute Rehabilitation Services Pager [phone]  Office [phone]  Rubby Barbary E Christain [place] 06/04/2020, 1:03 PM

## 2020-06-04 NOTE — TOC Progression Note (Addendum)
Transition of Care [facility]) - Progression Note    Patient Details  Name: Adam Conner MRN: [id number] Date of Birth: Jul 18, 1933  Transition of Care [facility]) CM/SW Contact  Okey Dupre Lazaro Arms, LCSW Phone Number: 06/04/2020, 11:19 AM  Clinical Narrative:    06/03/20 - 6:18 pm: Received call from [employer] with approval information: Approved effective 4/14 for 3 days. Plan auth ID is [id number] and Reference #[id number]. Case manager is Adline Peals and fax number for continued stay clinicals is [phone]. Call back number is [phone].  This note faxed to (via Epic) to admissions director at [facility].  Expected Discharge Plan: Skilled Nursing Facility Barriers to Discharge: Other (comment) Research scientist (medical) underway)  Expected Discharge Plan and Services Expected Discharge Plan: Skilled Nursing Facility In-house Referral: Clinical Social Work     Living arrangements for the past 2 months: Single Family Home Expected Discharge Date: 06/04/20                                   Social Determinants of Health (SDOH) Interventions    Readmission Risk Interventions No flowsheet data found.

## 2020-06-05 NOTE — TOC Transition Note (Signed)
Transition of Care [facility]) - CM/SW Discharge Note   Patient Details  Name: ISAC LINCKS MRN: [id number] Date of Birth: 1933-03-06  Transition of Care [facility] CM/SW Contact:  Cristobal Goldmann, LCSW Phone Number: 06/05/2020, 2:42 PM   Clinical Narrative:  Patient discharged to [facility], transported buy a non-emergency transport service recommended by facility and arranged by family. Discharged clinicals were sent to facility on 4/14 and MD's progress note faxed on 4/15. Daughter Blinda Leatherwood was at the bedside today. Nurse provided with information to call report.   Final next level of care: Skilled Nursing Facility [facility]) Barriers to Discharge: Barriers Resolved   Patient Goals and CMS Choice Patient states their goals for this hospitalization and ongoing recovery are:: Family wants patient to get stronger before returning home CMS Medicare.gov Compare Post Acute Care list provided to:: Patient Choice offered to / list presented to : Adult Children  Discharge Placement   Existing PASRR number confirmed : 05/29/20          Patient chooses bed at:  [facility]) Patient to be transferred to facility by: Family arranged transport Name of family member notified: Daughters Danton Sewer - at the bedside on 4/14 and Burwell Bethel at the bedside on 4/15 Patient and family notified of of transfer: 06/04/20  Discharge Plan and Services In-house Referral: Clinical Social Work                                  Social Determinants of Health (SDOH) Interventions  No SDOH interventions requested or needed at discharge.    Readmission Risk Interventions No flowsheet data found.

## 2020-06-05 NOTE — Progress Notes (Signed)
Patient seen and examined this morning he is alert awake oriented x1-2 remains at baseline mental status. Has no new complaints.  Doing well and on 2l  nasal cannula. He was discharged to skilled nursing facility 4/14 but due to some transportation issues yesterday did not leave-hopefully will be able to go to SNF in [place] today.

## 2020-06-05 NOTE — Care Management Important Message (Signed)
Important Message  Patient Details  Name: Adam Conner MRN: [id number] Date of Birth: Sep 18, 1933   Medicare Important Message Given:  Yes - Important Message mailed due to current National Emergency   Verbal consent obtained due to current National Emergency  Relationship to patient: Child Contact Name: Alida Call Date: 06/05/20  Time: 1241 Phone: [phone] Outcome: Spoke with contact Important Message mailed to: Patient address on file    Orson Aloe 06/05/2020, 12:42 PM

## 2020-06-05 NOTE — Progress Notes (Signed)
Occupational Therapy Treatment Patient Details Name: Adam Conner MRN: [id number] DOB: Jun 22, 1933 Today's Date: 06/05/2020    History of present illness 87 y.o. male presents to [facility] ED on 4/4 from [facility] with progressive SOB, stridor, and acute BLE edema. Pt with recent admission for osteomyelitis L foot. Chest x-ray was suggestive of some pulmonary edema and mild bibasilar atelectasis. 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   OT comments  Assisted pt to sit EOB to self feed breakfast. Requiring max assist for bed mobility, but demonstrated fair sitting balance. Much more alert and requesting OT clean his glasses and warm up his coffee. Pt self feeding with set up. Continues to be appropriate for SNF level rehab.   Follow Up Recommendations  SNF    Equipment Recommendations  None recommended by OT    Recommendations for Other Services      Precautions / Restrictions Precautions Precautions: Fall Precaution Comments: monitor SpO2       Mobility Bed Mobility Overal bed mobility: Needs Assistance Bed Mobility: Supine to Sit;Sit to Supine     Supine to sit: Max assist Sit to supine: Max assist   General bed mobility comments: assist for all aspects    Transfers                      Balance Overall balance assessment: Needs assistance Sitting-balance support: Feet supported;Single extremity supported Sitting balance-Leahy Scale: Fair Sitting balance - Comments: min guard while self feeding                                   ADL either performed or assessed with clinical judgement   ADL Overall ADL's : Needs assistance/impaired Eating/Feeding: Set up;Sitting Eating/Feeding Details (indicate cue type and reason): pt able to use B  UEs to self feed, peel banana, sat a EOB Grooming: Wash/dry hands;Wash/dry face;Sitting;Set up Grooming Details (indicate cue type and reason): min guard for sitting balance                                     Vision       Perception     Praxis      Cognition Arousal/Alertness: Awake/alert Behavior During Therapy: WFL for tasks assessed/performed Overall Cognitive Status: History of cognitive impairments - at baseline                                 General Comments: pt asking for glasses to be cleaned and hotter coffee        Exercises     Shoulder Instructions       General Comments      Pertinent Vitals/ Pain       Pain Assessment: Faces Faces Pain Scale: Hurts a little bit Pain Location: generalized, LEs Pain Descriptors / Indicators: Grimacing;Moaning;Guarding Pain Intervention(s): Monitored during session;Repositioned  Home Living  Prior Functioning/Environment              Frequency  Min 2X/week        Progress Toward Goals  OT Goals(current goals can now be found in the care plan section)  Progress towards OT goals: Progressing toward goals  Acute Rehab OT Goals Patient Stated Goal: none stated OT Goal Formulation: Patient unable to participate in goal setting Time For Goal Achievement: 06/10/20 Potential to Achieve Goals: Good  Plan Discharge plan remains appropriate    Co-evaluation                 AM-PAC OT "6 Clicks" Daily Activity     Outcome Measure   Help from another person eating meals?: A Little Help from another person taking care of personal grooming?: A Little Help from another person toileting, which includes using toliet, bedpan, or urinal?: Total Help from another person bathing (including washing, rinsing, drying)?: Total Help from another person to put on and taking off regular upper body clothing?: Total Help from  another person to put on and taking off regular lower body clothing?: Total 6 Click Score: 10    End of Session Equipment Utilized During Treatment: Oxygen (2L)  OT Visit Diagnosis: Cognitive communication deficit (R41.841);Unsteadiness on feet (R26.81)   Activity Tolerance Patient tolerated treatment well   Patient Left in bed;with call bell/phone within reach;with bed alarm set   Nurse Communication Other (comment) (pt more alert than during Wed visit)        Time: 4270-6237 OT Time Calculation (min): 20 min  Charges: OT General Charges $OT Visit: 1 Visit OT Treatments $Self Care/Home Management : 8-22 mins  Martie Round, OTR/L Acute Rehabilitation Services Pager: [phone] Office: [phone]   Evern Bio 06/05/2020, 9:00 AM

## 2020-06-05 NOTE — Progress Notes (Incomplete)
Trying to call report to [facility] in [place].

## 2020-06-05 NOTE — Progress Notes (Incomplete)
DISCHARGE NOTE SNF [facility] to be discharged Skilled nursing facility [facility]la R[facility] per MD order. Patient verbalized understanding.  Skin clean, dry and intact without evidence of skin break down, no evidence of skin tears noted. IV catheter discontinued intact. Site without signs and symptoms of complications. Dressing and pressure applied. Pt denies pain at the site currently. No complaints noted.  Patient free of lines, drains, and wounds.   Discharge packet assembled. An After Visit Summary (AVS) was printed and given to the EMS personnel. Patient escorted via stretcher and discharged to [facility] via ambulance. Report called to accepting facility; all questions and concerns addressed.   Velia Meyer, RN

## 2020-06-08 LAB — VITAMIN B1: Vitamin B1 (Thiamine): 146.4 nmol/L (ref 66.5–200.0)

## 2020-07-01 ENCOUNTER — Inpatient Hospital Stay: Payer: Medicare PPO | Admitting: Infectious Diseases

## 2021-04-28 IMAGING — CT CT CHEST W/O CM
2 of 4 series · 15 of 36 positions shown, 18 images · non-contrast
Comparison: Neck CT same day

CLINICAL DATA: Fever.  The facet doses.  Stridor.

EXAM:
CT CHEST WITHOUT CONTRAST
TECHNIQUE: Multidetector CT imaging of the chest was performed following the
standard protocol without IV contrast.

[Series 3: chest (person_name) · axial · 0.83mm/px · z∈[-510,-228]mm · 12 of 167 slices shown, 15 images]
[im 13/167  mediastinal]
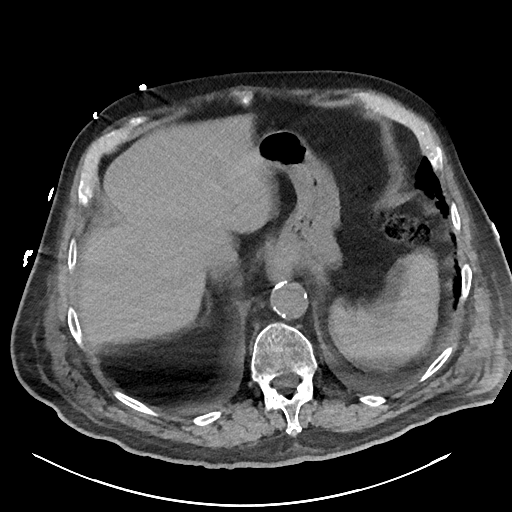
[im 13/167  lung]
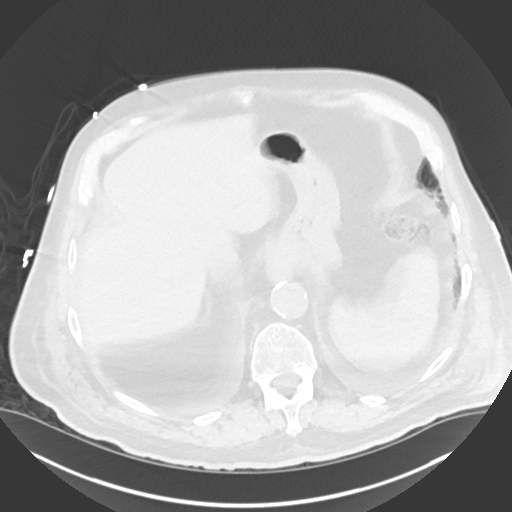
[im 26/167  lung]
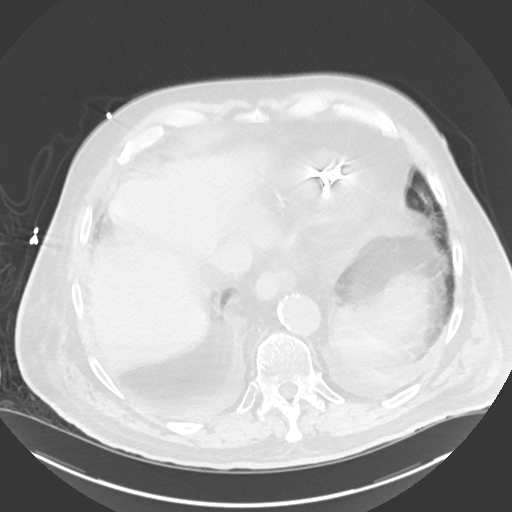
[im 39/167  lung]
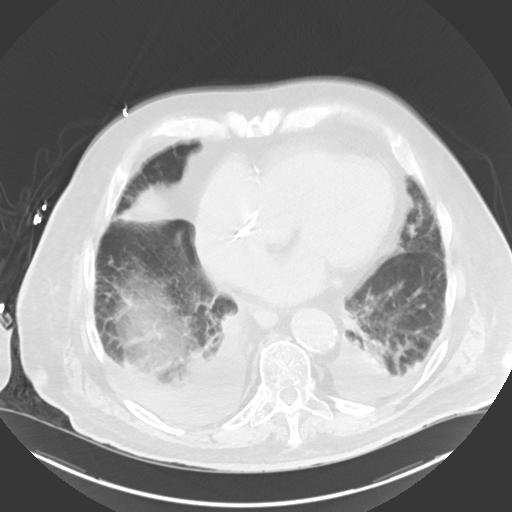
[im 52/167  lung]
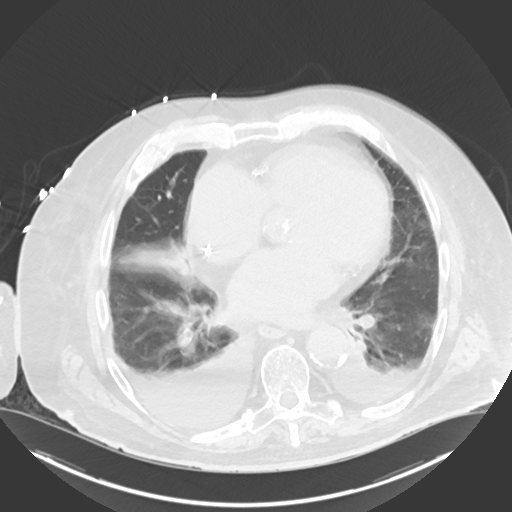
[im 64/167  mediastinal]
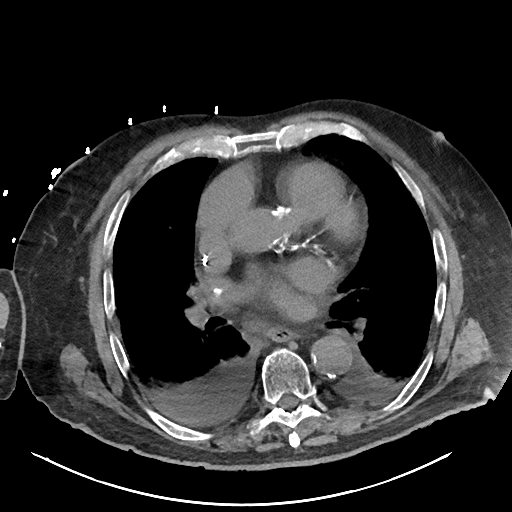
[im 64/167  lung]
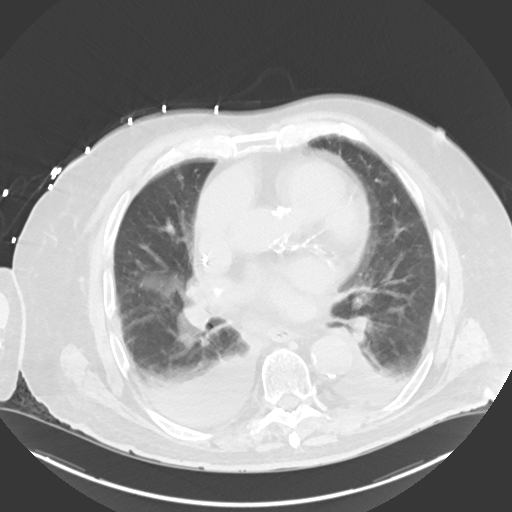
[im 77/167  lung]
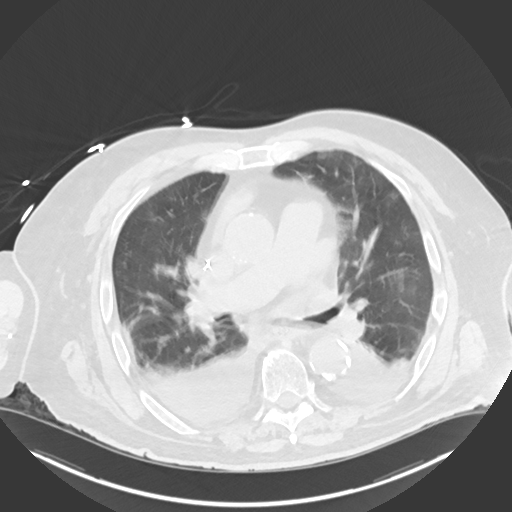
[im 90/167  lung]
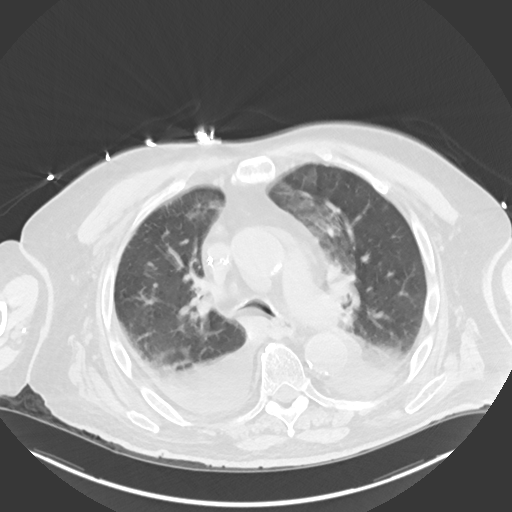
[im 103/167  lung]
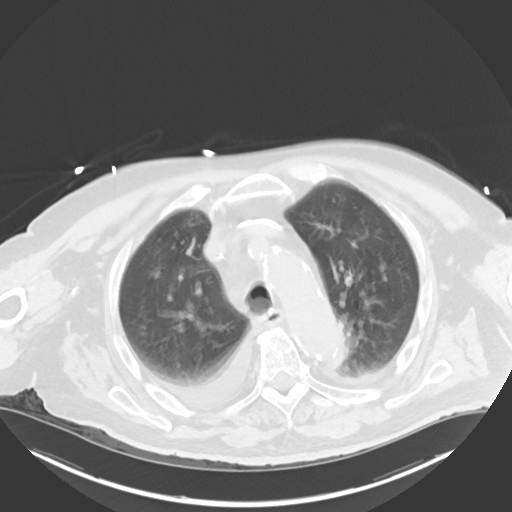
[im 115/167  mediastinal]
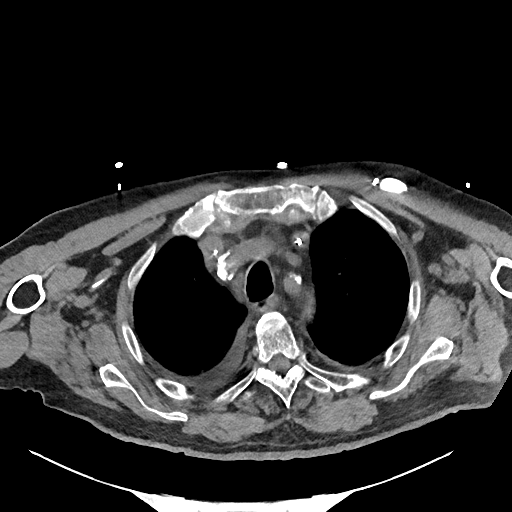
[im 115/167  lung]
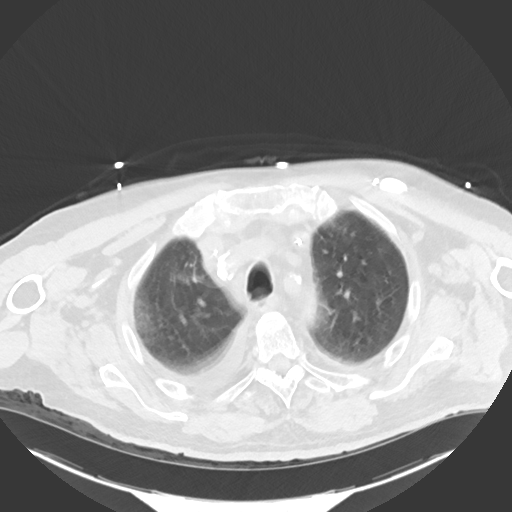
[im 128/167  lung]
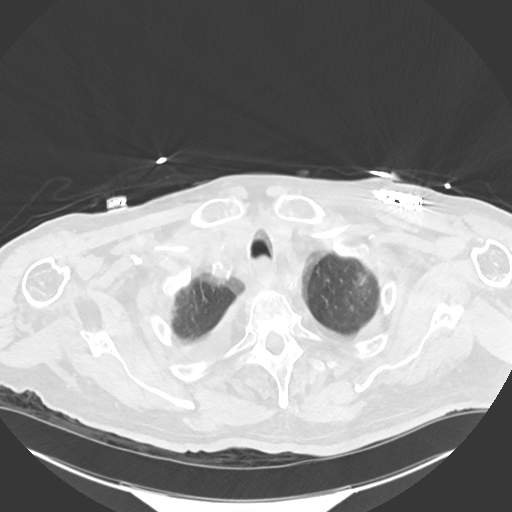
[im 141/167  lung]
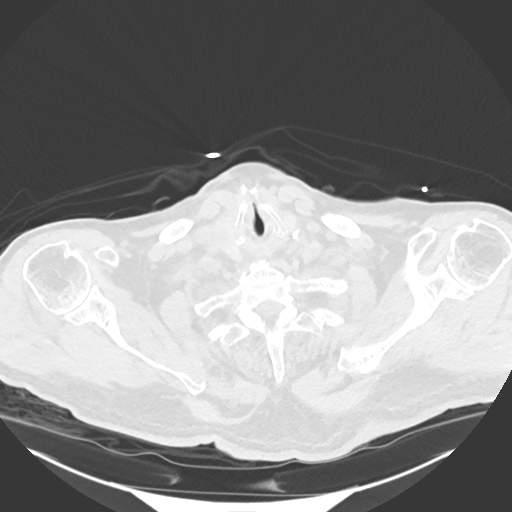
[im 154/167  lung]
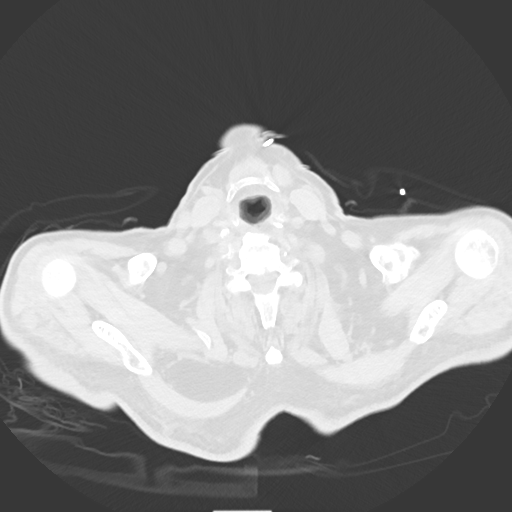

[Series 6: (person_name) · coronal · 0.64mm/px · 3 of 168 slices shown]
[im 34/168  lung]
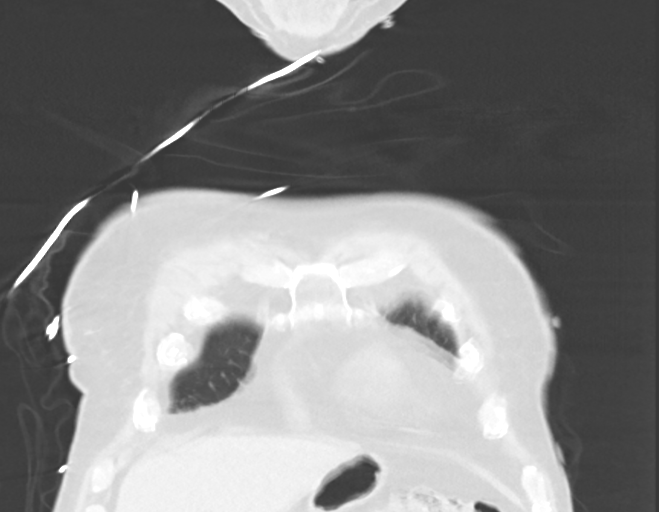
[im 67/168  lung]
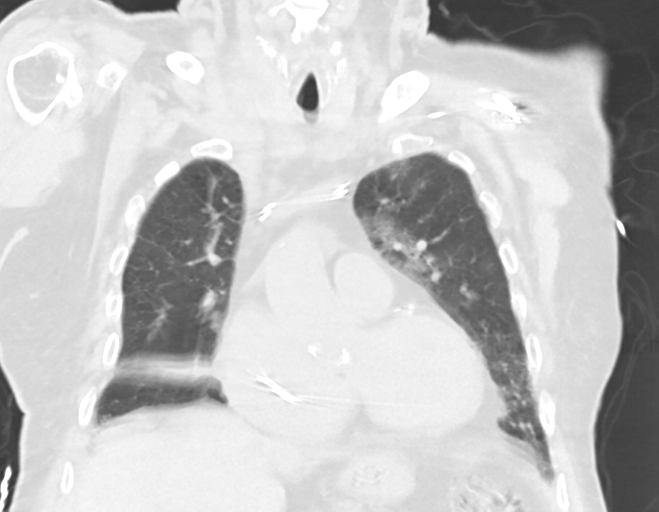
[im 101/168  lung]
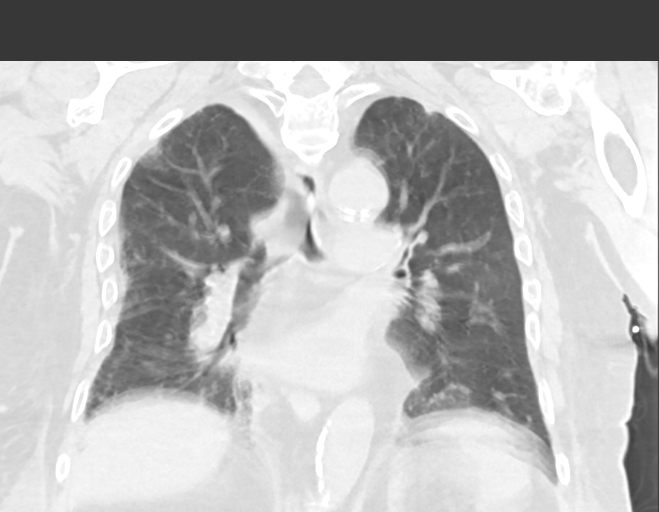

[15 of 36 positions shown; findings below may reference images not displayed]

FINDINGS: Cardiovascular: Cardiomegaly. Pacemaker in place. Extensive coronary
artery calcification. Aortic atherosclerotic calcification.

Mediastinum/Nodes: No mediastinal or hilar mass or lymphadenopathy.

Lungs/Pleura: Bilateral pleural effusions layering dependently.
Dependent pulmonary atelectasis. Non dependent lungs are well
aerated without evidence of active process. There is narrowing of
the trachea just proximal to the carina and narrowing of the
mainstem bronchi consistent with tracheobronchomalacia.

Upper Abdomen: Negative

Musculoskeletal: Old appearing compression deformities at T1 and T3.
No acute thoracic bone finding.
IMPRESSION: 1. Bilateral pleural effusions layering dependently. Dependent
pulmonary atelectasis.
2. Cardiomegaly. Extensive coronary artery calcification. Pacemaker
in place.
3. Narrowing of the trachea just proximal to the carina and
narrowing of the mainstem bronchi consistent with
tracheobronchomalacia.
4. Old appearing compression deformities at T1 and T3.
5. Aortic atherosclerosis.

Aortic Atherosclerosis ([id number]).

## 2021-04-30 IMAGING — DX DG CHEST 1V PORT
1 series · 1 of 1 positions shown · non-contrast
Comparison: 05/25/2020

CLINICAL DATA: Progressive shortness of breath

EXAM:
PORTABLE CHEST 1 VIEW

[chest ap]
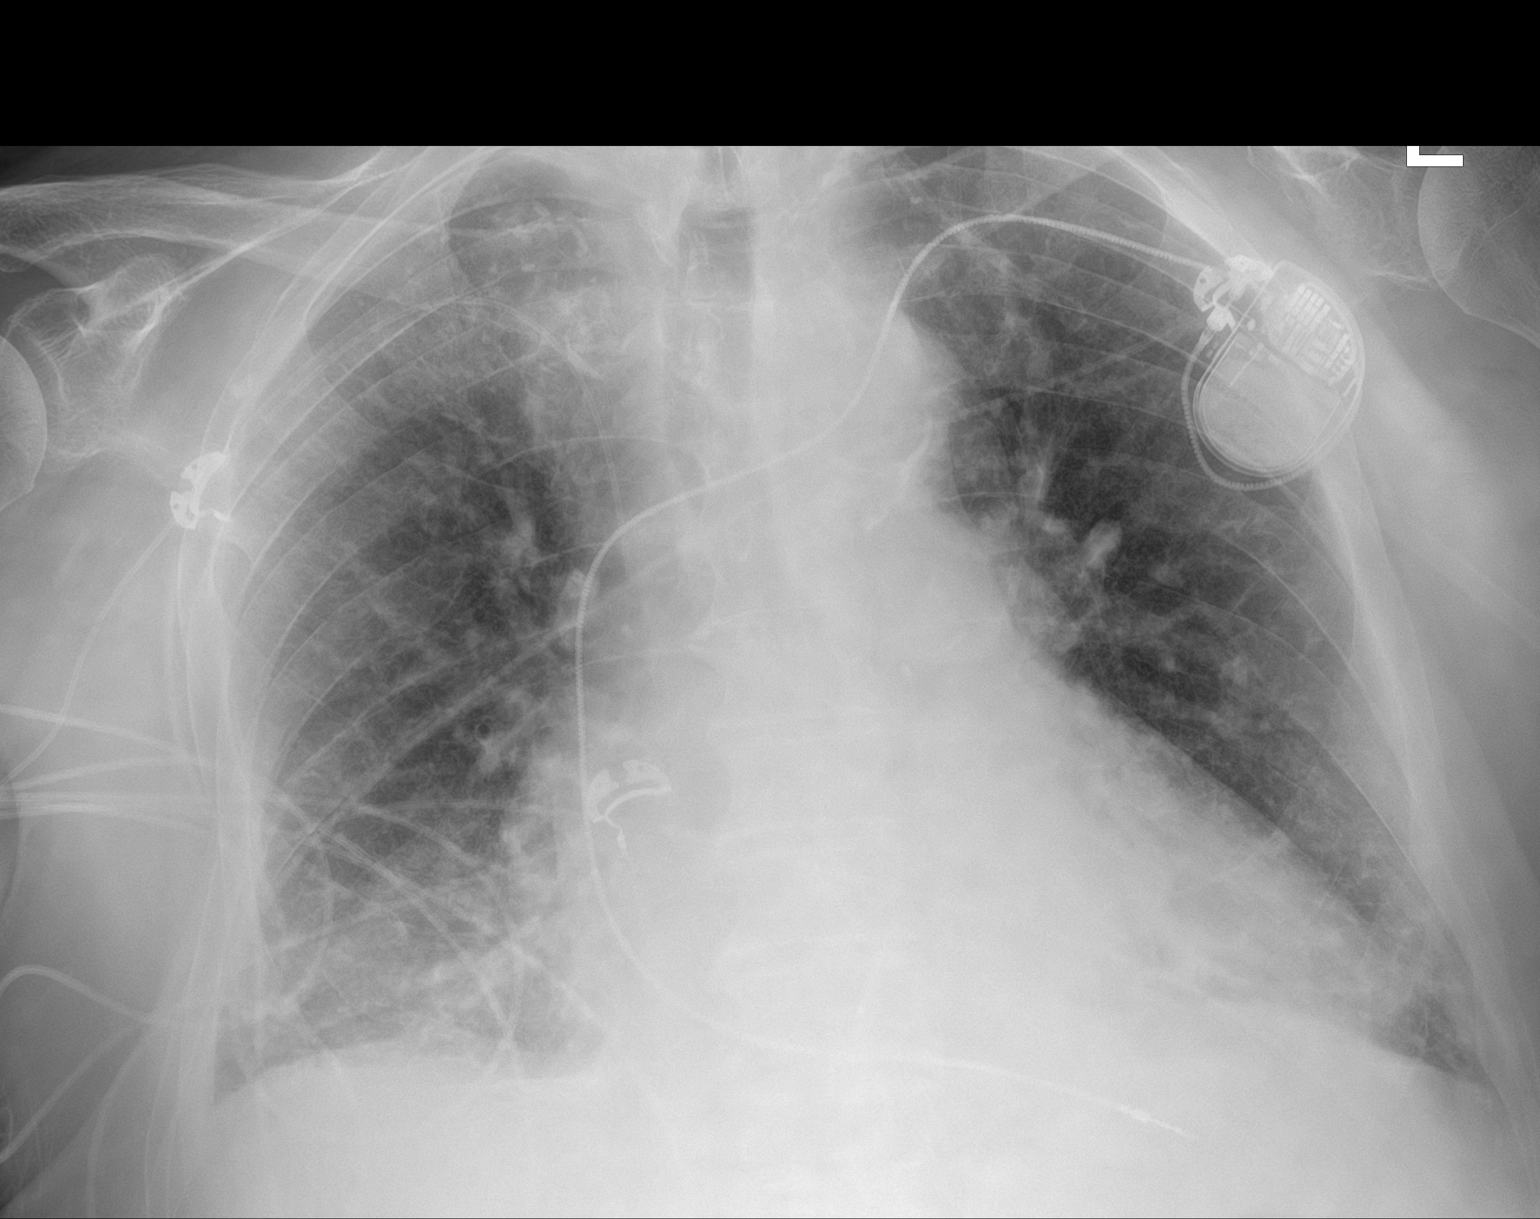

[1 of 1 positions shown; findings below may reference images not displayed]

FINDINGS: Right PICC line and left pacer remain in place, unchanged.
Cardiomegaly, vascular congestion. Bibasilar atelectasis. Findings
similar to prior study.
IMPRESSION: Cardiomegaly with vascular congestion.

Bibasilar atelectasis.

## 2022-02-21 DEATH — deceased
